# Patient Record
Sex: Male | Born: 1945
Health system: Southern US, Community
[De-identification: ages and names within clinical notes are randomized; demographics above are authoritative.]

## PROBLEM LIST (undated history)

## (undated) DIAGNOSIS — C61 Malignant neoplasm of prostate: Secondary | ICD-10-CM

## (undated) DIAGNOSIS — Z973 Presence of spectacles and contact lenses: Secondary | ICD-10-CM

## (undated) DIAGNOSIS — H409 Unspecified glaucoma: Secondary | ICD-10-CM

## (undated) DIAGNOSIS — I480 Paroxysmal atrial fibrillation: Secondary | ICD-10-CM

## (undated) DIAGNOSIS — E119 Type 2 diabetes mellitus without complications: Secondary | ICD-10-CM

## (undated) DIAGNOSIS — Z955 Presence of coronary angioplasty implant and graft: Secondary | ICD-10-CM

## (undated) DIAGNOSIS — M1990 Unspecified osteoarthritis, unspecified site: Secondary | ICD-10-CM

## (undated) DIAGNOSIS — R351 Nocturia: Secondary | ICD-10-CM

## (undated) DIAGNOSIS — I252 Old myocardial infarction: Secondary | ICD-10-CM

## (undated) HISTORY — PX: CORONARY ANGIOPLASTY: SHX604

## (undated) HISTORY — PX: CORONARY ANGIOPLASTY WITH STENT PLACEMENT: SHX49

---

## 1998-05-04 ENCOUNTER — Inpatient Hospital Stay (HOSPITAL_COMMUNITY): Admission: EM | Admit: 1998-05-04 | Discharge: 1998-05-06 | Payer: Self-pay | Admitting: Emergency Medicine

## 2000-11-22 ENCOUNTER — Observation Stay (HOSPITAL_COMMUNITY): Admission: EM | Admit: 2000-11-22 | Discharge: 2000-11-23 | Payer: Self-pay | Admitting: Emergency Medicine

## 2000-11-22 ENCOUNTER — Encounter: Payer: Self-pay | Admitting: Emergency Medicine

## 2000-12-12 ENCOUNTER — Encounter: Payer: Self-pay | Admitting: Cardiology

## 2000-12-12 ENCOUNTER — Ambulatory Visit (HOSPITAL_COMMUNITY): Admission: RE | Admit: 2000-12-12 | Discharge: 2000-12-12 | Payer: Self-pay | Admitting: Cardiology

## 2001-07-01 ENCOUNTER — Encounter: Payer: Self-pay | Admitting: Emergency Medicine

## 2001-07-01 ENCOUNTER — Inpatient Hospital Stay (HOSPITAL_COMMUNITY): Admission: EM | Admit: 2001-07-01 | Discharge: 2001-07-04 | Payer: Self-pay | Admitting: Emergency Medicine

## 2001-11-27 ENCOUNTER — Encounter: Payer: Self-pay | Admitting: Cardiology

## 2001-11-27 ENCOUNTER — Encounter: Admission: RE | Admit: 2001-11-27 | Discharge: 2001-11-27 | Payer: Self-pay | Admitting: Cardiology

## 2001-12-03 ENCOUNTER — Ambulatory Visit (HOSPITAL_COMMUNITY): Admission: RE | Admit: 2001-12-03 | Discharge: 2001-12-03 | Payer: Self-pay | Admitting: Cardiology

## 2001-12-04 ENCOUNTER — Inpatient Hospital Stay (HOSPITAL_COMMUNITY): Admission: EM | Admit: 2001-12-04 | Discharge: 2001-12-06 | Payer: Self-pay | Admitting: Emergency Medicine

## 2001-12-04 ENCOUNTER — Encounter: Payer: Self-pay | Admitting: Emergency Medicine

## 2002-05-06 ENCOUNTER — Encounter: Payer: Self-pay | Admitting: Cardiology

## 2002-05-06 ENCOUNTER — Ambulatory Visit (HOSPITAL_COMMUNITY): Admission: RE | Admit: 2002-05-06 | Discharge: 2002-05-06 | Payer: Self-pay | Admitting: Cardiology

## 2002-06-04 ENCOUNTER — Encounter (INDEPENDENT_AMBULATORY_CARE_PROVIDER_SITE_OTHER): Payer: Self-pay | Admitting: *Deleted

## 2002-06-04 ENCOUNTER — Ambulatory Visit (HOSPITAL_COMMUNITY): Admission: RE | Admit: 2002-06-04 | Discharge: 2002-06-04 | Payer: Self-pay | Admitting: General Surgery

## 2002-06-04 HISTORY — PX: OTHER SURGICAL HISTORY: SHX169

## 2002-09-10 ENCOUNTER — Ambulatory Visit (HOSPITAL_COMMUNITY): Admission: RE | Admit: 2002-09-10 | Discharge: 2002-09-11 | Payer: Self-pay | Admitting: Cardiology

## 2003-02-03 ENCOUNTER — Encounter: Payer: Self-pay | Admitting: General Practice

## 2003-02-03 ENCOUNTER — Encounter: Admission: RE | Admit: 2003-02-03 | Discharge: 2003-02-03 | Payer: Self-pay | Admitting: General Practice

## 2003-11-26 ENCOUNTER — Encounter: Admission: RE | Admit: 2003-11-26 | Discharge: 2003-11-26 | Payer: Self-pay | Admitting: General Practice

## 2006-06-07 ENCOUNTER — Ambulatory Visit (HOSPITAL_COMMUNITY): Admission: RE | Admit: 2006-06-07 | Discharge: 2006-06-07 | Payer: Self-pay | Admitting: Gastroenterology

## 2007-12-22 ENCOUNTER — Encounter: Admission: RE | Admit: 2007-12-22 | Discharge: 2007-12-22 | Payer: Self-pay | Admitting: Ophthalmology

## 2008-05-27 ENCOUNTER — Encounter: Admission: RE | Admit: 2008-05-27 | Discharge: 2008-05-27 | Payer: Self-pay | Admitting: Ophthalmology

## 2010-12-04 ENCOUNTER — Encounter: Payer: Self-pay | Admitting: Ophthalmology

## 2011-03-30 NOTE — Op Note (Signed)
NAMEJEFFREY, WURDEMAN              ACCOUNT NO.:  [id number]   MEDICAL RECORD NO.:  [id number]          PATIENT TYPE:  AMB   LOCATION:  ENDO                         FACILITY:  [facility]   PHYSICIAN:  James L. Rolla Flatten., M.D.DATE OF BIRTH:  Feb 27, 1946   DATE OF PROCEDURE:  06/07/2006  DATE OF DISCHARGE:                                 OPERATIVE REPORT   PROCEDURE:  Colonoscopy.   MEDICATIONS:  Fentanyl 75 mcg, Versed 7.5 mg IV.   INDICATIONS:  Previous history of large adenomatous colon polyp.   DESCRIPTION OF PROCEDURE:  Procedure explained to the patient and consent  obtained.  In the left lateral decubitus position, an Olympus scope was  inserted.  The prep was excellent.  We were able to reach the cecum without  difficulty.  Ileocecal valve and appendiceal orifice seen.  Scope withdrawn  through the cecum, ascending colon, transverse colon.  Descending and  sigmoid colon seen well.  No polyps or other lesions seen.  The rectum was  free of polyps; this was carefully examined in forward and retroflexed view.  Scope was withdrawn.   The patient tolerated the procedure well.   ASSESSMENT:  History of colon polyps, without any polyp seen at this time.   PLAN:  We will recommend repeating procedure in 5 years.           ______________________________  Joyice Faster Rolla Flatten., M.D.     Jaynie Bream  D:  06/07/2006  T:  06/08/2006  Job:  [id number]   cc:   [name], M.D.  Fax: [phone]   Allegra Lai. Terrence Dupont, M.D.  Fax: [phone]

## 2011-03-30 NOTE — Cardiovascular Report (Signed)
[place]. [facility]  Patient:    Corey Beard, Corey Beard Visit Number: [id number] MRN: [id number]          Service Type: MED Location: [id number] Attending Physician:  Clent Demark Dictated by:   Allegra Lai Terrence Dupont, M.D. Proc. Date: 12/04/01 Admit Date:  12/04/2001 Discharge Date: 12/06/2001   CC:         Cardiac Catheterization Laboratory  [name], M.D.   Cardiac Catheterization  PROCEDURES: 1. Left cardiac catheterization with selective left and right coronary    angiography, left ventriculography via the right groin using Judkins    technique. 2. Successful percutaneous transluminal coronary angioplasty to ostial of    diagonal #1 using 3.0 x 6 mm, long, Cutting Balloon. 3. Successful deployment of 3.0 x 8 mm, long, Zeta, Multi-Link stent in    ostium of diagonal #1. 4. Successful percutaneous transluminal coronary angioplasty to proximal    left anterior descending using 3.5 x 15 mm, long, CrossSail balloon.  INDICATIONS FOR PROCEDURE: The patient is a 65 year old, black male with a past medical history significant for coronary artery disease, status post inferior wall MI in June of 1999, status post PTCA and stenting to RCA and left circumflex in June of 1999, status post PTCA to diagonal #1, RCA, and PTCA and stenting to LAD in August of 2002. History of hypertension, morbid obesity, hypercholesterolemia, non-insulin dependent diabetes mellitus, controlled by diet. He came to the ER complaining of recurrent retrosternal, crushing, chest pain off and on for the last 2-3 weeks.  The patient has seen Dr. Montez Morita and was scheduled for Persantine Cardiolite today. He woke up with chest pain and cleaning the snow, developed severe chest pain radiating to the left arm associated with nausea and vomiting x1, grade 10/10, so desired to come to the ER. The patient received two sublingual nitroglycerin with relief of chest pain from 10 to 1/10  and was started on IV heparin and nitroglycerin drip with relief of chest pain. The patient states he was doing fine since his PTCA stenting in the last 2-3 weeks. Denies any palpitations, lightheadedness, or syncope. Denies PND, orthopnea, or leg swelling.  PAST MEDICAL HISTORY: As above.  PAST SURGICAL HISTORY: None.  ALLERGIES: No known drug allergies.  MEDICATIONS AT HOME: 1. Altace 10 mg p.o. q.d. 2. Tenormin 50 mg p.o. q.d. 3. Plavix 75 mg p.o. q.d. 4. Lipitor 10 mg p.o. q.d. 5. Enteric-coated aspirin 325 mg p.o. q.d. 6. Nitro-Dur 0.4 mg/h q.d. 7. Nitrostat sublingual p.r.n.  SOCIAL HISTORY: He is married and has one child, smoked one-third pack-per-day for 20+ years since August and recently started smoking again for the last few weeks. Drinks socially, occasionally practices medicine.  FAMILY HISTORY: Negative for coronary artery disease.  PHYSICAL EXAMINATION:  GENERAL: On examination, he was awake, alert, oriented x3 in no acute distress.  VITAL SIGNS: Blood pressure was 210/96, pulse was 94, regular.  HEENT: Conjunctiva was pink.  NECK: Supple, no JVD, no bruits.  LUNGS: Lungs are clear to auscultation without rhonchi or rales.  CARDIOVASCULAR: Cardiovascular examination: S1 and S2 were normal. There was as soft S4 gallop at the apex.  ABDOMEN: Soft, bowel sounds are present. Nontender.  EXTREMITIES: There was no clubbing, cyanosis or edema.  ECG showed normal sinus rhythm with septal Q waves and nonspecific T wave changes in the lateral leads.  Discussed with patient regarding various options of treatment, i.e., invasive left catheterization, possible PCI, stenting, its risks and benefits  versus stress testing. The patient consented for PCI.  DESCRIPTION OF PROCEDURE: After obtaining the informed consent, the patient was brought to the catheterization lab and was placed on the fluoroscopy table.  The right groin was prepped and draped in the usual  fashion. Xylocaine 2% was used for local anesthesia in the right groin. With the help of a thin-walled needle, a 6 French arterial sheath was placed. The sheath was aspirated and flushed. Next, a 66 French left Judkins catheter was advanced over the wire under fluoroscopic guidance up to the ascending aorta. The wire was pulled out, the catheter was aspirated and connected to the manifold. The catheter was further advanced and engaged into the left coronary ostium. Multiple views of the left system were taken. Next, the catheter was disengaged and was pulled out over the wire and was replaced with a 6 French right Judkins catheter, which was advanced over the wire under fluoroscopic guidance up to the ascending aorta. The wire was pulled out, the catheter was aspirated and connected to the manifold. The catheter was further advanced and engaged into the the right coronary ostium. Multiple views of the right system were taken. Next, the catheter was disengaged and was pulled out over the wire and was replaced with a 6 French pigtail catheter, which was advanced over the wire under fluoroscopic guidance up to the ascending aorta. The wire was pulled out. The catheter was aspirated and connected to the manifold. The catheter was further advanced across the aortic valve into the LV. LV pressures were recorded. Next, LV-graph was done in 30-degree RAO position. Post scintigraphic pressures were recorded from LV and then pullback pressures were recorded from the aorta. There was no gradient across the aortic valve. Next, the pigtail catheter was pulled out over the wire and the sheaths were aspirated and flushed.  FINDINGS: LV showed left ventricular systolic function, EF of [id number].  Left main was patent.  LAD had 70-75% in-stent re-stenosis. Diagonal #1 has 95% ostial stenosis.  The left circumflex has mild distal plaquing. OM-1 and OM-2 are patent.   RCA has 60-70% focal mid stenosis and  30-40% long tubular mid stenosis.  INTERVENTIONAL PROCEDURE: Successful PTCA to ostial of diagonal #1 was done using 2.0 x 6 mm, long, Cutting Balloon. Balloon ruptured after first inflation and then a 3.0 x 8 mm, long, Zeta, Multi-Link stent deployed at 8 atmospheres of pressure which was fully expanded going up to 9 atmospheres of pressure. The lesion was dilated from 95% to less than 10% residual with excellent TIMI grade 3 distal flow without evidence of dissection or distal embolization. Then successful PTCA to proximal LAD was done using 3.5 x 15 mm, long, CrossSail balloon, lesion dilated from 70-75% to less than 20%. Residual angiogram showed haziness in the proximal LAD and diagonal and then 3.0 x 10 mm, long, CrossSail and 3.5 x 15 mm, long, CrossSail balloon were used in diagonal #1 and LAD using kissing balloon technique. Both balloons were inflated at 2 and 4 atmospheres of pressure, respectively. Angiogram showed focal haziness in diagonal and then repeat PTCA to ostial of diagonal #1 with 3.0 x 10 mm CrossSail, and then 3.5 x 15 mm CrossSail balloon was done in diagonal and LAD. Lesions dilated from 95% to less than 10% residual in diagonal #1 and from 70% to less than 20% in LAD with excellent TIMI grade 3 distal flow in both the vessels. The patient received weight-based heparin, ReoPro, Plavix during the procedure. The  patient tolerated the procedure well. There were no complications. The patient was transferred to the recovery room in stable condition. Dictated by:   Allegra Lai Terrence Dupont, M.D. Attending Physician:  Clent Demark DD:  12/06/01 TD:  12/06/01 Job: [id number]

## 2011-03-30 NOTE — Discharge Summary (Signed)
[place]. [facility]  Patient:    Corey Beard, Corey Beard Visit Number: [id number] MRN: [id number]          Service Type: MED Location: [id number] Attending Physician:  Clent Demark Dictated by:   Allegra Lai Terrence Dupont, M.D. Adm. Date:  07/01/2001 Disc. Date: 07/04/2001   CC:         [name], M.D.   Discharge Summary  ADMISSION DIAGNOSES: 1. Non-Q wave myocardial infarction. 2. Coronary artery disease. 3. History of inferior wall myocardial infarction in June 1999. 4. Hypertension. 5. History of paroxysmal atrial fibrillation. 6. Tobacco abuse. 7. Hypercholesterolemia. 8. Morbid obesity. 9. Elevated blood sugar, rule out diabetes mellitus.  FINAL DIAGNOSES: 1. Non-Q wave myocardial infarction.  Status post percutaneous transluminal    coronary angioplasty/stenting to proximal left anterior descending.    Percutaneous transluminal coronary angioplasty to diagonal 1.  Percutaneous    transluminal coronary angioplasty to mid right coronary artery. 2. Coronary artery disease. 3. History of inferior wall myocardial infarction in June 1999. 4. Hypertension. 5. History of paroxysmal atrial fibrillation in the past. 6. Hypercholesterolemia. 7. Non-insulin-dependent diabetes mellitus controlled by diet. 8. Morbid obesity. 9. Tobacco abuse.  DISCHARGE MEDICATIONS: 1. Enteric-coated aspirin 325 mg 1 tablet daily. 2. Plavix 75 mg 1 tablet daily with food. 3. Atenolol 50 mg 1 tablet daily. 4. Altace 10 mg 1 capsule daily. 5. Nitro-Dur 0.4 mg per hour apply to chest wall in the a.m. and off at night. 6. Lipitor 10 mg 1 tablet daily. 7. Nitrostat 0.4 mg sublingual used as directed.  ACTIVITY:  Avoid heavy lifting, pushing, or pulling for 48 hours.  DIET:  Low-salt/low-cholesterol 1800 calorie ADA diet.  ______ and stent instructions have been given.  FOLLOW-UP:  Follow up with me in one week and Dr. Montez Morita in two weeks.  CONDITION ON  DISCHARGE:  Stable.  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  He came to the ER complaining of retrosternal crushing chest pain off and on for almost one month lasting 10-15 minutes with minimal exertion or walking to mailbox.  States for the last two days he has been having severe chest pain off and on lasting 20-30 minutes.  Yesterday, while taking the trash out, experienced chest pain, and today after eating lunch while in the office experienced chest pain lasting for 20-30 minutes relieved with rest.  The patient did not seek any medical attention until this evening and drove to the ER.  Presently, the patient denies any chest pain, nausea, vomiting, diaphoresis.  Denies palpitations, lightheadedness, or syncope.  PAST MEDICAL HISTORY:  As above.  PAST SURGICAL HISTORY:  None.  ALLERGIES:  None.  MEDICATIONS: 1. Atenolol 50 mg p.o. q.d. 2. Cardizem CD 240 mg p.o. q.d. 3. Enteric-coated aspirin 325 mg p.o. q.d. 4. Lipitor 10 mg p.o. q.d.  SOCIAL HISTORY:  He is married and has two children.  He smokes 1/3 pack per day for 20+ years.  Drinks alcohol socially.  Practices medicine.  FAMILY HISTORY:  Noncontributory for coronary artery disease.  PHYSICAL EXAMINATION:  GENERAL:  He was alert, awake, and oriented x 3 in no acute distress.  VITAL SIGNS:  Blood pressure 124/76, pulse 84 and regular.  HEENT:  Conjunctiva  was pink.  NECK:  Supple.  No JVD, no bruit.  LUNGS:  Clear to auscultation without rhonchi or rales.  CARDIOVASCULAR:  S1, S2 was normal.  There was no S3 gallop.  There was a soft S4 and soft systolic murmur present.  ABDOMEN:  Soft.  Bowel sounds were present.   Nontender.  EXTREMITIES:  There was no clubbing, cyanosis, or edema.  LABORATORY DATA:  His EKG showed normal sinus rhythm and nonspecific T-wave changes.  Chest x-ray showed thoracic spondylosis.  No active cardiopulmonary disease was apparent.  His CPK was 309, MB of 7.0, relative index of 2.3.  Second set:  CPK was 324, MB of 10.7, relative index of 3.3.  Third set:  CPK 288, MB of 6.9, relative index of 2.4.  Fourth set:  CPK 157, MB of 2.2, relative index 1.4.  Todays CPK is 132.  Troponin-I:  First set was elevated 0.78, second set was 3.64, third set 2.33, fourth set 1.35, fifth set is 0.63.  His cholesterol was 195, triglycerides 255, HDL 52, LDL 92.  His hemoglobin A1c was 7.2.  Electrolytes: Sodium 141, potassium 3.4, chloride 104, bicarb 24.  His blood sugar was 168. Fasting sugar the next day was 146.  Repeat fasting sugar on August 22 was 140.  BUN was 11, creatinine 1.1.  Today, BUN is 7 and creatinine 1.1.  His hemoglobin was 14.9, hematocrit 43.9, white count of 9.5, hemoglobin today is 12.9, hematocrit 37.4, which is stable.  Potassium is 3.4.  Glucose fasting was 133.  HOSPITAL COURSE:  The patient was admitted to CCU.  The patient ruled in for a non-Q wave MI by serial enzymes.  He had typical anginal pain.  The patient underwent PTCA and stenting to LAD and PTCA to diagonal and RCA, as per interventional procedure report.  The patient tolerated the procedure well. There were no complications.  The patient did not have any episodes of chest pain during the hospital stay.  Phase I cardiac rehab was called.  His groin is stable.  The patient has been ambulating in the hallway without any problems.  The patient, at this point, is not interested for Phase II cardiac rehab.  The patients cardiac enzymes have come back to normal range.  The patient will be discharged home on above medications and will be followed up by me in one week and thereafter by Dr.  Montez Morita.  Dictated by:   Allegra Lai Terrence Dupont, M.D. Attending Physician:  Clent Demark DD:  07/04/01 TD:  07/06/01 Job: [id number]

## 2011-03-30 NOTE — Cardiovascular Report (Signed)
[place]. [facility]  Patient:    Corey Beard, Corey Beard Visit Number: [id number] MRN: [id number]          Service Type: MED Location: [id number] Attending Physician:  Clent Demark Dictated by:   Allegra Lai Terrence Dupont, M.D. Proc. Date: 07/02/01 Adm. Date:  07/01/2001 Disc. Date: 07/04/2001   CC:         [name], M.D.  Cardiac Catheterization Laboratory   Cardiac Catheterization  PROCEDURES: 1. Left cardiac catheterization with selective left and right coronary    angiography, left ventriculography via right groin using Judkins technique. 2. Successful percutaneous transluminal coronary angioplasty to proximal    left anterior descending using 3.0 x 15 mm long CrossSail balloon. 3. Successful deployment of 3.5 x 18 mm long, Hepacoat, Bx Velocity stent    in proximal left anterior descending. 4. Successful percutaneous transluminal coronary angioplasty to ostium of    diagonal #1 using 3.0 x 10 mm long, CrossSail balloon. 5. Successful percutaneous transluminal coronary angioplasty to mid right    coronary artery using 3.0 x 15 mm long, Cutting Balloon.  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 Sherald Barge, history of hypertension, hypercholesterolemia, history of paroxysmal atrial fibrillation, morbid obesity, who came to the ER complainiNG of retrosternal pressure and chest pain off and on for almost one month, lasting 10-15 minutes with minimal exertion or walking to the mailbox or taking garbage out to the street.  He states that for the last two days he has had severe pain off and on lasting 20-30 minutes, yesterday when taking trash out experienced severe chest pain and today after  eating lunch while in office experienced chest pain lasting 20-30 minutes relieved with rest.  He did not seek any medical attention until this evening and drove to ER.  Presently, the patient denies any chest pain, nausea, vomiting, or diaphoresis.  Denies palpitations, lightheadedness or syncope.  Denies fever, chills or cough.  PAST MEDICAL HISTORY:  As above.  PAST SURGICAL HISTORY:  None.  ALLERGIES:  None.  MEDICATIONS:  He was on atenolol 50 mg p.o. q.d., Cardizem 240 p.o. q.d., enteric-coated aspirin 1 p.o. q.d., Lipitor 10 mg p.o. q.d.  SOCIAL HISTORY:  He is married and has two children.  Smokes one-third pack for 20+ years.  Drinks alcohol socially ______ actively ______.  FAMILY HISTORY:  Noncontributory for coronary artery disease.  PHYSICAL EXAMINATION:  On examination, he was awake, alert and oriented x3 in no acute distress.  Blood pressure was 124/76, pulse was 84.  Conjunctivae were pink.  Neck:  Supple, no JVD, no bruit.  Lungs are clear to auscultation with rhonchi or rales.  Cardiovascular examination: S1, S2 were normal.  There was a soft S4 gallop at the apex and soft systolic murmur at the apex. Abdomen:  Soft, bowel sounds are present, nontender.  Extremities:  There is no clubbing, cyanosis or edema.  His ECG showed normal sinus rhythm with nonspecific T wave changes.  His first set of CPK was already elevated.  CPK was 209, MB of 7.0, relative index of 2.3.  Troponin I, first set was also elevated.  It was 0.078.  The patient was admitted to CCU with a diagnosis of non-Q-wave MI and was started  on IV heparin, nitrates and beta blockers and antiplatelet medication. The patient did not have any episodes of chest pain during the hospital stay. Due to typical anginal pain, positive enzymes and multiple risk factors, the patient was advised for catheterization possible angioplasty.  DESCRIPTION OF PROCEDURE:  After obtaining the informed consent, the  patient was brought to the catheterization lab and was placed on the fluoroscopy table.  The right groin was prepped and draped in the usual fashion. Xylocaine 2% was used for local anesthesia in the right groin.  With the help of a thin-walled needle a 6 French arterial sheath was placed.  The sheath was aspirated and flushed.  Next, a 71 French left Judkins catheter was advanced over the wire under fluoroscopic guidance up to the ascending aorta.  The wire was pulled out, the catheter was aspirated and connected the manifold.  The catheter was further advanced and engaged into the left coronary ostium. Multiple views of the left system were taken.  Next, the catheter was disengaged and was pulled out over the wire and was replaced with a 6 French pigtail catheter which was advanced over the wire under fluoroscopic guidance up to the ascending aorta.  The wire was pulled out, the catheter was aspirated and connected to the manifold.  The catheter was further advanced and engaged into the right coronary ostium.  Multiple views of the right system were taken.  Next, the catheter was disengaged and was pulled out over the wire and was replaced with a 6 French pigtail catheter which was advanced over the wire under fluoroscopic guidance up to the ascending aorta.  The wire was pulled out, the catheter was aspirated and connected to the manifold.  The catheter was further advanced across the aortic valve into the LV.  LV pressures were recorded.  Next, LV-graphy was done in 30 degree RAO position. Postangiographic pressures were recorded from LV and then pullback pressures were recorded from the aorta.  There was no gradient across the aortic valve. Next, the pigtail catheter was pulled out over the wire.  The sheaths were aspirated and flushed.  FINDINGS:  LV showed mild inferior wall hypokinesis, ejection fraction of 50%.  Left main was patent.   LAD has 80-85% proximal complex  bifurcation stenosis with diagonal #1. Diagonal #1 has 20-30% ostial stenosis with post ______ aneurysmal dilatation.  Left circumflex has 10-15% distal stenosis.  OM-1 is moderate sized which is patent.  OM-2 is moderate sized which is patent.  The RCA has 90-95% in-stent long re-stenosis.  INTERVENTIONAL PROCEDURE:  Discussed with patient various options of treatment prior to PTCA, i.e., coronary artery bypass grafting versus PCI, its risks and benefits, i.e., death, MI, stroke, need for emergency CABG, risk of re-stenosis in the range of 40-50%, local vascular complications, etc.  The patient consented for PCI.  Successful PTCA of the proximal LAD was done using 3.0 x 15 mm long, CrossSail balloon using the above wire technique for pre-dilatation and then 3.5 x 18 mm long, Hepacoat stent Bx Velocity stent deployed at 10 atmospheres of pressure which was fully expanded going up to 11 atmospheres of pressure, lesion dilated from 90-95% and 0% residual with excellent TIMI grade 3 distal flow without evidence of dissection or distal embolization.  Angiogram showed 60-70% ostial diagonal #1 lesion.  Successful PTCA to ostial diagonal #1 was done using 3.0 x 10 mm long, CrossSail balloon, lesion dilated from 60-70% to less than 20% residual with excellent TIMI grade 3  distal flow without evidence of dissection or distal embolization.  Next, successful PTCA to mid RCA was done  using 3.0 x 15 mm long, Cutting Balloon, lesion dilated from 90-95% to less than 20% residual with excellent TIMI grade 3 distal flow without evidence of dissection or distal embolization.  The patient tolerated the procedure well.  There were no complications.  The patient received weight-based heparin, ReoPro during the procedure.  The patient was continued on aspirin and Plavix as before.  The patient was transferred to recovery room in stable condition. Dictated by:   Allegra Lai Terrence Dupont, M.D. Attending  Physician:  Clent Demark DD:  07/04/01 TD:  07/06/01 Job: [id number]

## 2011-03-30 NOTE — Cardiovascular Report (Signed)
NAME:  Corey Beard, Corey Beard                        ACCOUNT NO.:  [id number]   MEDICAL RECORD NO.:  [id number]                   PATIENT TYPE:  OIB   LOCATION:  6531                                 FACILITY:  [facility]   PHYSICIAN:  Mohan N. Terrence Dupont, M.D.              DATE OF BIRTH:  [birth year]   DATE OF PROCEDURE:  09/10/2002  DATE OF DISCHARGE:                              CARDIAC CATHETERIZATION   PROCEDURE:  1. Left cardiac catheterization with selective left and right coronary     angiography, left ventriculography via right groin using Judkins     technique.  2. Successful percutaneous transluminal coronary angioplasty to proximal     left anterior descending artery using 3.5 x 20-mm long CrossSail balloon.  3. Successful deployment of 3.5 x 28-mm long CYPHER drug-eluting stent in     proximal left anterior descending artery.   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  Subsequently had PTCA and stenting to proximal  LAD, PTCA to diagonal #1 and RCA in August 2002.  Subsequently had in-stent  restenosis to proximal LAD requiring PTCA and stenting to ostial of diagonal  #1 in January 2003.  History of hypertension, noninsulin-dependent diabetes  mellitus, hypercholesterolemia.  Past social history of alcohol abuse,  history of tobacco abuse.  Complained of retrosternal chest pain, described  as pressure, associated with exertion and relieved with rest and sublingual  nitroglycerin.  The patient denies any nausea, vomiting, or diaphoresis.  Denies rest pain or angina.  Denies PND, orthopnea, leg swelling.  Denies  palpitations, lightheadedness, or syncope.  The patient underwent Persantine  Cardiolite in June 2003 which showed focal scar in the anteroapical wall  with a small area of  ischemia in the anteroapical wall with an EF of 51%.  The patient opted initially for medical management as there was a small area  of ischemia in the anteroapical wall but due to recurrent progressive  exertional chest pain typical of angina, the patient consented for PCI.   DESCRIPTION OF PROCEDURE:  After obtaining informed consent, the patient was  brought to the catheterization lab and was placed on the fluoroscopy table.  The right groin was prepped and draped in the usual fashion.  Xylocaine, 2%,  was used for local anesthesia in the right groin.  With the help of a thin-  walled needle, a #6 French arterial sheath was placed.  The sheath was  aspirated and flushed.  Next a #6 [place] left Judkins catheter was advanced  over the wire under fluoroscopic guidance up to the ascending aorta.  The  wire was pulled out.  The catheter was aspirated and connected to the  manifold.  The catheter was further advanced and  engaged into the left  coronary ostium.  Multiple views of the left system were taken.  Next, the  catheter was disengaged and was pulled out over the wire and was replaced  with a #6 [place] right Judkins catheter which was advanced over the wire  under fluoroscopic guidance up to the ascending aorta.  The wire was pulled  out.  The catheter was aspirated and connected to the manifold.  The  catheter was further advanced and engaged into the left coronary ostium.  Multiple views of the left system were taken.  The catheter was engaged into  the right coronary ostium.  Multiple views of the right system were taken.  Next, the catheter was disengaged and was pulled out over the wire and was  replaced with a #6 French pigtail catheter which was advanced over the wire  under fluoroscopic guidance to the ascending aorta.  The wire was pulled  out.  The catheter was aspirated and connected to the manifold.  The  catheter was further advanced across the aortic valve into the LV.  LV   pressures were recorded.  Next, left ventriculography was done in 30-degree  RAO position.  Post angiographic pressures were recorded from the LV and  then pullback pressures were recorded from the aorta.  There was no gradient  across the aortic valve.  Next, the pigtail catheter was pulled out over the  wire.  Sheaths were aspirated and flushed.   FINDINGS:  1. LV showed mild anterolateral wall dyskinesia in the mid portion.  EF of     50-55%.  2. The left main was patent.  3. The LAD had 70-75% in-stent restenosis.  Diagonal #1 was 100% occluded.  4. The left circumflex had minimal plaquing.  OM-1 and OM-2 are patent.  5. The RCA had 40-50% mid stenosis as before.   INTERVENTIONAL PROCEDURE:  Successful PTCA to proximal LAD was done using  3.5 x 20-mm long CrossSail balloon for predilatation and then 3.5 x 28-mm  long CYPHER drug-eluting stent was deployed at 10 atmospheres of pressure  which was fully expanded going up to 13 atmospheres of pressure.  The lesion  was dilated from 70-75% to 0% residual with excellent TIMI grade 3 distal  flow without evidence of dissection or distal embolization.  The patient  received weight-based heparin, Integrilin, and a total of 300 mg of Plavix  during the procedure.  The patient tolerated the procedure well.  There were  no complications.  The patient was transferred to the recovery room in  stable condition.                                               Allegra Lai. Terrence Dupont, M.D.    MNH/MEDQ  D:  09/10/2002  T:  09/10/2002  Job:  [id number]   cc:   Cardiac Catheterization Lab

## 2011-03-30 NOTE — Op Note (Signed)
[place]  Patient:    Corey Beard, Corey Beard Visit Number: [id number] MRN: [id number]          Service Type: DSU Location: Center Line 05 Attending Physician:  Sherolyn Buba Dictated by:   Candee Furbish. Bubba Camp, M.D. Proc. Date: 06/04/02 Admit Date:  06/04/2002 Discharge Date: 06/04/2002                             Operative Report  PREOPERATIVE DIAGNOSES: 1. Incarcerated epigastric hernia. 2. Lymphadenopathy of the left occipitoparietal scalp.  POSTOPERATIVE DIAGNOSES: 1. Incarcerated epigastric hernia. 2. Lymphadenopathy of the left occipitoparietal scalp.  PROCEDURE: 1. Repair of ventral hernia with mesh. 2. Excision of mass of left occipitoparietal scalp.  SURGEON:  Candee Furbish. Bubba Camp, M.D.  ASSISTANT:  Nurse.  ANESTHESIA:  General.  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  This has been causing some degree of discomfort.  He comes now for repair of this incarcerated hernia after the risks and potential benefits of surgery had been fully discussed, all questions answered and consent obtained.  Additionally, the patient has a large mobile lymph node just above the mastoid in the occipitoparietal area of the left side of the scalp for which he requests removal.  PROCEDURE:  Following the induction of satisfactory general anesthesia with the patient positioned supinely, the abdomen was doubly prepped and draped to be included in the sterile operative field.  I made a vertical incision over the mass deepening this through the skin and subcutaneous tissues carrying the dissection down to a large incarcerated hernia sac.  This was dissected free in all sides down to the fascia.  The fascia was scored and opened so as to deliver the mass.  The mass of the entire sac was incarcerated with omentum.  The incarcerated omentum was divided between clamps and  secured with ties of 2-0 silk and the sac along with the incarcerated omentum was forwarded for pathologic evaluation.  Hemostasis was assured on the remaining omentum which was allowed to retract back into the abdominal cavity.  Within the abdominal cavity, I then placed a Seprafilm slurry to prevent adhesions.  I then placed a medium polypropylene mesh plug which was sewn in with 0 Novofil sutures completing the hernial repair.  The repair was noted to be intact.  Sponge, instrument and sharp counts were doubly verified.  The subcutaneous tissues were closed over the mesh with interrupted 2-0 Vicryl sutures.  The skin was closed with a running 4-0 monocryl.  The wound was reinforced with Steri-Strips.  Attention was then turned to the mass on the scalp where his head was turned to the right and the region just above and posterior to the ear prepped and draped to be included in the sterile operative field.  I infiltrated this area with 0.5% Marcaine with epinephrine 1:200,000 and then made a transverse incision over the mass deepening through the scalp and subcutaneous tissues down to the mass.  The mass was then dissected free on all sides maintaining hemostasis with electrocautery.  It was removed in its entirety and sent fresh to pathology.  Hemostasis was then assured.  Sponge, instrument and sharp counts were again verified.  Subcutaneous tissues closed with interrupted 3-0 Vicryl sutures and skin closed with running 4-0 monocryl.  This was reinforced with Steri-Strips and sterile dressings applied.  Anesthesia reversed.  The patient  was moved from the operating room to the recovery room in stable condition.  He tolerated the procedure well. Dictated by:   Candee Furbish. Bubba Camp, M.D. Attending Physician:  Sherolyn Buba DD:  06/04/02 TD:  06/07/02 Job: [id number]

## 2011-03-30 NOTE — Discharge Summary (Signed)
[place]  Patient:    Corey Beard, Corey Beard Visit Number: [id number] MRN: [id number]          Service Type: MED Location: [id number] Attending Physician:  Clent Demark Dictated by:   Allegra Lai Terrence Dupont, M.D. Admit Date:  12/04/2001 Discharge Date: 12/06/2001   CC:         [name], M.D.   Discharge Summary  ADMITTING DIAGNOSES: 1. Unstable angina, rule out myocardial infarction, rule out restenosis. 2. Coronary artery disease, status post PTCA and stenting to left anterior    descending artery and PTCA to right coronary artery and diagonal 1    vessels in August of 2002. a. History of myocardial infarction x2 in June of    1999 and August 2002. 3. Uncontrolled hypertension. 4. Non-insulin dependent diabetes mellitus controlled by diet. 5. Tobacco abuse. 6. Morbid obesity. 7. Hypercholesterolemia. 8. History of paroxysmal atrial fibrillation.  FINAL DIAGNOSES: 1. Status post small non-Q-wave myocardial infarction, status post PTCA and    stenting to diagonal 1 and PTCA to left anterior descending artery. 2. Coronary artery disease, status post PTCA and stenting to left anterior    descending artery and PTCA to right coronary artery and diagonal 1 in    August of 2002. 3. History of myocardial infarction x2 in the past, had PTCA and stenting to    right coronary artery and PTCA to left circumflex in June of 1999. 4. Uncontrolled hypertension. 5. Non-insulin dependent diabetes mellitus. 6. Morbid obesity. 7. Hypercholesterolemia. 8. Tobacco abuse. 9. History of paroxysmal atrial fibrillation.  MEDICATIONS: 1. Atenolol 50 mg one tablet daily. 2. Altace 10 mg one capsule twice daily. 3. Norvasc 5 mg one tablet daily. 4. Nitro-Dur 0.4 mg, apply to chest wall in the a.m., off at night. 5. Glyburide 75 mg one tablet daily with food. 6. Enteric-coated aspirin 325 mg one tablet daily. 7. Lipitor 20 mg one tablet daily. 8.  Actos 15 mg one tablet daily in the morning. 9. Nitrostat 0.4 mg sublingual use as directed.  ACTIVITY:  Avoid heavy lifting, pushing, or pulling for 48 hours.  DIET:  Low salt, low cholesterol, 1800 calories, ADA diet.  SPECIAL INSTRUCTIONS:  Post angioplasty and stent instructions have been given.  The patient has been advised to monitor blood sugar.  FOLLOW-UP:  Follow up with me in one week and Dr. Montez Morita in two weeks.  CONDITION ON DISCHARGE:  Stable.  BRIEF HISTORY/HOSPITAL COURSE:  The patient is a 65 year old black man with past medical history significant for coronary artery disease, status post inferior wall MI in June of 1999, status post PTCA and stenting to RCA and left circumflex in 1999, status post PTCA to diagonal 1 and RCA and PTCA and stenting to LAD in August of 2002.  He came to the ER with history of hypertension, morbid obesity, hypercholesterolemia, and history of paroxysmal atrial fibrillation, history of tobacco abuse, non-insulin dependent diabetes mellitus controlled by diet.  He came to the ER complaining of recurrent retrosternal crushing chest pain off and on for the last 2-3 weeks. He has seen Dr. Montez Morita and was sent in for Persantine Cardiolite today.  The patient woke up with chest pain and while cleaning snow, developed severe crushing chest pain radiating to the left arm associated with nausea and vomiting x1.  Pain was rated 10/10.  He took two sublingual nitroglycerin with partial relief and decided to come to the ER.  The patient was started  on IV heparin and nitrates with relief of chest pain.  The patient states he was doing fine until PTCA and stenting until last 2-3 weeks.  He denies any palpation, lightheadedness, or syncope.  He denies pain, PND, orthopnea or leg swelling.  PAST MEDICAL HISTORY:  As above.  PAST SURGICAL HISTORY:  None.  ALLERGIES:  None.  MEDICATIONS AT HOME: 1. He was on Altace 10 mg p.o. q.d. 2. Tenormin 50 mg  p.o. q.d. 3. Plavix 75 mg p.o. q.d. 4. Lipitor 10 mg p.o. q.d. 5. Enteric-coated aspirin 325 mg p.o. q.d. 6. Nitro-Dur 0.4 mg per hour q.d. 7. Nitrostat sublingual p.r.n.  SOCIAL HISTORY:  He is married and has one child.  He smokes one third pack per day for 20+ years.  He quit in August of 2002.  He states recently again he started smoking in the last two weeks.  He drinks socially occasionally and practices medicine.  FAMILY HISTORY:  Negative for coronary artery disease.  PHYSICAL EXAMINATION:  GENERAL:  He was alert, cooperative, oriented x 3, in no acute distress.  VITAL SIGNS:  Blood pressure was 210/96, pulse was 94 and regular.  HEENT:  Conjunctivae were pink.  NECK:  Supple, no JVD, no bruits.  LUNGS:  Clear to auscultation without rhonchi or rales.  CARDIOVASCULAR:  S1, S2 was normal.  There was soft S4 gallop at the apex.  ABDOMEN:  Soft, bowel sounds are present.  Nontender.  EXTREMITIES:  There was no clubbing, cyanosis, or edema.  LABORATORY:  His EKG showed normal sinus rhythm with septal Q-waves and nonspecified T-wave changes in the lateral lead.  Repeat EKG showed normal sinus rhythm with MVH and septal Q-waves and nonspecified T-wave changes in the lateral leads.  Repeat EKG on January 24 showed normal sinus rhythm. Impression was improved in the anterior leads and T-wave inversion in the lateral leads.  Chest x-ray showed cardiomegaly, no active disease.  His hemoglobin A1c was 7.6.  Cholesterol was 163, LDL was 65.  Actually there were two lipid panels.  The first lipid panel, cholesterol was 192, triglyceride 97, LDL of 115.  Second set was total cholesterol 163, LDL 65, HDL of 36.  Cardiac enzymes, first set CPK was 160, MB of 1.8, relative index of 1.1.  Second set, CPK was 205, MB of 8.4, and relative index 4.5, third set total CPK is 107, MB of 2.1.  Troponin I first set was 0.04, second set was  1.99, third set is 0.99.  His sodium was 136,  potassium was 4.0, chloride 103, blood sugar was 257, repeat blood sugar was 153, BUN 13, creatinine 0.8.  Hemoglobin was 14.6, hematocrit 42.2, white count of 10.9.  Repeat hemoglobin on January 24 was 11.7, hematocrit 34.2, white count of 8.6. Hemoglobin today is 12, hematocrit 35, white count of 7.2.  Platelet count is 244.  BRIEF HOSPITAL COURSE:  The patient was directly taken to the cath lab from the emergency room and had PTCA and stenting to diagonal 1 and PTCA to LAD as per procedure report.  The patient tolerated procedure well.  There were no complications.  The patient was transferred to postoperative recovery unit. The sheaths were pulled out the same day.  The patient did not have any source of chest pain during the hospital stay and his groin remained stable.  The patient has been ambulating in the hallway without any problems.  The patient did rule in for small non-Q-wave MI due to very mildly  elevated CPKs and troponin.  The patient stayed in the hospital overnight.  The patient was pain free since his hospital admission.  Phase 1 of cardiac rehab was called.  The patient at this point is not interested for phase 2 of cardiac rehab.  The patient will be discharged home on the above medications and will be followed up in my office in one week and by Dr. Montez Morita in two weeks.  The patient has been advised to go for Persantine Cardiolite due to treatments for follow-up of focal RCA lesion. Dictated by:   Allegra Lai Terrence Dupont, M.D. Attending Physician:  Clent Demark DD:  12/06/01 TD:  12/07/01 Job: [id number]

## 2011-07-16 ENCOUNTER — Emergency Department (HOSPITAL_COMMUNITY)
Admission: EM | Admit: 2011-07-16 | Discharge: 2011-07-16 | Discharge status: Against Medical Advice | Payer: BC Managed Care – PPO | Attending: Emergency Medicine | Admitting: Emergency Medicine

## 2011-07-16 DIAGNOSIS — F1010 Alcohol abuse, uncomplicated: Secondary | ICD-10-CM | POA: Insufficient documentation

## 2011-07-16 DIAGNOSIS — R5383 Other fatigue: Secondary | ICD-10-CM | POA: Insufficient documentation

## 2011-07-16 DIAGNOSIS — R5381 Other malaise: Secondary | ICD-10-CM | POA: Insufficient documentation

## 2011-07-16 DIAGNOSIS — I10 Essential (primary) hypertension: Secondary | ICD-10-CM | POA: Insufficient documentation

## 2011-07-16 DIAGNOSIS — E7800 Pure hypercholesterolemia, unspecified: Secondary | ICD-10-CM | POA: Insufficient documentation

## 2011-07-16 DIAGNOSIS — R55 Syncope and collapse: Secondary | ICD-10-CM | POA: Insufficient documentation

## 2011-07-16 DIAGNOSIS — Z79899 Other long term (current) drug therapy: Secondary | ICD-10-CM | POA: Insufficient documentation

## 2011-07-16 DIAGNOSIS — I252 Old myocardial infarction: Secondary | ICD-10-CM | POA: Insufficient documentation

## 2011-07-16 DIAGNOSIS — R4789 Other speech disturbances: Secondary | ICD-10-CM | POA: Insufficient documentation

## 2011-07-16 DIAGNOSIS — E119 Type 2 diabetes mellitus without complications: Secondary | ICD-10-CM | POA: Insufficient documentation

## 2011-07-16 LAB — DIFFERENTIAL
Basophils Absolute: 0 K/uL (ref 0.0–0.1)
Basophils Relative: 0 % (ref 0–1)
Eosinophils Absolute: 0.1 10*3/uL (ref 0.0–0.7)
Eosinophils Relative: 1 % (ref 0–5)
Lymphocytes Relative: 25 % (ref 12–46)
Lymphs Abs: 2.5 10*3/uL (ref 0.7–4.0)
Monocytes Absolute: 0.6 10*3/uL (ref 0.1–1.0)
Monocytes Relative: 6 % (ref 3–12)
Neutro Abs: 6.7 K/uL (ref 1.7–7.7)
Neutrophils Relative %: 68 % (ref 43–77)

## 2011-07-16 LAB — BASIC METABOLIC PANEL WITH GFR
Chloride: 100 meq/L (ref 96–112)
GFR calc Af Amer: >60 mL/min (ref >60)
Potassium: 3.8 meq/L (ref 3.5–5.1)

## 2011-07-16 LAB — BASIC METABOLIC PANEL
BUN: 15 mg/dL (ref 6–23)
CO2: 25 mEq/L (ref 19–32)
Calcium: 9 mg/dL (ref 8.4–10.5)
Creatinine, Ser: 1.28 mg/dL (ref 0.50–1.35)
GFR calc non Af Amer: 57 mL/min — ABNORMAL LOW (ref >60)
Glucose, Bld: 78 mg/dL (ref 70–99)
Sodium: 138 mEq/L (ref 135–145)

## 2011-07-16 LAB — CBC
HCT: 37 % — ABNORMAL LOW (ref 39.0–52.0)
Hemoglobin: 12.9 g/dL — ABNORMAL LOW (ref 13.0–17.0)
MCH: 29.8 pg (ref 26.0–34.0)
MCHC: 34.9 g/dL (ref 30.0–36.0)
MCV: 85.5 fL (ref 78.0–100.0)
Platelets: 332 10*3/uL (ref 150–400)
RBC: 4.33 MIL/uL (ref 4.22–5.81)
RDW: 16.9 % — ABNORMAL HIGH (ref 11.5–15.5)
WBC: 9.9 10*3/uL (ref 4.0–10.5)

## 2015-01-26 DIAGNOSIS — E0590 Thyrotoxicosis, unspecified without thyrotoxic crisis or storm: Secondary | ICD-10-CM | POA: Diagnosis not present

## 2015-01-26 DIAGNOSIS — H25813 Combined forms of age-related cataract, bilateral: Secondary | ICD-10-CM | POA: Diagnosis not present

## 2015-01-26 DIAGNOSIS — H4011X2 Primary open-angle glaucoma, moderate stage: Secondary | ICD-10-CM | POA: Diagnosis not present

## 2015-02-24 DIAGNOSIS — R351 Nocturia: Secondary | ICD-10-CM | POA: Diagnosis not present

## 2015-02-24 DIAGNOSIS — R972 Elevated prostate specific antigen [PSA]: Secondary | ICD-10-CM | POA: Diagnosis not present

## 2015-02-24 DIAGNOSIS — R350 Frequency of micturition: Secondary | ICD-10-CM | POA: Diagnosis not present

## 2015-02-24 DIAGNOSIS — N401 Enlarged prostate with lower urinary tract symptoms: Secondary | ICD-10-CM | POA: Diagnosis not present

## 2015-03-23 ENCOUNTER — Other Ambulatory Visit: Payer: Self-pay | Admitting: *Deleted

## 2015-03-23 ENCOUNTER — Ambulatory Visit
Admission: RE | Admit: 2015-03-23 | Discharge: 2015-03-23 | Disposition: A | Discharge status: Home / Self Care | Payer: No Typology Code available for payment source | Source: Ambulatory Visit | Attending: *Deleted | Admitting: *Deleted

## 2015-03-23 DIAGNOSIS — R7611 Nonspecific reaction to tuberculin skin test without active tuberculosis: Secondary | ICD-10-CM

## 2015-06-07 DIAGNOSIS — C61 Malignant neoplasm of prostate: Secondary | ICD-10-CM | POA: Diagnosis not present

## 2015-06-07 DIAGNOSIS — R972 Elevated prostate specific antigen [PSA]: Secondary | ICD-10-CM | POA: Diagnosis not present

## 2015-06-14 DIAGNOSIS — C61 Malignant neoplasm of prostate: Secondary | ICD-10-CM | POA: Diagnosis not present

## 2015-07-05 ENCOUNTER — Encounter: Payer: Self-pay | Admitting: Radiation Oncology

## 2015-07-05 NOTE — Progress Notes (Signed)
GU Location of Tumor / Histology: Adenocarcinoma of the Prostate   If Prostate Cancer, Gleason Score is ( + ) and PSA is (9.83)  Corey Beard presented:    Past/Anticipated interventions by urology, if any: Dr. Lowella Bandy - Biopsy of Prostate   Past/Anticipated interventions by medical oncology, if any: Unknown  Weight changes, if any: Reports 60 lbs in last 2-3 years  Bowel/Bladder complaints, if any: Nocturia x 2-3, Frequency and urgency  Nausea/Vomiting, if any:  No  Pain issues, if any: No   SAFETY ISSUES:  Prior radiation? No  Pacemaker/ICD? No  Possible current pregnancy? N/A  Is the patient on methotrexate?No  Current Complaints / other details:

## 2015-07-06 ENCOUNTER — Encounter: Payer: Self-pay | Admitting: Radiation Oncology

## 2015-07-06 ENCOUNTER — Ambulatory Visit
Admission: RE | Admit: 2015-07-06 | Discharge: 2015-07-06 | Disposition: A | Discharge status: Home / Self Care | Payer: Medicare Other | Source: Ambulatory Visit | Attending: Radiation Oncology | Admitting: Radiation Oncology

## 2015-07-06 VITALS — BP 153/79 | HR 51 | Temp 98.4°F | Ht 72.0 in | Wt 209.4 lb

## 2015-07-06 DIAGNOSIS — E119 Type 2 diabetes mellitus without complications: Secondary | ICD-10-CM | POA: Diagnosis not present

## 2015-07-06 DIAGNOSIS — C61 Malignant neoplasm of prostate: Secondary | ICD-10-CM | POA: Diagnosis not present

## 2015-07-06 DIAGNOSIS — I252 Old myocardial infarction: Secondary | ICD-10-CM | POA: Diagnosis not present

## 2015-07-06 DIAGNOSIS — I10 Essential (primary) hypertension: Secondary | ICD-10-CM | POA: Diagnosis not present

## 2015-07-06 DIAGNOSIS — I4891 Unspecified atrial fibrillation: Secondary | ICD-10-CM | POA: Diagnosis not present

## 2015-07-06 DIAGNOSIS — F17210 Nicotine dependence, cigarettes, uncomplicated: Secondary | ICD-10-CM | POA: Insufficient documentation

## 2015-07-06 HISTORY — DX: Malignant neoplasm of prostate: C61

## 2015-07-06 NOTE — Progress Notes (Addendum)
[facility] NEW PATIENT EVALUATION  Name: Corey Beard MRN: [id number]  Date:   07/06/2015           DOB: [birth year]  Status: outpatient   CC: No primary care provider on file.  Corey Bandy, MD    REFERRING PHYSICIAN: Lowella Bandy, MD   DIAGNOSIS:  Stage TIc favorable risk adenocarcinoma prostate   HISTORY OF PRESENT ILLNESS:  Corey Beard is a 69 y.o. male who is  Seen today through the courtesy of Dr. Janice Norrie for consideration of radiation therapy in the management of his stage TIc favorable risk adenocarcinoma prostate. His PSA was 7.5 this past March when up to 9.83 this past April with a 8% free PSA. Dr. Janice Norrie performed ultrasound-guided biopsies on 06/07/2015.  He was found have Gleason 6 (3+3) involving less than 5% of one core from the left lateral base, 10% of one core from the left base, and 10% of one core from the right lateral apex. His gland volume was 61 mL. He is doing well from a GU and GI standpoint.  His I PSS score is 7 -8. He is potent.  PREVIOUS RADIATION THERAPY: No   PAST MEDICAL HISTORY:  has a past medical history of Atrial fibrillation; Cardiac disorder; Diabetes mellitus; Glaucoma; Hypertension; Myocardial infarction; and Prostate cancer.     PAST SURGICAL HISTORY:  Past Surgical History  Procedure Laterality Date  . Cardiac catheterization    . Umbilical hernia repair       FAMILY HISTORY: family history includes Colon cancer in his mother.   His mother died at age 84 from colon cancer.  His father died from Parkinson's disease/or lesions 60. No family history of prostate cancer.   SOCIAL HISTORY:  reports that he has been smoking.  He does not have any smokeless tobacco history on file. He reports that he does not drink alcohol or use illicit drugs.   Married , 2 children.  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   ALLERGIES: Review of patient's allergies indicates no active allergies.   MEDICATIONS:  Current  Outpatient Prescriptions  Medication Sig Dispense Refill  . Amlodipine-Olmesartan (AZOR PO) Take by mouth.    Marland Kitchen aspirin 81 MG tablet Take 81 mg by mouth daily.    Marland Kitchen atenolol (TENORMIN) 50 MG tablet Take 50 mg by mouth daily.    . sitaGLIPtin-metformin (JANUMET) 50-1000 MG per tablet Take 1 tablet by mouth daily.     No current facility-administered medications for this encounter.     REVIEW OF SYSTEMS:  Pertinent items are noted in HPI.    PHYSICAL EXAM:  height is 6' (1.829 m) and weight is 209 lb 6.4 oz (94.983 kg). His temperature is 98.4 F (36.9 C). His blood pressure is 153/79 and his pulse is 51.    alert and oriented  69 year old after American male appearing his stated age.  Rectal examination: Prostate gland is slightly enlarged and is without focal induration or nodularity.   LABORATORY DATA:  Lab Results  Component Value Date   WBC 9.9 07/16/2011   HGB 12.9* 07/16/2011   HCT 37.0* 07/16/2011   MCV 85.5 07/16/2011   PLT 332 07/16/2011   Lab Results  Component Value Date   NA 138 07/16/2011   K 3.8 07/16/2011   CL 100 07/16/2011   CO2 25 07/16/2011   No results found for: ALT, AST, GGT, ALKPHOS, BILITOT   PSA 9.83 from 02/24/2015   IMPRESSION:  Stage  TIc favorable risk adenocarcinoma prostate.  I explained to Dr. Amadeo Garnet  That his prognosis is related to his stage, PSA level, and Gleason score.  All are favorable.  Other prognostic factors include PSA doubling time and disease volume.  He has low disease volume. We discussed the fact that he has multiple options.  These include surgery, active surveillance , or radiation therapy.  Radiation therapy options include seed implantation alone or external beam/IMRT. We spent a good deal of time discussing each radiation therapy option including radiation safety issues with seed implantation. We discussed the potential acute and late toxicities of radiation therapy.  His prognosis is favorable. He'll go home and think things  over and contact me if he wants to proceed with radiation therapy. I explained to him that he would need a CT arch study if he wants to proceed with seed implantation.  If his gland is too large then he may need to be downsized with 3 months of androgen deprivation therapy.   PLAN:  As discussed above.  I spent 45 minutes face to face with the patient and more than 50% of that time was spent in counseling and/or coordination of care.

## 2015-07-07 ENCOUNTER — Encounter: Payer: Self-pay | Admitting: Radiation Oncology

## 2015-07-07 NOTE — Progress Notes (Signed)
CC: Dr. Lowella Bandy  Dr. Amadeo Garnet called today and he would like to proceed with seed implantation.  I will have him come in next week for a CT arch study.

## 2015-07-12 DIAGNOSIS — I2510 Atherosclerotic heart disease of native coronary artery without angina pectoris: Secondary | ICD-10-CM | POA: Diagnosis not present

## 2015-07-12 DIAGNOSIS — N529 Male erectile dysfunction, unspecified: Secondary | ICD-10-CM | POA: Diagnosis not present

## 2015-07-12 DIAGNOSIS — E119 Type 2 diabetes mellitus without complications: Secondary | ICD-10-CM | POA: Diagnosis not present

## 2015-07-12 DIAGNOSIS — E785 Hyperlipidemia, unspecified: Secondary | ICD-10-CM | POA: Diagnosis not present

## 2015-07-12 DIAGNOSIS — I10 Essential (primary) hypertension: Secondary | ICD-10-CM | POA: Diagnosis not present

## 2015-07-12 DIAGNOSIS — Z8546 Personal history of malignant neoplasm of prostate: Secondary | ICD-10-CM | POA: Diagnosis not present

## 2015-07-26 ENCOUNTER — Ambulatory Visit
Admission: RE | Admit: 2015-07-26 | Discharge: 2015-07-26 | Disposition: A | Discharge status: Home / Self Care | Payer: Medicare Other | Source: Ambulatory Visit | Attending: Radiation Oncology | Admitting: Radiation Oncology

## 2015-07-26 VITALS — Wt 209.3 lb

## 2015-07-26 DIAGNOSIS — C61 Malignant neoplasm of prostate: Secondary | ICD-10-CM | POA: Diagnosis not present

## 2015-07-26 NOTE — Progress Notes (Addendum)
CC: Dr. Lowella Bandy  Complex simulation/treatment planning note:   The patient visits [place] today for his CT arch study.  He was taken to the CT simulator.  His pelvis was scanned.  The CT data set was sent to the planning system where I contoured his prostate.  The prostate was projected over the pubic arch and the pubic arch is open.  His prostate volume is measured to be 65.4 mL were Dr. Janice Norrie measures 61 mL, a good correlation.  Consent is signed today.  He'll be implanted with the [employer] system.  I prescribing 14,500 cGy utilizing I-125 seeds.  Dr. Janice Norrie will assign one of his associates to assist with his seed implant.

## 2015-08-04 DIAGNOSIS — H25813 Combined forms of age-related cataract, bilateral: Secondary | ICD-10-CM | POA: Diagnosis not present

## 2015-08-04 DIAGNOSIS — E0590 Thyrotoxicosis, unspecified without thyrotoxic crisis or storm: Secondary | ICD-10-CM | POA: Diagnosis not present

## 2015-08-04 DIAGNOSIS — H4011X2 Primary open-angle glaucoma, moderate stage: Secondary | ICD-10-CM | POA: Diagnosis not present

## 2015-08-08 ENCOUNTER — Telehealth: Payer: Self-pay | Admitting: *Deleted

## 2015-08-08 ENCOUNTER — Other Ambulatory Visit: Payer: Self-pay | Admitting: Urology

## 2015-08-08 NOTE — Telephone Encounter (Signed)
CALLED PATIENT TO INFORM OF IMPLANT DATE, SPOKE WITH PATIENT AND HE IS AWARE OF THIS DATE 

## 2015-08-10 ENCOUNTER — Other Ambulatory Visit: Payer: Self-pay

## 2015-08-10 ENCOUNTER — Encounter (HOSPITAL_BASED_OUTPATIENT_CLINIC_OR_DEPARTMENT_OTHER)
Admission: RE | Admit: 2015-08-10 | Discharge: 2015-08-10 | Disposition: A | Discharge status: Home / Self Care | Payer: Medicare Other | Source: Ambulatory Visit | Attending: Urology | Admitting: Urology

## 2015-08-10 DIAGNOSIS — R9431 Abnormal electrocardiogram [ECG] [EKG]: Secondary | ICD-10-CM | POA: Insufficient documentation

## 2015-08-10 DIAGNOSIS — C61 Malignant neoplasm of prostate: Secondary | ICD-10-CM | POA: Diagnosis not present

## 2015-08-11 ENCOUNTER — Telehealth: Payer: Self-pay | Admitting: *Deleted

## 2015-08-11 NOTE — Telephone Encounter (Signed)
CALLED PATIENT TO INFORM OF IMPLANT CHANGING FROM 09-27-15 TO 10-04-15, SPOKE WITH PATIENT AND HE IS AWARE OF THIS CHANGE.

## 2015-09-14 DIAGNOSIS — C61 Malignant neoplasm of prostate: Secondary | ICD-10-CM | POA: Diagnosis not present

## 2015-09-26 ENCOUNTER — Telehealth: Payer: Self-pay | Admitting: *Deleted

## 2015-09-26 NOTE — Telephone Encounter (Signed)
CALLED PATIENT TO REMIND TO GETS LAB FOR PROCEDURE ON 10-04-15, LVM FOR A RETURN CALL

## 2015-09-27 ENCOUNTER — Encounter (HOSPITAL_BASED_OUTPATIENT_CLINIC_OR_DEPARTMENT_OTHER): Payer: Self-pay | Admitting: *Deleted

## 2015-09-27 DIAGNOSIS — Z7984 Long term (current) use of oral hypoglycemic drugs: Secondary | ICD-10-CM | POA: Diagnosis not present

## 2015-09-27 DIAGNOSIS — C61 Malignant neoplasm of prostate: Secondary | ICD-10-CM | POA: Diagnosis not present

## 2015-09-27 DIAGNOSIS — I4891 Unspecified atrial fibrillation: Secondary | ICD-10-CM | POA: Diagnosis not present

## 2015-09-27 DIAGNOSIS — I2510 Atherosclerotic heart disease of native coronary artery without angina pectoris: Secondary | ICD-10-CM | POA: Diagnosis not present

## 2015-09-27 DIAGNOSIS — C679 Malignant neoplasm of bladder, unspecified: Secondary | ICD-10-CM | POA: Diagnosis not present

## 2015-09-27 DIAGNOSIS — M1990 Unspecified osteoarthritis, unspecified site: Secondary | ICD-10-CM | POA: Diagnosis not present

## 2015-09-27 DIAGNOSIS — I10 Essential (primary) hypertension: Secondary | ICD-10-CM | POA: Diagnosis not present

## 2015-09-27 DIAGNOSIS — F17200 Nicotine dependence, unspecified, uncomplicated: Secondary | ICD-10-CM | POA: Diagnosis not present

## 2015-09-27 DIAGNOSIS — H409 Unspecified glaucoma: Secondary | ICD-10-CM | POA: Diagnosis not present

## 2015-09-27 DIAGNOSIS — Z7982 Long term (current) use of aspirin: Secondary | ICD-10-CM | POA: Diagnosis not present

## 2015-09-27 DIAGNOSIS — Z79899 Other long term (current) drug therapy: Secondary | ICD-10-CM | POA: Diagnosis not present

## 2015-09-27 DIAGNOSIS — I252 Old myocardial infarction: Secondary | ICD-10-CM | POA: Diagnosis not present

## 2015-09-27 DIAGNOSIS — E119 Type 2 diabetes mellitus without complications: Secondary | ICD-10-CM | POA: Diagnosis not present

## 2015-09-27 LAB — COMPREHENSIVE METABOLIC PANEL
ALK PHOS: 83 U/L (ref 38–126)
ALT: 17 U/L (ref 17–63)
ANION GAP: 8 (ref 5–15)
AST: 17 U/L (ref 15–41)
Albumin: 4.4 g/dL (ref 3.5–5.0)
BILIRUBIN TOTAL: 0.6 mg/dL (ref 0.3–1.2)
BUN: 17 mg/dL (ref 6–20)
CALCIUM: 9.8 mg/dL (ref 8.9–10.3)
CO2: 28 mmol/L (ref 22–32)
Chloride: 103 mmol/L (ref 101–111)
Creatinine, Ser: 0.86 mg/dL (ref 0.61–1.24)
GFR calc non Af Amer: >60 mL/min (ref >60)
GLUCOSE: 85 mg/dL (ref 65–99)
Potassium: 4.1 mmol/L (ref 3.5–5.1)
Sodium: 139 mmol/L (ref 135–145)
TOTAL PROTEIN: 7.7 g/dL (ref 6.5–8.1)

## 2015-09-27 LAB — CBC
HCT: 41.1 % (ref 39.0–52.0)
Hemoglobin: 13.9 g/dL (ref 13.0–17.0)
MCH: 28.5 pg (ref 26.0–34.0)
MCHC: 33.8 g/dL (ref 30.0–36.0)
MCV: 84.4 fL (ref 78.0–100.0)
Platelets: 349 10*3/uL (ref 150–400)
RBC: 4.87 MIL/uL (ref 4.22–5.81)
RDW: 15.2 % (ref 11.5–15.5)
WBC: 10.6 10*3/uL — AB (ref 4.0–10.5)

## 2015-09-27 LAB — PROTIME-INR
INR: 1.02 (ref 0.00–1.49)
Prothrombin Time: 13.6 seconds (ref 11.6–15.2)

## 2015-09-27 LAB — APTT: APTT: 24 s (ref 24–37)

## 2015-09-27 NOTE — Progress Notes (Signed)
NPO AFTER MN.  ARRIVE AT [id number].  CURRENT LAB RESULTS , EKG, AND CXR IN CHART AND EPIC.  WILL TAKE AZOR AND ATENOLOL AM DOS W/ SIPS OF WATER AND DO FLEET ENEMA.

## 2015-09-27 NOTE — Progress Notes (Signed)
   09/27/15 1304  OBSTRUCTIVE SLEEP APNEA  Have you ever been diagnosed with sleep apnea through a sleep study? No  Do you snore loudly (loud enough to be heard through closed doors)?  1  Do you often feel tired, fatigued, or sleepy during the daytime (such as falling asleep during driving or talking to someone)? 0  Has anyone observed you stop breathing during your sleep? 0  Do you have, or are you being treated for high blood pressure? 1  BMI more than 35 kg/m2? 0  Age > 50 (1-yes) 1  Neck circumference greater than:Male 16 inches or larger, Male 17inches or larger? 1  Male Gender (Yes=1) 1  Obstructive Sleep Apnea Score 5  Score 5 or greater  Results sent to PCP

## 2015-09-29 ENCOUNTER — Encounter (HOSPITAL_BASED_OUTPATIENT_CLINIC_OR_DEPARTMENT_OTHER): Payer: Self-pay | Admitting: *Deleted

## 2015-10-02 DIAGNOSIS — I4891 Unspecified atrial fibrillation: Secondary | ICD-10-CM | POA: Diagnosis not present

## 2015-10-02 DIAGNOSIS — F17210 Nicotine dependence, cigarettes, uncomplicated: Secondary | ICD-10-CM | POA: Diagnosis not present

## 2015-10-02 DIAGNOSIS — I10 Essential (primary) hypertension: Secondary | ICD-10-CM | POA: Diagnosis not present

## 2015-10-02 DIAGNOSIS — I252 Old myocardial infarction: Secondary | ICD-10-CM | POA: Diagnosis not present

## 2015-10-02 DIAGNOSIS — E119 Type 2 diabetes mellitus without complications: Secondary | ICD-10-CM | POA: Diagnosis not present

## 2015-10-02 DIAGNOSIS — C61 Malignant neoplasm of prostate: Secondary | ICD-10-CM | POA: Diagnosis not present

## 2015-10-03 ENCOUNTER — Telehealth: Payer: Self-pay | Admitting: *Deleted

## 2015-10-03 NOTE — H&P (Signed)
History of Present Illness This is a new patient for me. He previously saw Dr. Janice Norrie.     Rad Onc Dr. Valere Dross     1 - Low Risk PCa - rising PSA to 9.83, Jul 2016. Prostate 61 grams.   Gleason 3+3 = 6, bilateral, in 5 to 10% of 3 cores at the left base and right apex. MSK - 51% probability of organ confined disease, 47% risk of extraprostatic extension, 2% risk of seminal vesicle invasion and 2% risk of lymph node involvement. Dr. Janice Norrie and Dr. Valere Dross counseled patient.     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     He has some nocturia 3, but voids with a good stream and typically has no urgency. He has some frequency after drinking caffeine. Specifically coffee. Otherwise no bothersome lower urinary tract symptoms.       Past Medical History Problems  1. History of atrial fibrillation (Z86.79) 2. History of cardiac disorder (Z86.79) 3. History of diabetes mellitus (Z86.39) 4. History of glaucoma (Z86.69) 5. History of hypertension (Z86.79) 6. History of myocardial infarction (I25.2)  Surgical History Problems  1. History of Cardiac Cath Procedure Summary 2. History of Umbilical Hernia Repair  Current Meds 1. Aspirin 81 MG TABS;  Therapy: (Recorded:14Apr2016) to Recorded 2. Atenolol 50 MG Oral Tablet;  Therapy: (Recorded:14Apr2016) to Recorded 3. Azor TABS;  Therapy: (Recorded:14Apr2016) to Recorded 4. Vitamin D-3 CAPS;  Therapy: (Recorded:02Nov2016) to Recorded  Allergies Medication  1. No Known Drug Allergies  Family History Problems  1. Family history of colon cancer (Z80.0) : Mother  Social History Problems    Denied: History of Alcohol use   Current smoker (F17.200)   Father deceased   35   Married   Mother deceased   34 colon ca   Number of children   2 daughters   Occupation   Physician  Vitals Vital Signs [Data Includes: Last 1 Day]  Recorded: [id number] 04:26PM  Blood Pressure: 135 / 68 Temperature: 98.4 F Heart  Rate: 57  Results/Data Urine [Data Includes: Last 1 Day]   [id number]  COLOR YELLOW   APPEARANCE CLEAR   SPECIFIC GRAVITY 1.025   pH 5.5   GLUCOSE NEGATIVE   BILIRUBIN NEGATIVE   KETONE TRACE   BLOOD NEGATIVE   PROTEIN TRACE   NITRITE NEGATIVE   LEUKOCYTE ESTERASE NEGATIVE    Old records or history reviewed:Marland Kitchen    Assessment Assessed  1. Adenocarcinoma of prostate (C61)  Plan Adenocarcinoma of prostate  1. Follow-up Keep Future Appt Office  Follow-up  Status: Hold For - Appointment  Requested  for: [phone] 2. PSA; Status:In Progress - Specimen/Data Collected;   Done: [id number] 3. VENIPUNCTURE; Status:Complete;   [id number] Health Maintenance  4. UA With REFLEX; [Do Not Release]; Status:Complete;   [id number] 03:27PM  Discussion/Summary   Low risk PCa - discussed again nature, R/B of AS, brachy, IMRT, radical prostatectomy. We discussed how one treatment might effect salvage treatments, bowel function, bladder function, sexual function. My only concern with Durward Fortes is possible risk of ECE because of the high PSA and PSA density, however he was T1c. we discussed how we might deal with brachytherapy failure, discussed risk of stricture, lower urinary tract symptoms/irritative symptoms, hematuria or secondary cancers with radiation therapy. Discussed need for Foley catheter postoperatively and possible risk of urinary retention. All questions answered. He hasn't had a recent PSA, therefore PSA was sent. If remains stable we'll keep plan for brachytherapy but if his PSA  has risen well over 10 we might consider prostate MRI.       Signatures Electronically signed by : Festus Aloe, M.D.; Sep 14 2015  4:49PM EST  Add: PSA 8.82 Nov 2016 - stable. UA clear.

## 2015-10-03 NOTE — Telephone Encounter (Signed)
Called patient to remind of procedure for 10-04-15, spoke with patient and he is aware of this procedure

## 2015-10-04 ENCOUNTER — Ambulatory Visit (HOSPITAL_BASED_OUTPATIENT_CLINIC_OR_DEPARTMENT_OTHER): Payer: Medicare Other | Admitting: Anesthesiology

## 2015-10-04 ENCOUNTER — Encounter: Payer: Self-pay | Admitting: Radiation Oncology

## 2015-10-04 ENCOUNTER — Ambulatory Visit (HOSPITAL_BASED_OUTPATIENT_CLINIC_OR_DEPARTMENT_OTHER)
Admission: RE | Admit: 2015-10-04 | Discharge: 2015-10-04 | Disposition: A | Discharge status: Home / Self Care | Payer: Medicare Other | Source: Ambulatory Visit | Attending: Urology | Admitting: Urology

## 2015-10-04 ENCOUNTER — Encounter (HOSPITAL_BASED_OUTPATIENT_CLINIC_OR_DEPARTMENT_OTHER)
Admission: RE | Disposition: A | Discharge status: Home / Self Care | Payer: Self-pay | Source: Ambulatory Visit | Attending: Urology

## 2015-10-04 ENCOUNTER — Encounter (HOSPITAL_BASED_OUTPATIENT_CLINIC_OR_DEPARTMENT_OTHER): Payer: Self-pay | Admitting: Anesthesiology

## 2015-10-04 ENCOUNTER — Ambulatory Visit (HOSPITAL_COMMUNITY): Payer: Medicare Other

## 2015-10-04 DIAGNOSIS — F17200 Nicotine dependence, unspecified, uncomplicated: Secondary | ICD-10-CM | POA: Insufficient documentation

## 2015-10-04 DIAGNOSIS — I4891 Unspecified atrial fibrillation: Secondary | ICD-10-CM | POA: Diagnosis not present

## 2015-10-04 DIAGNOSIS — E119 Type 2 diabetes mellitus without complications: Secondary | ICD-10-CM | POA: Insufficient documentation

## 2015-10-04 DIAGNOSIS — Z7984 Long term (current) use of oral hypoglycemic drugs: Secondary | ICD-10-CM | POA: Diagnosis not present

## 2015-10-04 DIAGNOSIS — I252 Old myocardial infarction: Secondary | ICD-10-CM | POA: Diagnosis not present

## 2015-10-04 DIAGNOSIS — Z79899 Other long term (current) drug therapy: Secondary | ICD-10-CM | POA: Insufficient documentation

## 2015-10-04 DIAGNOSIS — C679 Malignant neoplasm of bladder, unspecified: Secondary | ICD-10-CM | POA: Insufficient documentation

## 2015-10-04 DIAGNOSIS — M1990 Unspecified osteoarthritis, unspecified site: Secondary | ICD-10-CM | POA: Insufficient documentation

## 2015-10-04 DIAGNOSIS — I2510 Atherosclerotic heart disease of native coronary artery without angina pectoris: Secondary | ICD-10-CM | POA: Insufficient documentation

## 2015-10-04 DIAGNOSIS — C672 Malignant neoplasm of lateral wall of bladder: Secondary | ICD-10-CM | POA: Diagnosis not present

## 2015-10-04 DIAGNOSIS — I10 Essential (primary) hypertension: Secondary | ICD-10-CM | POA: Insufficient documentation

## 2015-10-04 DIAGNOSIS — C61 Malignant neoplasm of prostate: Secondary | ICD-10-CM | POA: Insufficient documentation

## 2015-10-04 DIAGNOSIS — Z7982 Long term (current) use of aspirin: Secondary | ICD-10-CM | POA: Insufficient documentation

## 2015-10-04 DIAGNOSIS — H409 Unspecified glaucoma: Secondary | ICD-10-CM | POA: Insufficient documentation

## 2015-10-04 HISTORY — DX: Unspecified glaucoma: H40.9

## 2015-10-04 HISTORY — DX: Paroxysmal atrial fibrillation: I48.0

## 2015-10-04 HISTORY — PX: RADIOACTIVE SEED IMPLANT: SHX5150

## 2015-10-04 HISTORY — DX: Presence of spectacles and contact lenses: Z97.3

## 2015-10-04 HISTORY — DX: Type 2 diabetes mellitus without complications: E11.9

## 2015-10-04 HISTORY — DX: Presence of coronary angioplasty implant and graft: Z95.5

## 2015-10-04 HISTORY — PX: CYSTOSCOPY WITH BIOPSY: SHX5122

## 2015-10-04 HISTORY — DX: Nocturia: R35.1

## 2015-10-04 HISTORY — DX: Unspecified osteoarthritis, unspecified site: M19.90

## 2015-10-04 HISTORY — DX: Old myocardial infarction: I25.2

## 2015-10-04 LAB — GLUCOSE, CAPILLARY
GLUCOSE-CAPILLARY: 91 mg/dL (ref 65–99)
GLUCOSE-CAPILLARY: 101 mg/dL — AB (ref 65–99)

## 2015-10-04 SURGERY — INSERTION, RADIATION SOURCE, PROSTATE
Anesthesia: General

## 2015-10-04 MED ORDER — PROPOFOL 10 MG/ML IV BOLUS
INTRAVENOUS | Status: AC
  Filled 2015-10-04: qty 20

## 2015-10-04 MED ORDER — ONDANSETRON HCL 4 MG/2ML IJ SOLN
INTRAMUSCULAR | Status: DC | PRN
  Administered 2015-10-04: 4 mg via INTRAVENOUS

## 2015-10-04 MED ORDER — TAMSULOSIN HCL 0.4 MG PO CAPS
0.4000 mg | ORAL_CAPSULE | Freq: Every day | ORAL | Status: DC
Start: 2015-10-04 — End: 2017-07-23

## 2015-10-04 MED ORDER — GLYCOPYRROLATE 0.2 MG/ML IJ SOLN
INTRAMUSCULAR | Status: DC | PRN
  Administered 2015-10-04: 0.6 mg via INTRAVENOUS

## 2015-10-04 MED ORDER — PROMETHAZINE HCL 25 MG/ML IJ SOLN
6.2500 mg | INTRAMUSCULAR | Status: DC | PRN
  Filled 2015-10-04: qty 1

## 2015-10-04 MED ORDER — LACTATED RINGERS IV SOLN
INTRAVENOUS | Status: DC
  Administered 2015-10-04 (×2): via INTRAVENOUS
  Filled 2015-10-04: qty 1000

## 2015-10-04 MED ORDER — EPHEDRINE SULFATE 50 MG/ML IJ SOLN
INTRAMUSCULAR | Status: DC | PRN
  Administered 2015-10-04 (×3): 10 mg via INTRAVENOUS

## 2015-10-04 MED ORDER — OXYCODONE-ACETAMINOPHEN 5-325 MG PO TABS: 1.0000 | ORAL_TABLET | Freq: Four times a day (QID) | ORAL | Status: DC | PRN

## 2015-10-04 MED ORDER — FENTANYL CITRATE (PF) 250 MCG/5ML IJ SOLN
INTRAMUSCULAR | Status: AC
  Filled 2015-10-04: qty 5

## 2015-10-04 MED ORDER — LACTATED RINGERS IV SOLN
INTRAVENOUS | Status: DC
  Filled 2015-10-04: qty 1000

## 2015-10-04 MED ORDER — PROPOFOL 10 MG/ML IV BOLUS
INTRAVENOUS | Status: DC | PRN
  Administered 2015-10-04: 20 mg via INTRAVENOUS
  Administered 2015-10-04: 250 mg via INTRAVENOUS

## 2015-10-04 MED ORDER — MEPERIDINE HCL 25 MG/ML IJ SOLN
6.2500 mg | INTRAMUSCULAR | Status: DC | PRN
  Filled 2015-10-04: qty 1

## 2015-10-04 MED ORDER — ROCURONIUM BROMIDE 100 MG/10ML IV SOLN
INTRAVENOUS | Status: DC | PRN
  Administered 2015-10-04: 10 mg via INTRAVENOUS
  Administered 2015-10-04: 25 mg via INTRAVENOUS
  Administered 2015-10-04: 10 mg via INTRAVENOUS
  Administered 2015-10-04: 5 mg via INTRAVENOUS

## 2015-10-04 MED ORDER — NEOSTIGMINE METHYLSULFATE 10 MG/10ML IV SOLN
INTRAVENOUS | Status: AC
  Filled 2015-10-04: qty 1

## 2015-10-04 MED ORDER — MIDAZOLAM HCL 5 MG/5ML IJ SOLN
INTRAMUSCULAR | Status: DC | PRN
  Administered 2015-10-04: 2 mg via INTRAVENOUS

## 2015-10-04 MED ORDER — MIDAZOLAM HCL 2 MG/2ML IJ SOLN
INTRAMUSCULAR | Status: AC
  Filled 2015-10-04: qty 2

## 2015-10-04 MED ORDER — CIPROFLOXACIN IN D5W 400 MG/200ML IV SOLN
400.0000 mg | INTRAVENOUS | Status: AC
  Administered 2015-10-04: 400 mg via INTRAVENOUS
  Filled 2015-10-04: qty 200

## 2015-10-04 MED ORDER — FLEET ENEMA 7-19 GM/118ML RE ENEM
1.0000 | ENEMA | Freq: Once | RECTAL | Status: DC
  Filled 2015-10-04: qty 1

## 2015-10-04 MED ORDER — FENTANYL CITRATE (PF) 100 MCG/2ML IJ SOLN
INTRAMUSCULAR | Status: DC | PRN
  Administered 2015-10-04 (×6): 50 ug via INTRAVENOUS

## 2015-10-04 MED ORDER — NEOSTIGMINE METHYLSULFATE 10 MG/10ML IV SOLN
INTRAVENOUS | Status: DC | PRN
  Administered 2015-10-04: 4 mg via INTRAVENOUS

## 2015-10-04 MED ORDER — GLYCOPYRROLATE 0.2 MG/ML IJ SOLN
INTRAMUSCULAR | Status: AC
  Filled 2015-10-04: qty 3

## 2015-10-04 MED ORDER — FENTANYL CITRATE (PF) 100 MCG/2ML IJ SOLN
INTRAMUSCULAR | Status: AC
  Filled 2015-10-04: qty 2

## 2015-10-04 MED ORDER — ONDANSETRON HCL 4 MG/2ML IJ SOLN
INTRAMUSCULAR | Status: AC
  Filled 2015-10-04: qty 2

## 2015-10-04 MED ORDER — ROCURONIUM BROMIDE 100 MG/10ML IV SOLN
INTRAVENOUS | Status: AC
  Filled 2015-10-04: qty 1

## 2015-10-04 MED ORDER — BELLADONNA ALKALOIDS-OPIUM 16.2-60 MG RE SUPP
RECTAL | Status: AC
  Filled 2015-10-04: qty 1

## 2015-10-04 MED ORDER — EPHEDRINE SULFATE 50 MG/ML IJ SOLN
INTRAMUSCULAR | Status: AC
  Filled 2015-10-04: qty 1

## 2015-10-04 MED ORDER — DEXAMETHASONE SODIUM PHOSPHATE 4 MG/ML IJ SOLN
INTRAMUSCULAR | Status: DC | PRN
  Administered 2015-10-04: 10 mg via INTRAVENOUS

## 2015-10-04 MED ORDER — LIDOCAINE HCL 4 % MT SOLN
OROMUCOSAL | Status: DC | PRN
  Administered 2015-10-04: 3 mL via TOPICAL

## 2015-10-04 MED ORDER — KETOROLAC TROMETHAMINE 30 MG/ML IJ SOLN
INTRAMUSCULAR | Status: AC
  Filled 2015-10-04: qty 1

## 2015-10-04 MED ORDER — FENTANYL CITRATE (PF) 100 MCG/2ML IJ SOLN
25.0000 ug | INTRAMUSCULAR | Status: DC | PRN
  Filled 2015-10-04: qty 1

## 2015-10-04 MED ORDER — SUCCINYLCHOLINE CHLORIDE 20 MG/ML IJ SOLN
INTRAMUSCULAR | Status: DC | PRN
  Administered 2015-10-04: 100 mg via INTRAVENOUS

## 2015-10-04 MED ORDER — LIDOCAINE HCL (CARDIAC) 20 MG/ML IV SOLN
INTRAVENOUS | Status: DC | PRN
  Administered 2015-10-04: 80 mg via INTRAVENOUS

## 2015-10-04 MED ORDER — CEPHALEXIN 500 MG PO CAPS: 500.0000 mg | ORAL_CAPSULE | Freq: Three times a day (TID) | ORAL | Status: DC

## 2015-10-04 MED ORDER — KETOROLAC TROMETHAMINE 30 MG/ML IJ SOLN
INTRAMUSCULAR | Status: DC | PRN
  Administered 2015-10-04: 15 mg via INTRAVENOUS

## 2015-10-04 MED ORDER — DEXAMETHASONE SODIUM PHOSPHATE 10 MG/ML IJ SOLN
INTRAMUSCULAR | Status: AC
  Filled 2015-10-04: qty 1

## 2015-10-04 MED ORDER — IOHEXOL 350 MG/ML SOLN
INTRAVENOUS | Status: DC | PRN
  Administered 2015-10-04: 3 mL

## 2015-10-04 MED ORDER — LIDOCAINE HCL (CARDIAC) 20 MG/ML IV SOLN
INTRAVENOUS | Status: AC
  Filled 2015-10-04: qty 5

## 2015-10-04 MED ORDER — CIPROFLOXACIN IN D5W 400 MG/200ML IV SOLN
INTRAVENOUS | Status: AC
Start: 2015-10-04 — End: 2015-10-04
  Filled 2015-10-04: qty 200

## 2015-10-04 SURGICAL SUPPLY — 25 items
BAG URINE DRAINAGE (UROLOGICAL SUPPLIES) ×3 IMPLANT
BLADE CLIPPER SURG (BLADE) ×3 IMPLANT
CATH FOLEY 2WAY SLVR  5CC 16FR (CATHETERS) ×2
CATH FOLEY 2WAY SLVR 5CC 16FR (CATHETERS) ×4 IMPLANT
CATH ROBINSON RED A/P 20FR (CATHETERS) ×3 IMPLANT
CLOTH BEACON ORANGE TIMEOUT ST (SAFETY) ×3 IMPLANT
COVER BACK TABLE 60X90IN (DRAPES) ×3 IMPLANT
COVER MAYO STAND STRL (DRAPES) ×3 IMPLANT
DRSG TEGADERM 4X4.75 (GAUZE/BANDAGES/DRESSINGS) ×3 IMPLANT
DRSG TEGADERM 8X12 (GAUZE/BANDAGES/DRESSINGS) ×3 IMPLANT
DRSG TELFA 3X8 NADH (GAUZE/BANDAGES/DRESSINGS) ×3 IMPLANT
GLOVE BIO SURGEON STRL SZ7.5 (GLOVE) ×6 IMPLANT
GOWN STRL REUS W/ TWL LRG LVL3 (GOWN DISPOSABLE) ×2 IMPLANT
GOWN STRL REUS W/ TWL XL LVL3 (GOWN DISPOSABLE) ×2 IMPLANT
GOWN STRL REUS W/TWL LRG LVL3 (GOWN DISPOSABLE) ×3
GOWN STRL REUS W/TWL XL LVL3 (GOWN DISPOSABLE) ×3
HOLDER FOLEY CATH W/STRAP (MISCELLANEOUS) ×3 IMPLANT
KIT ROOM TURNOVER WOR (KITS) ×3 IMPLANT
PACK CYSTO (CUSTOM PROCEDURE TRAY) ×3 IMPLANT
PAD DRESSING TELFA 3X8 NADH (GAUZE/BANDAGES/DRESSINGS) IMPLANT
SELECTSEED I-125 ×74 IMPLANT
SPONGE GAUZE 4X4 12PLY STER LF (GAUZE/BANDAGES/DRESSINGS) ×1 IMPLANT
SYRINGE 10CC LL (SYRINGE) ×3 IMPLANT
UNDERPAD 30X30 INCONTINENT (UNDERPADS AND DIAPERS) ×6 IMPLANT
WATER STERILE IRR 500ML POUR (IV SOLUTION) ×3 IMPLANT

## 2015-10-04 NOTE — Interval H&P Note (Signed)
History and Physical Interval Note:  10/04/2015 10:09 AM  Corey Beard  has presented today for surgery, with the diagnosis of PROSTATE CANCER  The various methods of treatment have been discussed with the patient and family. After consideration of risks, benefits and other options for treatment, the patient has consented to  Procedure(s): RADIOACTIVE SEED IMPLANT/BRACHYTHERAPY IMPLANT (N/A) as a surgical intervention .  The patient's history has been reviewed, patient examined, no change in status, stable for surgery. Discussed foley post-op and alpha blocker. Pt well without dysuria or gross hematuria. I have reviewed the patient's chart and labs.  Questions were answered to the patient's satisfaction.     Corey Beard

## 2015-10-04 NOTE — Transfer of Care (Signed)
Last Vitals:  Filed Vitals:   10/04/15 0916  BP: 140/53  Pulse: 50  Temp: 36.9 C  Resp: 16    Immediate Anesthesia Transfer of Care Note  Patient: Corey Beard  Procedure(s) Performed: Procedure(s) (LRB): RADIOACTIVE SEED IMPLANT/BRACHYTHERAPY IMPLANT (N/A) CYSTOSCOPY WITH BIOPSY  Patient Location: PACU  Anesthesia Type: General  Level of Consciousness: awake, alert  and oriented  Airway & Oxygen Therapy: Patient Spontanous Breathing and Patient connected to face mask oxygen  Post-op Assessment: Report given to PACU RN and Post -op Vital signs reviewed and stable  Post vital signs: Reviewed and stable  Complications: No apparent anesthesia complications

## 2015-10-04 NOTE — Op Note (Signed)
Preoperative diagnosis: prostate cancer  Postoperative diagnosis: prostate cancer, bladder neoplasm  Procedure: Prostate seed implantation (1-125) Cystoscopy, bladder biopsy, fulguration Foley catheter placement  Surgeon: Junious Silk Asst: Valere Dross  Anesthesia: Gen  Indication for procedure: 69 year old with localized prostate cancer who presents for brachytherapy. On cystoscopy in the OR a small papillary tumor was noted about 3-4 cm lateral to the left ureteral orifice. It appeared superficial.  Description of procedure: After consent was obtained patient brought to the operating room. After adequate anesthesia he is placed in lithotomy position and Dr. Valere Dross placed transrectal ultrasound and perineal template. Treatment planning, needles and seeds were planned by Dr. Valere Dross and his team.  I then placed 28 needles. A total of 74 seeds were placed. A/P and lateral flouro with and without foley confirmed excellent seed placement.   The patient was prepped again. The flexible cystoscope was passed per urethra. The urethra and prostatic urethra appeared normal without seeds. There were no stones or foreign bodies in the bladder. The trigone and ureteral orifices appeared normal with clear efflux. There was a superficial appearing papillary tumor a few centimeters lateral to the left ureteral orifice.  I scrubbed out and went and discussed the finding with the patient's wife and got her verbal consent for bladder biopsy and fulguration.  The patient was prepped and draped again and the rigid cystoscope was passed per urethra into the bladder. I inspected the mucosa a can or noted this 1 small papillary tumor. The flexible biopsy forceps were used to biopsy at space which removed it in its entirety. The biopsy site was fulgurated. The left ureteral orifice was not involved.  The scope was removed and a 63 [place] Foley catheter was placed and left gravity drainage.  Complications: None  Blood  loss: Minimal  Specimens to pathology: Left bladder tumor  Drains: 16 French Foley  Disposition: Patient stable to PACU

## 2015-10-04 NOTE — Anesthesia Procedure Notes (Signed)
Procedure Name: Intubation Date/Time: 10/04/2015 10:41 AM Performed by: Mechele Claude Pre-anesthesia Checklist: Patient identified, Emergency Drugs available, Suction available and Patient being monitored Patient Re-evaluated:Patient Re-evaluated prior to inductionOxygen Delivery Method: Circle System Utilized Preoxygenation: Pre-oxygenation with 100% oxygen Intubation Type: IV induction Ventilation: Mask ventilation without difficulty Laryngoscope Size: Mac and 4 Grade View: Grade I Tube type: Oral Tube size: 8.0 mm Number of attempts: 1 Airway Equipment and Method: Stylet and LTA kit utilized Placement Confirmation: ETT inserted through vocal cords under direct vision,  positive ETCO2 and breath sounds checked- equal and bilateral Secured at: 22 cm Tube secured with: Tape Dental Injury: Teeth and Oropharynx as per pre-operative assessment

## 2015-10-04 NOTE — Progress Notes (Addendum)
[facility] Brachytherapy Operative Procedure Note  Name: [name] MRN: [id number]  Date:   10/04/2015           DOB: 1945-12-04  Status:outpatient    [id number], MD  Dr. Altamese Dilling Nesi/Dr. Festus Aloe DIAGNOSIS: A 69 year old gentlemen with stage T1c adenocarcinoma of the prostate with a Gleason of 6 and a PSA of 9.83.  PROCEDURE: Insertion of radioactive I-125 seeds into the prostate gland.  RADIATION DOSE: 145 Gy, definitive therapy.  TECHNIQUE: JOHNTE GILDAY was brought to the operating room with Dr. Junious Silk. He was placed in the dorsolithotomy position. He was catheterized and a rectal tube was inserted. The perineum was shaved, prepped and draped. The ultrasound probe was then introduced into the rectum to see the prostate gland.  TREATMENT DEVICE: A needle grid was attached to the ultrasound probe stand and anchor needles were placed.  COMPLEX ISODOSE CALCULATION: The prostate was imaged in 3D using a sagittal sweep of the prostate probe. These images were transferred to the planning computer. There, the prostate, urethra and rectum were defined on each axial reconstructed image. Then, the software created an optimized plan and a few seed positions were adjusted. Then the accepted plan was uploaded to the seed Selectron afterloading unit.  SPECIAL TREATMENT PROCEDURE/SUPERVISION AND HANDLING: The Nucletron FIRST system was used to place the needles under sagittal guidance. A total of 28 needles were used to deposit 74 seeds in the prostate gland. The individual seed activity was 0.658 mCi for a total implant activity of 48.77 mCi.  COMPLEX SIMULATION: At the end of the procedure, an anterior radiograph of the pelvis was obtained to document seed positioning and count. Cystoscopy was performed to check the urethra and bladder.  MICRODOSIMETRY: At the end of the procedure, the patient was emitting 0.21 mrem/hr at 1 meter.  Accordingly, he was considered safe for hospital discharge.  PLAN: The patient will return to the radiation oncology clinic for post implant CT dosimetry in three weeks.

## 2015-10-04 NOTE — Discharge Instructions (Signed)
Foley Catheter Care, Adult A Foley catheter is a soft, flexible tube. This tube is placed into your bladder to drain pee (urine). If you go home with this catheter in place, follow the instructions below.  REMOVAL OF THE CATHETER - remove the catheter as instructed early tomorrow morning, Wednesday, 10/05/2015.   TAKING CARE OF THE CATHETER 1. Wash your hands with soap and water. 2. Put soap and water on a clean washcloth.  Clean the skin where the tube goes into your body.  Clean away from the tube site.  Never wipe toward the tube.  Clean the area using a circular motion.  Remove all the soap. Pat the area dry with a clean towel. For males, reposition the skin that covers the end of the penis (foreskin). 3. Attach the tube to your leg with tape or a leg strap. Do not stretch the tube tight. If you are using tape, remove any stickiness left behind by past tape you used. 4. Keep the drainage bag below your hips. Keep it off the floor. 5. Check your tube during the day. Make sure it is working and draining. Make sure the tube does not curl, twist, or bend. 6. Do not pull on the tube or try to take it out. TAKING CARE OF THE DRAINAGE BAGS You will have a large overnight drainage bag and a small leg bag. You may wear the overnight bag any time. Never wear the small bag at night. Follow the directions below. Emptying the Drainage Bag Empty your drainage bag when it is  - full or at least 2-3 times a day. 1. Wash your hands with soap and water. 2. Keep the drainage bag below your hips. 3. Hold the dirty bag over the toilet or clean container. 4. Open the pour spout at the bottom of the bag. Empty the pee into the toilet or container. Do not let the pour spout touch anything. 5. Clean the pour spout with a gauze pad or cotton ball that has rubbing alcohol on it. 6. Close the pour spout. 7. Attach the bag to your leg with tape or a leg strap. 8. Wash your hands well. Changing the Drainage  Bag Change your bag once a month or sooner if it starts to smell or look dirty.  1. Wash your hands with soap and water. 2. Pinch the rubber tube so that pee does not spill out. 3. Disconnect the catheter tube from the drainage tube at the connection valve. Do not let the tubes touch anything. 4. Clean the end of the catheter tube with an alcohol wipe. Clean the end of a the drainage tube with a different alcohol wipe. 5. Connect the catheter tube to the drainage tube of the clean drainage bag. 6. Attach the new bag to the leg with tape or a leg strap. Avoid attaching the new bag too tightly. 7. Wash your hands well. Cleaning the Drainage Bag 1. Wash your hands with soap and water. 2. Wash the bag in warm, soapy water. 3. Rinse the bag with warm water. 4. Fill the bag with a mixture of white vinegar and water (1 cup vinegar to 1 quart warm water [.2 liter vinegar to 1 liter warm water]). Close the bag and soak it for 30 minutes in the solution. 5. Rinse the bag with warm water. 6. Hang the bag to dry with the pour spout open and hanging downward. 7. Store the clean bag (once it is dry) in a clean plastic  bag. 8. Wash your hands well. PREVENT INFECTION  Wash your hands before and after touching your tube.  Take showers every day. Wash the skin where the tube enters your body. Do not take baths. Replace wet leg straps with dry ones, if this applies.  Do not use powders, sprays, or lotions on the genital area. Only use creams, lotions, or ointments as told by your doctor.  For females, wipe from front to back after going to the bathroom.  Drink enough fluids to keep your pee clear or pale yellow unless you are told not to have too much fluid (fluid restriction).  Do not let the drainage bag or tubing touch or lie on the floor.  Wear cotton underwear to keep the area dry. GET HELP IF:  Your pee is cloudy or smells unusually bad.  Your tube becomes clogged.  You are not draining pee  into the bag or your bladder feels full.  Your tube starts to leak. GET HELP RIGHT AWAY IF:  You have pain, puffiness (swelling), redness, or yellowish-white fluid (pus) where the tube enters the body.  You have pain in the belly (abdomen), legs, lower back, or bladder.  You have a fever.  You see blood fill the tube, or your pee is pink or red.  You feel sick to your stomach (nauseous), throw up (vomit), or have chills.  Your tube gets pulled out. MAKE SURE YOU:   Understand these instructions.  Will watch your condition.  Will get help right away if you are not doing well or get worse.   This information is not intended to replace advice given to you by your health care provider. Make sure you discuss any questions you have with your health care provider.   Document Released: 02/23/2013 Document Revised: 11/19/2014 Document Reviewed: 02/23/2013 Elsevier Interactive Patient Education 2016 [place].   Cystoscopy, Care After Refer to this sheet in the next few weeks. These instructions provide you with information on caring for yourself after your procedure. Your caregiver may also give you more specific instructions. Your treatment has been planned according to current medical practices, but problems sometimes occur. Call your caregiver if you have any problems or questions after your procedure. HOME CARE INSTRUCTIONS  Things you can do to ease any discomfort after your procedure include: 7. Drinking enough water and fluids to keep your urine clear or pale yellow. 8. Taking a warm bath to relieve any burning feelings. SEEK IMMEDIATE MEDICAL CARE IF:  9. You have an increase in blood in your urine. 10. You notice blood clots in your urine. 11. You have difficulty passing urine. 12. You have the chills. 13. You have abdominal pain. 14. You have a fever or persistent symptoms for more than 2-3 days. 15. You have a fever and your symptoms suddenly get worse. MAKE SURE  YOU:  8. Understand these instructions. 9. Will watch your condition. 10. Will get help right away if you are not doing well or get worse.   This information is not intended to replace advice given to you by your health care provider. Make sure you discuss any questions you have with your health care provider.   Document Released: 05/18/2005 Document Revised: 11/19/2014 Document Reviewed: 04/21/2012 Elsevier Interactive Patient Education 2016 [place] for Prostate Cancer, Care After Refer to this sheet in the next few weeks. These instructions provide you with information on caring for yourself after your procedure. Your health care provider may also  give you more specific instructions. Your treatment has been planned according to current medical practices, but problems sometimes occur. Call your health care provider if you have any problems or questions after your procedure. WHAT TO EXPECT AFTER THE PROCEDURE The area behind the scrotum will probably be tender and bruised. For a short period of time you may have: 9. Difficulty passing urine. You may need a catheter for a few days to a month. 10. Blood in the urine or semen. 11. A feeling of constipation because of prostate swelling. 12. Frequent feeling of an urgent need to urinate. For a long period of time you may have: 16. Inflammation of the rectum. This happens in about 2% of people who have the procedure. 17. Erection problems. These vary with age and occur in about 15-40% of men. 18. Difficulty urinating. This is caused by scarring in the urethra. 19. Diarrhea. HOME CARE INSTRUCTIONS  11. Take medicines only as directed by your health care provider. 12. You will probably have a catheter in your bladder for several days. You will have blood in the urine bag and should drink a lot of fluids to keep it a light red color. 79. Keep all follow-up visits as directed by your health care provider. If you have a  catheter, it will be removed during one of these visits. 14. Try not to sit directly on the area behind the scrotum. A soft cushion can decrease the discomfort. Ice packs may also be helpful for the discomfort. Do not put ice directly on the skin. 15. Shower and wash the area behind the scrotum gently. Do not sit in a tub. 72. If you have had the brachytherapy that uses the seeds, limit your close contact with children and pregnant women for 2 months because of the radiation still in the prostate. After that period of time, the levels drop off quickly. SEEK IMMEDIATE MEDICAL CARE IF:  9. You have a fever. 10. You have chills. 11. You have shortness of breath. 12. You have chest pain. 13. You have thick blood, like tomato juice, in the urine bag. 14. Your catheter is blocked so urine cannot get into the bag. Your bladder area or lower abdomen may be swollen. 15. There is excessive bleeding from your rectum. It is normal to have a little blood mixed with your stool. 16. There is severe discomfort in the treated area that does not go away with pain medicine. 17. You have abdominal discomfort. 18. You have severe nausea or vomiting. 19. You develop any new or unusual symptoms.   This information is not intended to replace advice given to you by your health care provider. Make sure you discuss any questions you have with your health care provider.     Post Anesthesia Home Care Instructions  Activity: Get plenty of rest for the remainder of the day. A responsible adult should stay with you for 24 hours following the procedure.  For the next 24 hours, DO NOT: -Drive a car -Paediatric nurse -Drink alcoholic beverages -Take any medication unless instructed by your physician -Make any legal decisions or sign important papers.  Meals: Start with liquid foods such as gelatin or soup. Progress to regular foods as tolerated. Avoid greasy, spicy, heavy foods. If nausea and/or vomiting occur, drink  only clear liquids until the nausea and/or vomiting subsides. Call your physician if vomiting continues.  Special Instructions/Symptoms: Your throat may feel dry or sore from the anesthesia or the breathing tube placed in your  throat during surgery. If this causes discomfort, gargle with warm salt water. The discomfort should disappear within 24 hours.  If you had a scopolamine patch placed behind your ear for the management of post- operative nausea and/or vomiting:  1. The medication in the patch is effective for 72 hours, after which it should be removed.  Wrap patch in a tissue and discard in the trash. Wash hands thoroughly with soap and water. 2. You may remove the patch earlier than 72 hours if you experience unpleasant side effects which may include dry mouth, dizziness or visual disturbances. 3. Avoid touching the patch. Wash your hands with soap and water after contact with the patch.

## 2015-10-04 NOTE — Progress Notes (Signed)
CC: Dr. Festus Aloe  End of treatment summary:  Diagnosis: Stage TIc favorable risk adenocarcinoma prostate  Attending urologist: Dr. Festus Aloe  Intent: Curative  Site/dose:  Prostate 14,500 cGy, utilizing I-125 seeds.  74 seeds were implanted with 28 active needles.  Narrative: The patient appears to have undergone a successful Oncentra prostate seed implant with Dr. Junious Silk.  Plan: Follow visit with me in 3 weeks.  We will also obtain a CT scan at that time for his post implant dosimetry.

## 2015-10-04 NOTE — Anesthesia Preprocedure Evaluation (Addendum)
Anesthesia Evaluation  Patient identified by MRN, date of birth, ID band Patient awake    Reviewed: Allergy & Precautions, NPO status , Patient's Chart, lab work & pertinent test results  Airway Mallampati: II  TM Distance: >3 FB Neck ROM: Full    Dental  (+) Teeth Intact   Pulmonary Current Smoker,    breath sounds clear to auscultation       Cardiovascular hypertension, Pt. on medications and Pt. on home beta blockers + CAD  + dysrhythmias Atrial Fibrillation  Rhythm:Regular Rate:Normal  No active cardiac symptoms.    Neuro/Psych negative neurological ROS  negative psych ROS   GI/Hepatic negative GI ROS, Neg liver ROS,   Endo/Other  diabetes, Type 2, Oral Hypoglycemic Agents  Renal/GU negative Renal ROS  negative genitourinary   Musculoskeletal  (+) Arthritis ,   Abdominal   Peds negative pediatric ROS (+)  Hematology negative hematology ROS (+)   Anesthesia Other Findings   Reproductive/Obstetrics                           Anesthesia Physical Anesthesia Plan  ASA: III  Anesthesia Plan: General   Post-op Pain Management:    Induction: Intravenous  Airway Management Planned: Oral ETT  Additional Equipment:   Intra-op Plan:   Post-operative Plan: Extubation in OR  Informed Consent: I have reviewed the patients History and Physical, chart, labs and discussed the procedure including the risks, benefits and alternatives for the proposed anesthesia with the patient or authorized representative who has indicated his/her understanding and acceptance.   Dental advisory given  Plan Discussed with: CRNA  Anesthesia Plan Comments: (Possible LMA)        Anesthesia Quick Evaluation

## 2015-10-05 ENCOUNTER — Encounter (HOSPITAL_BASED_OUTPATIENT_CLINIC_OR_DEPARTMENT_OTHER): Payer: Self-pay | Admitting: Urology

## 2015-10-05 NOTE — Anesthesia Postprocedure Evaluation (Signed)
Anesthesia Post Note  Patient: Corey Beard  Procedure(s) Performed: Procedure(s) (LRB): RADIOACTIVE SEED IMPLANT/BRACHYTHERAPY IMPLANT (N/A) CYSTOSCOPY WITH BIOPSY  Patient location during evaluation: PACU Anesthesia Type: General Level of consciousness: awake and alert Pain management: pain level controlled Vital Signs Assessment: post-procedure vital signs reviewed and stable Respiratory status: spontaneous breathing, nonlabored ventilation, respiratory function stable and patient connected to nasal cannula oxygen Cardiovascular status: blood pressure returned to baseline and stable Postop Assessment: No signs of nausea or vomiting Anesthetic complications: no    Last Vitals:  Filed Vitals:   10/04/15 1445 10/04/15 1537  BP: 145/82 145/52  Pulse: 71 69  Temp:  36.6 C  Resp: 19 16    Last Pain:  Filed Vitals:   10/05/15 0913  PainSc: 0-No pain                 Effie Berkshire

## 2015-10-19 DIAGNOSIS — R351 Nocturia: Secondary | ICD-10-CM | POA: Diagnosis not present

## 2015-10-19 DIAGNOSIS — R350 Frequency of micturition: Secondary | ICD-10-CM | POA: Diagnosis not present

## 2015-10-19 DIAGNOSIS — N281 Cyst of kidney, acquired: Secondary | ICD-10-CM | POA: Diagnosis not present

## 2015-10-19 DIAGNOSIS — C61 Malignant neoplasm of prostate: Secondary | ICD-10-CM | POA: Diagnosis not present

## 2015-10-21 ENCOUNTER — Telehealth: Payer: Self-pay | Admitting: *Deleted

## 2015-10-21 NOTE — Telephone Encounter (Signed)
CALLED PATIENT TO REMIND OF APPTS. FOR 10-24-15, SPOKE WITH PATIENT AND HE IS AWARE OF THESE APPTS.

## 2015-10-24 ENCOUNTER — Ambulatory Visit
Admit: 2015-10-24 | Discharge: 2015-10-24 | Disposition: A | Discharge status: Home / Self Care | Payer: Medicare Other | Attending: Radiation Oncology | Admitting: Radiation Oncology

## 2015-10-24 ENCOUNTER — Ambulatory Visit
Admit: 2015-10-24 | Discharge: 2015-10-24 | Disposition: A | Discharge status: Home / Self Care | Payer: Medicare Other | Source: Ambulatory Visit | Attending: Radiation Oncology | Admitting: Radiation Oncology

## 2015-10-24 VITALS — BP 141/65 | HR 54 | Temp 98.2°F | Ht 73.0 in | Wt 208.3 lb

## 2015-10-24 DIAGNOSIS — C61 Malignant neoplasm of prostate: Secondary | ICD-10-CM | POA: Diagnosis not present

## 2015-10-24 DIAGNOSIS — Z510 Encounter for antineoplastic radiation therapy: Secondary | ICD-10-CM | POA: Insufficient documentation

## 2015-10-24 NOTE — Progress Notes (Signed)
CC: Dr. 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 Junious Silk in the management of his stage TIc favorable risk adenocarcinoma prostate. He does report dysuria and slowing of his urinary stream. These are improved after stopping Flomax and starting Rapaflo and also taking Uribel after visiting [facility] this past week.  He did notice a few drops of blood on initiation of urination this past week. No GI difficulties.   His CT scan today for his post implant dosimetry shows what appears to be a very favorable seed distribution.   Impression: Satisfactory progress.  It appears to have some degree of urethritis.   We talked about the fact that he may have urinary symptoms for a number of months since the half-life of his isotope, iodine 125, is 2 months.  We will move ahead with his post implant dosimetry and for the results to Dr. Junious Silk.    Plan: We will move ahead with his post implant dosimetry.  He'll see Dr. Junious Silk for a follow-up visit in March.  He can be seen here as needed at the request of [facility].

## 2015-10-24 NOTE — Progress Notes (Signed)
Complex simulation note: The patient was taken to the CT simulator.  His pelvis was scanned.  The CT data set was sent to the  [facility]  Planning system where contoured his prostate and rectum. He will now undergo post implant dosimetry to assess the quality of his implant.

## 2015-10-24 NOTE — Progress Notes (Signed)
Corey Beard is S/P Radiation therapy Seed implant to the prostate.  He reports frequency, urgency, dribbling prior to voiding, nocturia 2-3 times and more frequency during the day and he also notes pressure in the rectal area when voiding. He also notes intermittent spots of blood upon urination.  He denies proctitis or loose/diarrheal stools.

## 2015-10-26 ENCOUNTER — Encounter: Payer: Self-pay | Admitting: Radiation Oncology

## 2015-10-26 DIAGNOSIS — Z510 Encounter for antineoplastic radiation therapy: Secondary | ICD-10-CM | POA: Diagnosis not present

## 2015-10-26 DIAGNOSIS — C61 Malignant neoplasm of prostate: Secondary | ICD-10-CM | POA: Diagnosis not present

## 2015-10-26 NOTE — Progress Notes (Signed)
CC: Dr. 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  His intraoperative prostate volume by ultrasound was 74.8 mL while his postoperative prostate volume by CT was 70.1 mL, a reasonable correlation.  Dose volume histograms were obtained for the prostate and rectum.  His his D 90 is 120.4% (goal greater than 90%) and his V100 approximately 98% (goal greater than 80-90%).  Only 0.37 mL (goal less than 1.0 mL) of rectum received the prescribed dose of 14,500 cGy.  In summary, he has excellent dosimetry for coverage of the prostate with a low risk for late rectal toxicity.

## 2015-11-09 DIAGNOSIS — R3915 Urgency of urination: Secondary | ICD-10-CM | POA: Diagnosis not present

## 2015-11-09 DIAGNOSIS — R350 Frequency of micturition: Secondary | ICD-10-CM | POA: Diagnosis not present

## 2015-11-10 DIAGNOSIS — R338 Other retention of urine: Secondary | ICD-10-CM | POA: Diagnosis not present

## 2015-11-18 DIAGNOSIS — R300 Dysuria: Secondary | ICD-10-CM | POA: Diagnosis not present

## 2015-11-18 DIAGNOSIS — R338 Other retention of urine: Secondary | ICD-10-CM | POA: Diagnosis not present

## 2015-11-18 DIAGNOSIS — Z0000 Encounter for general adult medical examination without abnormal findings: Secondary | ICD-10-CM | POA: Diagnosis not present

## 2015-11-18 DIAGNOSIS — N9989 Other postprocedural complications and disorders of genitourinary system: Secondary | ICD-10-CM | POA: Diagnosis not present

## 2015-12-08 DIAGNOSIS — H401132 Primary open-angle glaucoma, bilateral, moderate stage: Secondary | ICD-10-CM | POA: Diagnosis not present

## 2015-12-08 DIAGNOSIS — E119 Type 2 diabetes mellitus without complications: Secondary | ICD-10-CM | POA: Diagnosis not present

## 2015-12-08 DIAGNOSIS — H35033 Hypertensive retinopathy, bilateral: Secondary | ICD-10-CM | POA: Diagnosis not present

## 2016-02-06 DIAGNOSIS — C61 Malignant neoplasm of prostate: Secondary | ICD-10-CM | POA: Diagnosis not present

## 2016-02-08 DIAGNOSIS — C672 Malignant neoplasm of lateral wall of bladder: Secondary | ICD-10-CM | POA: Diagnosis not present

## 2016-02-08 DIAGNOSIS — R300 Dysuria: Secondary | ICD-10-CM | POA: Diagnosis not present

## 2016-02-08 DIAGNOSIS — Z0000 Encounter for general adult medical examination without abnormal findings: Secondary | ICD-10-CM | POA: Diagnosis not present

## 2016-02-08 DIAGNOSIS — R338 Other retention of urine: Secondary | ICD-10-CM | POA: Diagnosis not present

## 2016-02-08 DIAGNOSIS — N401 Enlarged prostate with lower urinary tract symptoms: Secondary | ICD-10-CM | POA: Diagnosis not present

## 2016-02-09 DIAGNOSIS — I10 Essential (primary) hypertension: Secondary | ICD-10-CM | POA: Diagnosis not present

## 2016-02-09 DIAGNOSIS — N529 Male erectile dysfunction, unspecified: Secondary | ICD-10-CM | POA: Diagnosis not present

## 2016-02-10 DIAGNOSIS — Z0000 Encounter for general adult medical examination without abnormal findings: Secondary | ICD-10-CM | POA: Diagnosis not present

## 2016-02-10 DIAGNOSIS — C61 Malignant neoplasm of prostate: Secondary | ICD-10-CM | POA: Diagnosis not present

## 2016-02-10 DIAGNOSIS — C672 Malignant neoplasm of lateral wall of bladder: Secondary | ICD-10-CM | POA: Diagnosis not present

## 2016-02-16 DIAGNOSIS — I10 Essential (primary) hypertension: Secondary | ICD-10-CM | POA: Diagnosis not present

## 2016-02-16 DIAGNOSIS — N529 Male erectile dysfunction, unspecified: Secondary | ICD-10-CM | POA: Diagnosis not present

## 2016-03-13 DIAGNOSIS — H35033 Hypertensive retinopathy, bilateral: Secondary | ICD-10-CM | POA: Diagnosis not present

## 2016-03-13 DIAGNOSIS — H25813 Combined forms of age-related cataract, bilateral: Secondary | ICD-10-CM | POA: Diagnosis not present

## 2016-03-13 DIAGNOSIS — E0590 Thyrotoxicosis, unspecified without thyrotoxic crisis or storm: Secondary | ICD-10-CM | POA: Diagnosis not present

## 2016-03-13 DIAGNOSIS — H401132 Primary open-angle glaucoma, bilateral, moderate stage: Secondary | ICD-10-CM | POA: Diagnosis not present

## 2016-05-01 DIAGNOSIS — C61 Malignant neoplasm of prostate: Secondary | ICD-10-CM | POA: Diagnosis not present

## 2016-05-17 DIAGNOSIS — C670 Malignant neoplasm of trigone of bladder: Secondary | ICD-10-CM | POA: Diagnosis not present

## 2016-05-17 DIAGNOSIS — C61 Malignant neoplasm of prostate: Secondary | ICD-10-CM | POA: Diagnosis not present

## 2016-05-17 DIAGNOSIS — R32 Unspecified urinary incontinence: Secondary | ICD-10-CM | POA: Diagnosis not present

## 2016-05-17 DIAGNOSIS — N5201 Erectile dysfunction due to arterial insufficiency: Secondary | ICD-10-CM | POA: Diagnosis not present

## 2016-05-24 DIAGNOSIS — N401 Enlarged prostate with lower urinary tract symptoms: Secondary | ICD-10-CM | POA: Diagnosis not present

## 2016-05-24 DIAGNOSIS — N3944 Nocturnal enuresis: Secondary | ICD-10-CM | POA: Diagnosis not present

## 2016-05-24 DIAGNOSIS — R351 Nocturia: Secondary | ICD-10-CM | POA: Diagnosis not present

## 2016-07-25 IMAGING — CR DG CHEST 2V
2 series · 2 of 2 positions shown · non-contrast
Comparison: None.

CLINICAL DATA: Positive PPD, smoking history

EXAM:
CHEST  2 VIEW

[w chest pa]
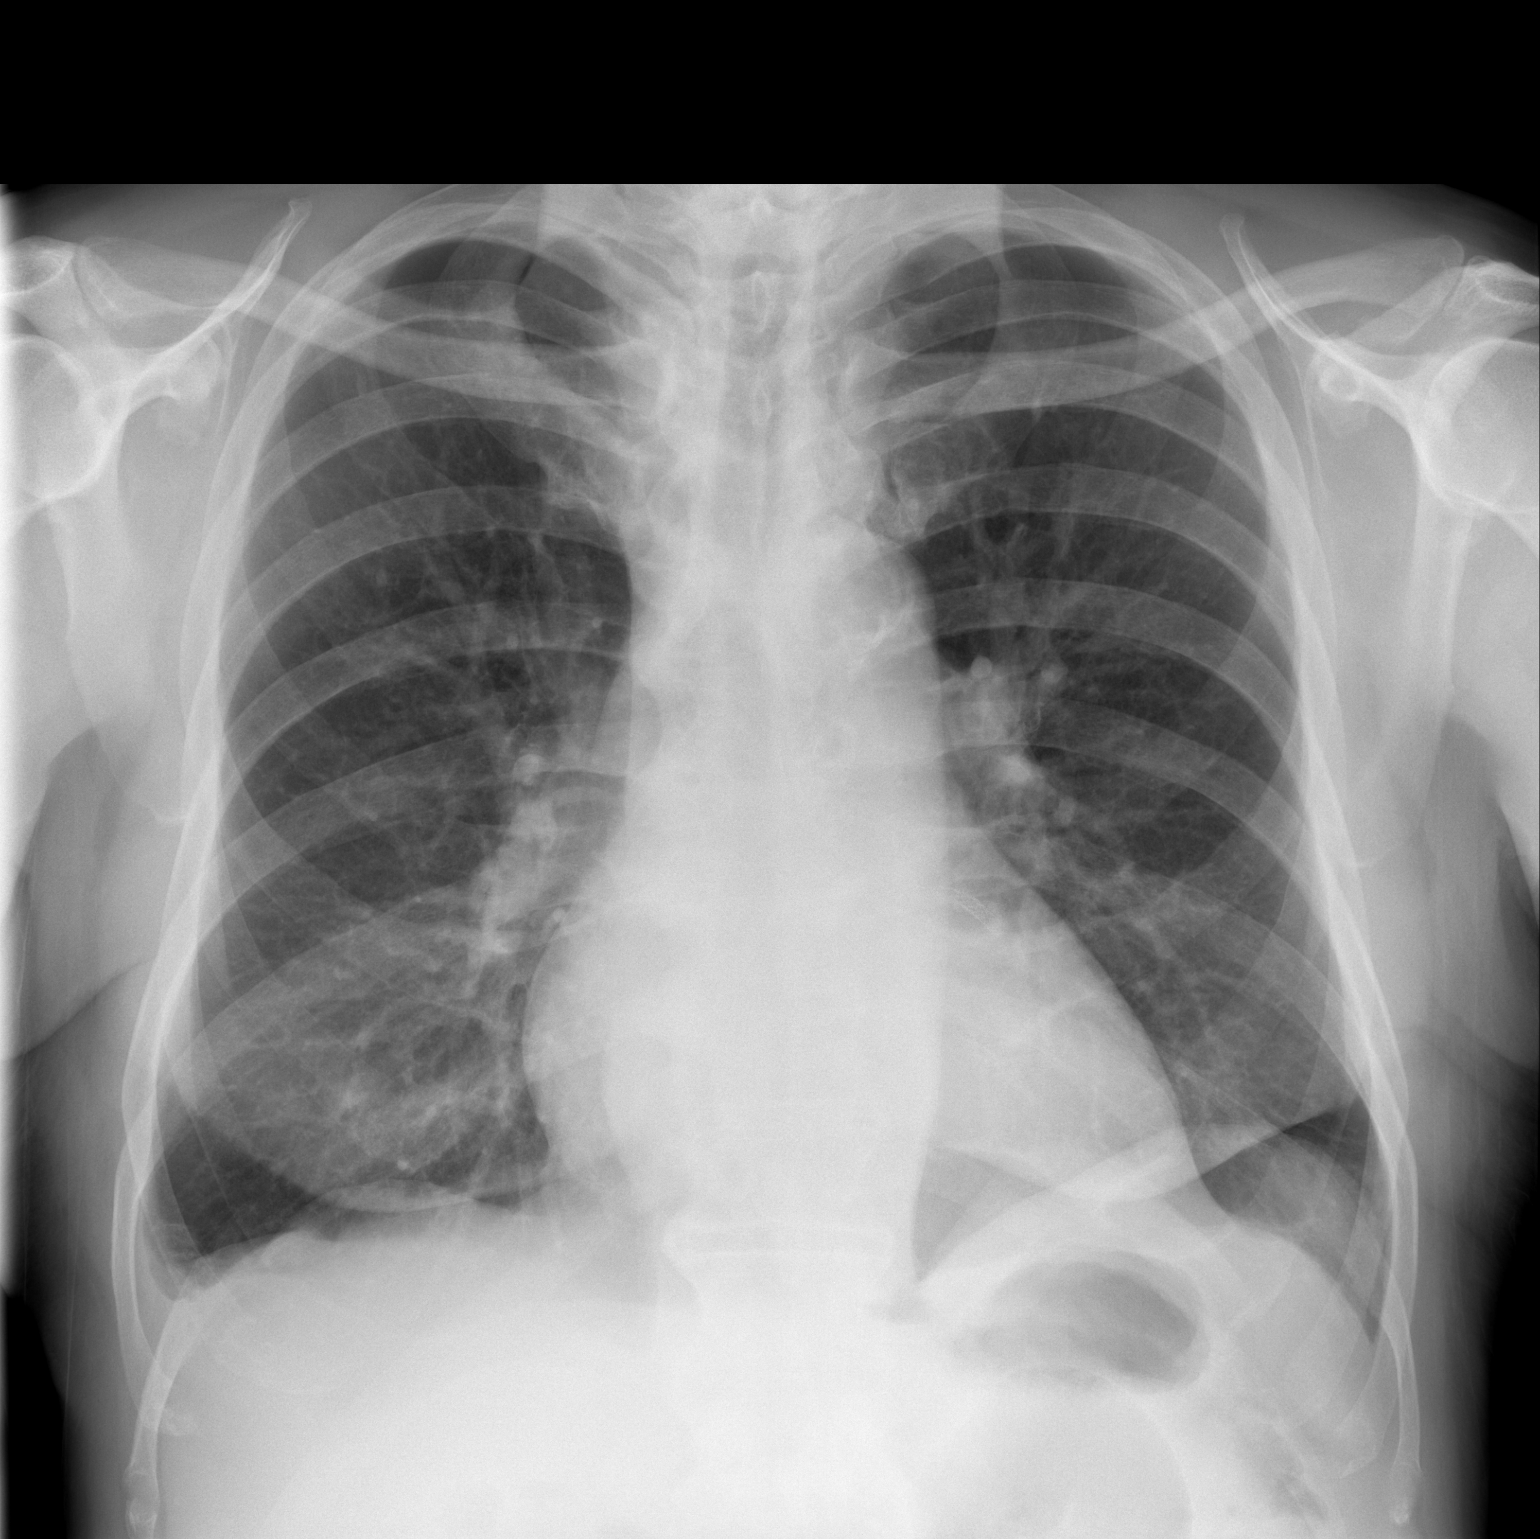

[w chest lat]
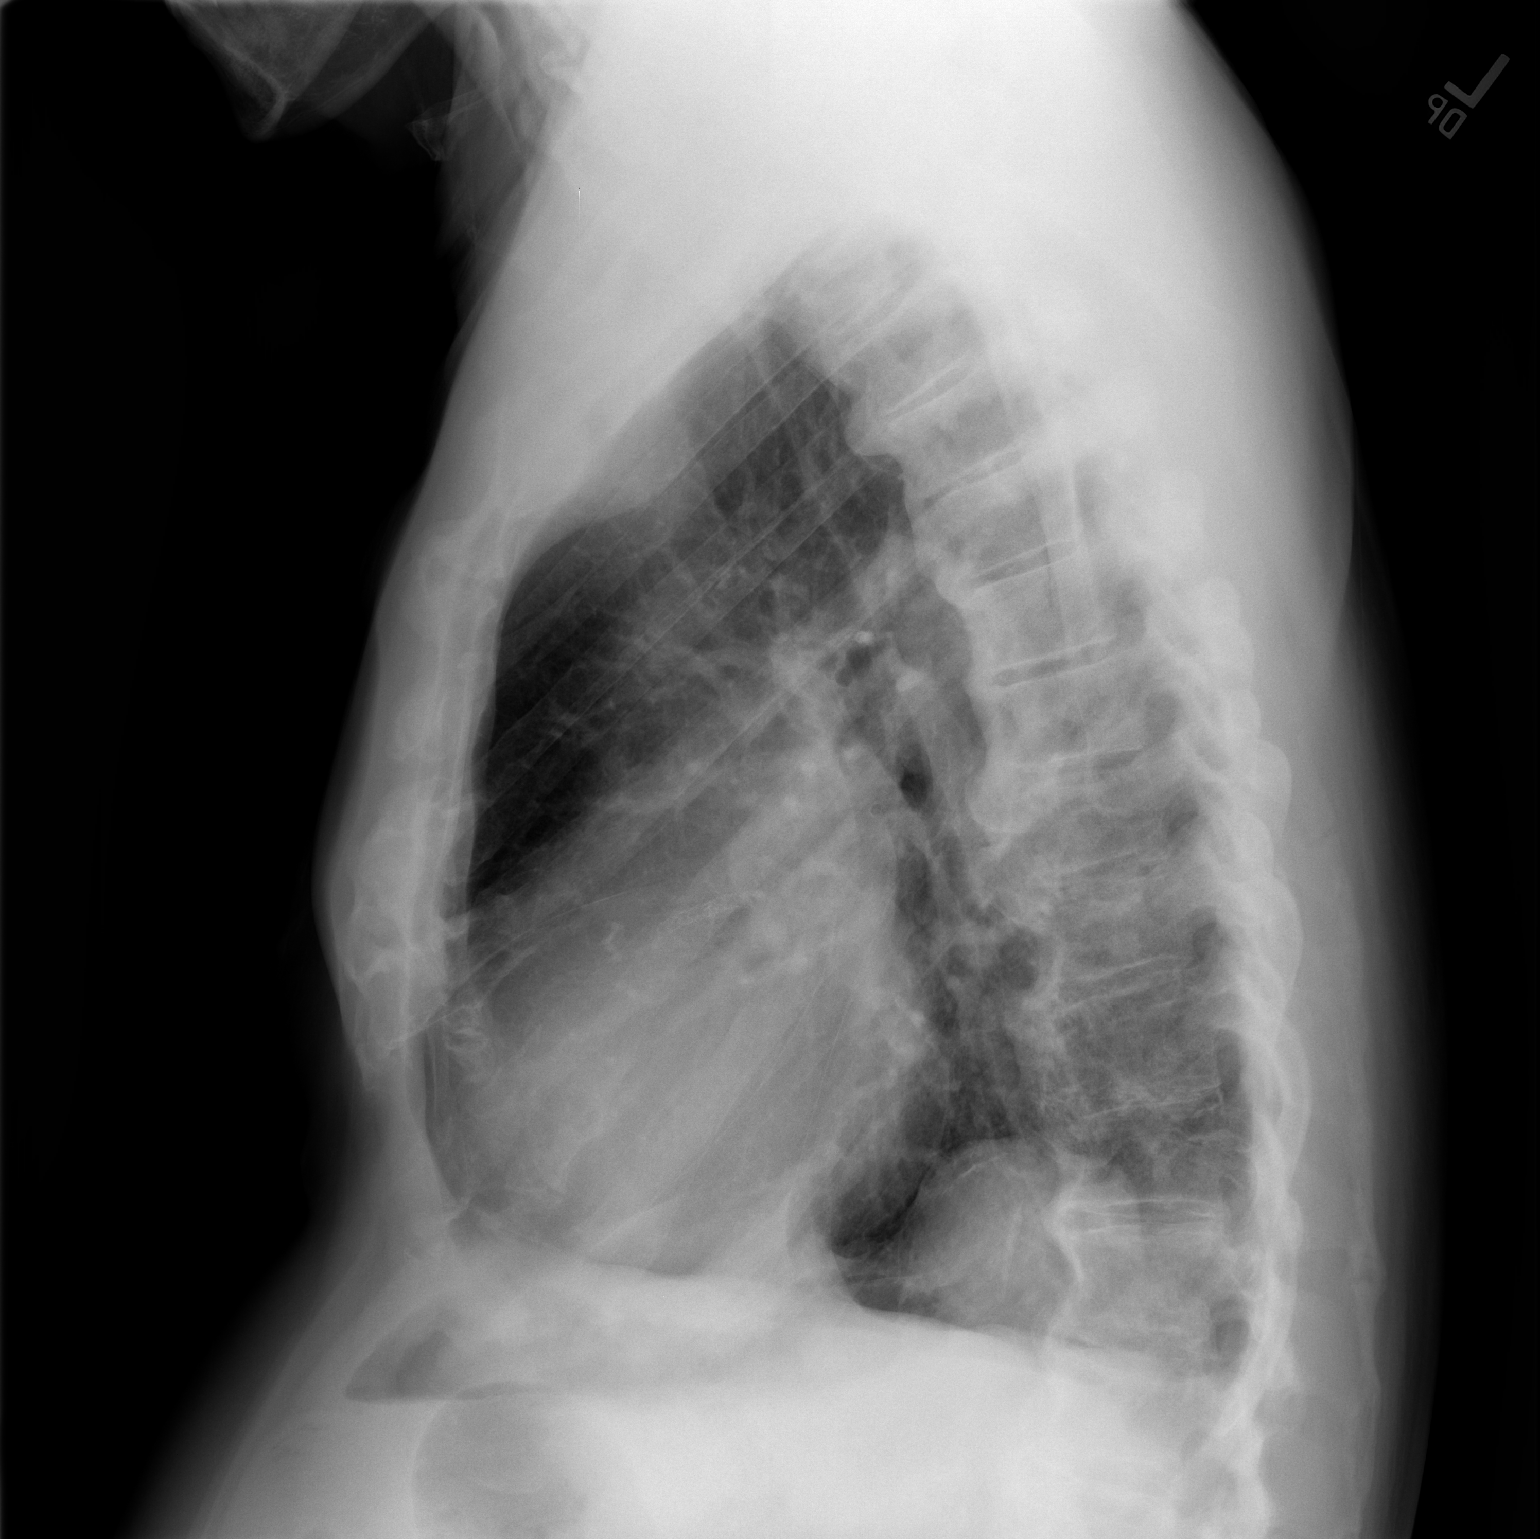

[2 of 2 positions shown; findings below may reference images not displayed]

FINDINGS: No focal infiltrate or effusion is seen. The lungs are slightly
hyperaerated. Mediastinal and hilar contours are unremarkable. No
sequela of prior tuberculous infection is evident. There is mild
cardiomegaly present. There is suspicion of a small hiatal hernia.
Eventration of the left hemidiaphragm is noted. There are diffuse
degenerative changes throughout the thoracic spine.
IMPRESSION: 1. No active infiltrate or effusion.
2. Cardiomegaly.
3. Suspect small hiatal hernia.

## 2016-08-22 DIAGNOSIS — C61 Malignant neoplasm of prostate: Secondary | ICD-10-CM | POA: Diagnosis not present

## 2016-08-22 DIAGNOSIS — C670 Malignant neoplasm of trigone of bladder: Secondary | ICD-10-CM | POA: Diagnosis not present

## 2016-08-22 DIAGNOSIS — R32 Unspecified urinary incontinence: Secondary | ICD-10-CM | POA: Diagnosis not present

## 2016-09-13 DIAGNOSIS — E0590 Thyrotoxicosis, unspecified without thyrotoxic crisis or storm: Secondary | ICD-10-CM | POA: Diagnosis not present

## 2016-09-13 DIAGNOSIS — H25813 Combined forms of age-related cataract, bilateral: Secondary | ICD-10-CM | POA: Diagnosis not present

## 2016-09-13 DIAGNOSIS — H401132 Primary open-angle glaucoma, bilateral, moderate stage: Secondary | ICD-10-CM | POA: Diagnosis not present

## 2016-09-13 DIAGNOSIS — H35033 Hypertensive retinopathy, bilateral: Secondary | ICD-10-CM | POA: Diagnosis not present

## 2017-05-01 DIAGNOSIS — E119 Type 2 diabetes mellitus without complications: Secondary | ICD-10-CM | POA: Diagnosis not present

## 2017-05-01 DIAGNOSIS — I10 Essential (primary) hypertension: Secondary | ICD-10-CM | POA: Diagnosis not present

## 2017-05-01 DIAGNOSIS — I2510 Atherosclerotic heart disease of native coronary artery without angina pectoris: Secondary | ICD-10-CM | POA: Diagnosis not present

## 2017-05-01 DIAGNOSIS — E785 Hyperlipidemia, unspecified: Secondary | ICD-10-CM | POA: Diagnosis not present

## 2017-05-01 DIAGNOSIS — Z8546 Personal history of malignant neoplasm of prostate: Secondary | ICD-10-CM | POA: Diagnosis not present

## 2017-05-01 DIAGNOSIS — N529 Male erectile dysfunction, unspecified: Secondary | ICD-10-CM | POA: Diagnosis not present

## 2017-07-22 DIAGNOSIS — I209 Angina pectoris, unspecified: Secondary | ICD-10-CM | POA: Diagnosis present

## 2017-07-22 NOTE — H&P (Signed)
OFFICE VISIT NOTES COPIED TO EPIC FOR DOCUMENTATION History of Present Illness Laverda Page MD; 07/04/2017 5:50 AM) Patient words: Last OV 05/29/2017; FU for test results.  The patient is a 71 year old male who presents for a follow-up for Coronary artery disease. Patient is referred to [place] for evaluation for known coronary artery disease. He has a history of coronary disease and prior MI with stent placement 3. Last stent placement was in 2003. Patient denies any chest pain or shortness of breath. Patient also has a past medical history of hypertension, hyperlipidemia, type 2 diabetes, and prostate cancer. He denies any leg edmea, symptoms of claudication or TIA.  Patient is a current 0.5 PPD smoker. He does exercise at the [facility] 3-4 times a week with walking and weight lifting and denies any specific complaints. No claudication, angina, dyspnea, however on further questioning does state that he knows his limitsand does not like to push harder, difficult historian but probably has slight dyspnea and chest pain. He has not used any sublingual nitroglycerin   Problem List/Past Medical Frances Furbish Wynetta Emery; 2017-07-20 12:58 PM) Laboratory examination (Z01.89)  Benign essential hypertension (I10)  Controlled type 2 diabetes mellitus without complication, without long-term current use of insulin (E11.9)  Hyperlipidemia, mixed (E78.2)  Tobacco user (Z72.0)  Bilateral carotid bruits (R09.89)  Carotid artery duplex 06/27/2017 No hemodynamically significant arterial disease in the internal carotid artery bilaterally. There is mild to moderate diffuse mixed plaque in the bulb. Antegrade right vertebral artery flow. Antegrade left vertebral artery flow. Systolic ejection murmur (R01.1)  Echocardiogram 06/27/2017: 1. Left ventricle cavity is normal in size. Moderate concentric remodeling of the left ventricle. Basal anterolateral and Mid anterolateral hypokinesis. Calculated EF 55%. Grade II diastolic  dysfunction. Elevated LAP. 2. Left atrial cavity is moderately dilated. 3. Right atrial cavity is mildly dilated. 4. Mild aortic, mitral and tricuspid regurgitation. 5. Pulmonary artery systolic pressure is estimated at 25- 30 mm Hg. 6. Mildly dilated ascending aorta measuring at 4.1 cm (1.9 cm/m2) Screening for abdominal aortic aneurysm (Z13.6)  Abdominal aortic duplex 06/27/2017 Normal abdominal aorta. No evidence of abdominal aortic aneurysm. Normal iliac velocity bilateral. Atherosclerosis of native coronary artery of native heart with angina pectoris (I25.119) [1995]: Previously followed by Dr. Lyla Son with coronary angiogram with stent placement x 3; 1995 restenosis led to PCI 2003 with DES Lexiscan myoview stress test 06/03/2017 1. Patient attempted treadmill and requested to stop. The resting electrocardiogram demonstrated normal sinus rhythm, normal resting conduction, no resting arrhythmias and normal rest repolarization. Stress EKG is non-diagnostic for ischemia as it a pharmacologic stress using Lexiscan. Stress symptoms included dyspnea and chest pressure. 2. SPECT images demonstrate Medium perfusion abnormality of moderate intensity in the basal inferior, mid inferior and apical inferior myocardial wall(s) on the stress images. The defect is present on the resting images consistent with prior myocardial infarction. SPECT images demonstrate Medium perfusion abnormality of mild intensity in the mid anterolateral and apical lateral myocardial wall(s) on the stress images. The defect is not present on the resting images consistent with ischemia. Gated SPECT imaging demonstrates akinesis of the basal inferior, mid inferior and apical inferior myocardial wall(s). The left ventricular ejection fraction was calculated or visually estimated to be 37%. This is a high risk study, consider further cardiac work-up. Compared to Nuclear stress testing 05/06/2002, inferior scar is new.  Allergies Frances Furbish Johnson;  July 20, 2017 12:58 PM) No Known Drug Allergies [05/29/2017]:  Family History Cheri Kearns; 2017/07/20 12:58 PM) Mother  Deceased. at age 19  from respiratory failure; no heart attacks or strokes, no known cardiovascular conditions Father  Deceased. at age 56 from unknown causes; no heart attacks or strokes, no known cardiovascular conditions Siblings  Patient has a total of 6 siblings, all but 1,are deceased with no heart attacks or strokes, no other known cardiovascular conditions  Social History Cheri Kearns; 07/03/2017 12:58 PM) Marital status  Married. Living Situation  Lives with spouse. Number of Children  2. Current tobacco use  Current every day smoker. 1/2 ppd  Past Surgical History Cheri Kearns; 07/03/2017 12:58 PM) Radiation Seeds Implanted in Prostate [2016]:  Medication History Anderson Malta Sergeant; 07/03/2017 1:12 PM) Valsartan (80MG Tablet, 1 Oral daily) Active. Aspirin EC (325MG Tablet DR, 1 Oral daily) Active. Atenolol (50MG Tablet, 1 Oral daily) Active. MetFORMIN HCl (1000MG Tablet, 1 Oral daily) Active. Multiple Vitamin (1 Oral daily) Active. Atorvastatin Calcium (20MG Tablet, 1 Oral daily) Active. Tamsulosin HCl (0.4MG Capsule, 1 Oral daily) Active. Travatan Z (0.004% Solution, 1 drop each eye Ophthalmic daily) Active. Medications Reconciled (verbally with pt; no list or medication present)  Diagnostic Studies History Anderson Malta Sergeant; 07/03/2017 12:33 PM) Echocardiogram [06/27/2017]: 1. Left ventricle cavity is normal in size. Moderate concentric remodeling of the left ventricle. Basal anterolateral and Mid anterolateral hypokinesis. Calculated EF 55%. Grade II diastolic dysfunction. Elevated LAP. 2. Left atrial cavity is moderately dilated. 3. Right atrial cavity is mildly dilated. 4. Mild aortic, mitral and tricuspid regurgitation. 5. Pulmonary artery systolic pressure is estimated at 25- 30 mm Hg. 6. Mildly dilated ascending aorta  measuring at 4.1 cm (1.9 cm/m2) Nuclear stress test [06/03/2017]: 1. Patient attempted treadmill and requested to stop. The resting electrocardiogram demonstrated normal sinus rhythm, normal resting conduction, no resting arrhythmias and normal rest repolarization. Stress EKG is non-diagnostic for ischemia as it a pharmacologic stress using Lexiscan. Stress symptoms included dyspnea and chest pressure. 2. SPECT images demonstrate Medium perfusion abnormality of moderate intensity in the basal inferior, mid inferior and apical inferior myocardial wall(s) on the stress images. The defect is present on the resting images consistent with prior myocardial infarction. SPECT images demonstrate Medium perfusion abnormality of mild intensity in the mid anterolateral and apical lateral myocardial wall(s) on the stress images. The defect is not present on the resting images consistent with ischemia. Gated SPECT imaging demonstrates akinesis of the basal inferior, mid inferior and apical inferior myocardial wall(s). The left ventricular ejection fraction was calculated or visually estimated to be 37%. This is a high risk study, consider further cardiac work-up. Compared to Nuclear stress testing 05/06/2002, inferior scar is new. Carotid Doppler [06/27/2017]: No hemodynamically significant arterial disease in the internal carotid artery bilaterally. There is mild to moderate diffuse mixed plaque in the bulb. Antegrade right vertebral artery flow. Antegrade left vertebral artery flow. Abdominal Aortic Duplex [06/27/2017]: Normal abdominal aorta. No evidence of abdominal aortic aneurysm. Normal iliac velocity bilateral.  Other Problems Frances Furbish Johnson; 07/03/2017 12:58 PM) Unspecified Diagnosis     Review of Systems Laverda Page, MD; 07/04/2017 5:51 AM) General Not Present- Appetite Loss and Weight Gain. Respiratory Not Present- Chronic Cough and Wakes up from Sleep Wheezing or Short of  Breath. Cardiovascular Not Present- Chest Pain, Claudications, Difficulty Breathing Lying Down, Difficulty Breathing On Exertion and Edema. Gastrointestinal Not Present- Black, Tarry Stool and Difficulty Swallowing. Musculoskeletal Not Present- Decreased Range of Motion and Muscle Atrophy. Neurological Not Present- Attention Deficit. Psychiatric Not Present- Personality Changes and Suicidal Ideation. Endocrine Not Present- Cold Intolerance and Heat Intolerance. Hematology Not Present- Abnormal  Bleeding. All other systems negative  Vitals Anderson Malta Sergeant; 07/03/2017 1:13 PM) 07/03/2017 1:08 PM Weight: 207.44 lb Height: 72in Body Surface Area: 2.16 m Body Mass Index: 28.13 kg/m  Pulse: 53 (Irregular)  P.OX: 99% (Room air) BP: 127/57 (Sitting, Left Arm, Standard)       Physical Exam Laverda Page MD; 07/03/2017 1:48 PM) General Mental Status-Alert. General Appearance-Cooperative and Appears stated age. Build & Nutrition-Well built and Well nourished.  Head and Neck Thyroid Gland Characteristics - normal size and consistency and no palpable nodules.  Chest and Lung Exam Chest and lung exam reveals -quiet, even and easy respiratory effort with no use of accessory muscles, non-tender and on auscultation, normal breath sounds, no adventitious sounds.  Cardiovascular Cardiovascular examination reveals -carotid auscultation reveals no bruits and abdominal aorta auscultation reveals no bruits and no prominent pulsation. Auscultation Murmurs & Other Heart Sounds: Murmur - Location - Apex. Grade - II/VI. Character - Systolic Ejection.  Abdomen Palpation/Percussion Normal exam - Non Tender and No hepatosplenomegaly.  Peripheral Vascular Lower Extremity Palpation - Femoral pulse - Bilateral - Normal. Popliteal pulse - Bilateral - Normal. Dorsalis pedis pulse - Left - 2+. Right - Absent. Posterior tibial pulse - Bilateral - Normal. Carotid arteries -  Bilateral-Harsh Bruit.  Neurologic Neurologic evaluation reveals -alert and oriented x 3 with no impairment of recent or remote memory. Motor-Grossly intact without any focal deficits.  Musculoskeletal Global Assessment Left Lower Extremity - no deformities, masses or tenderness, no known fractures. Right Lower Extremity - no deformities, masses or tenderness, no known fractures.    Assessment & Plan Laverda Page MD; 07/04/2017 5:56 AM) Atherosclerosis of native coronary artery of native heart with angina pectoris (I25.119) Story: Previously followed by Dr. Lyla Son with coronary angiogram with stent placement x 3; 1995 restenosis led to PCI 2003 with DES  Lexiscan myoview stress test 06/03/2017 1. Patient attempted treadmill and requested to stop. The resting electrocardiogram demonstrated normal sinus rhythm, normal resting conduction, no resting arrhythmias and normal rest repolarization. Stress EKG is non-diagnostic for ischemia as it a pharmacologic stress using Lexiscan. Stress symptoms included dyspnea and chest pressure. 2. SPECT images demonstrate Medium perfusion abnormality of moderate intensity in the basal inferior, mid inferior and apical inferior myocardial wall(s) on the stress images. The defect is present on the resting images consistent with prior myocardial infarction. SPECT images demonstrate Medium perfusion abnormality of mild intensity in the mid anterolateral and apical lateral myocardial wall(s) on the stress images. The defect is not present on the resting images consistent with ischemia. Gated SPECT imaging demonstrates akinesis of the basal inferior, mid inferior and apical inferior myocardial wall(s). The left ventricular ejection fraction was calculated or visually estimated to be 37%. This is a high risk study, consider further cardiac work-up. Compared to Nuclear stress testing 05/06/2002, inferior scar is new. Impression: EKG 05/29/2017: Normal sinus  rhythm at rate of 68 bpm, left atrial abnormality, normal axis. Poor progression, cannot exclude anterior infarct old. Inferior and lateral ST depression with T-wave inversion, LVH with repolarization, cannot exclude ischemia. PVCs. Future Plans 4/43/1540: METABOLIC PANEL, BASIC (08676) - one time 07/12/2017: CBC & PLATELETS (AUTO) (19509) - one time 07/12/2017: PT (PROTHROMBIN TIME) (32671) - one time Laboratory examination (Z01.89) Story: Labs 05/01/2017: Glucose 12:13, triglycerides 71, HDL 72, LDL 127. HB 13.3/HCT 40.7, normal indicis, platelets 326. HbA1c 5.5%, homocysteine 10.5, vitamin D 32.4, TSH normal, BUN 13, creatinine 1.15, potassium 4.6, CMP otherwise normal. EGFR 75 mL. Benign essential hypertension (I10) Controlled  type 2 diabetes mellitus without complication, without long-term current use of insulin (H84.6) Systolic ejection murmur (R01.1) Story: Echocardiogram 06/27/2017: 1. Left ventricle cavity is normal in size. Moderate concentric remodeling of the left ventricle. Basal anterolateral and Mid anterolateral hypokinesis. Calculated EF 55%. Grade II diastolic dysfunction. Elevated LAP. 2. Left atrial cavity is moderately dilated. 3. Right atrial cavity is mildly dilated. 4. Mild aortic, mitral and tricuspid regurgitation. Pulmonary artery systolic pressure is estimated at 25-30 mm Hg. 5. Mildly dilated ascending aorta measuring at 4.1 cm (1.9 cm/m2). Tobacco user (Z72.0) Current Plans Started [facility] Nicotine 21MG/24HR, 1 (one) Patch daily, 28 Patch, 07/03/2017, No Refill. Local Order: Refill 14 mg after 4 weeks Started [facility] Nicotine 14MG/24HR, 1 (one) Patch daily, 28 Patch, 07/03/2017, No Refill. Local Order: After 21 mg dose used Bilateral carotid bruits (R09.89) Story: Carotid artery duplex 06/27/2017 No hemodynamically significant arterial disease in the internal carotid artery bilaterally. There is mild to moderate diffuse mixed plaque in the bulb. Antegrade right vertebral  artery flow. Antegrade left vertebral artery flow. Screening for abdominal aortic aneurysm (Z13.6) Story: Abdominal aortic duplex 06/27/2017 Normal abdominal aorta. No evidence of abdominal aortic aneurysm. Normal iliac velocity bilateral. Hyperlipidemia, mixed (E78.2)  Note:- Recommendations:  I have reviewed the results of the recently performed stress test, showing new anterior wall ischemia and mildly dilated left ventricle, associated chest pain with Lexiscan infusion and high risk stress test here on further questioning, although patient has not had any frank chest pain, he clearly has reduced his activity so as not to reproduce any symptoms. We discussed regarding medical therapy versus cardiac catheterization, he wishes to proceed with coronary angiography. Continued tobcco use disorder: I have again reinforced abstinance. Unfortunatly continues to use. Now willing to quit and I Rx nicotine patches. His LDL is not at goal but recent restart of lipitor. DM is controlled.  Schedule for cardiac catheterization, and possible angioplasty. We discussed regarding risks, benefits, alternatives to this including stress testing, CTA and continued medical therapy. Patient wants to proceed. Understands <1-2% risk of death, stroke, MI, urgent CABG, bleeding, infection, renal failure but not limited to these. Office visit after the tests.  CC Dr. Iona Beard Osei-Bonsu.    Signed by Laverda Page, MD (07/04/2017 5:57 AM) .

## 2017-07-23 ENCOUNTER — Ambulatory Visit (HOSPITAL_COMMUNITY)
Admission: RE | Admit: 2017-07-23 | Discharge: 2017-07-23 | Disposition: A | Discharge status: Home / Self Care | Payer: Medicare Other | Source: Ambulatory Visit | Attending: Cardiology | Admitting: Cardiology

## 2017-07-23 ENCOUNTER — Encounter (HOSPITAL_COMMUNITY): Payer: Self-pay | Admitting: *Deleted

## 2017-07-23 ENCOUNTER — Ambulatory Visit (HOSPITAL_COMMUNITY)
Admission: RE | Disposition: A | Discharge status: Home / Self Care | Payer: Self-pay | Source: Ambulatory Visit | Attending: Cardiology

## 2017-07-23 DIAGNOSIS — I10 Essential (primary) hypertension: Secondary | ICD-10-CM | POA: Insufficient documentation

## 2017-07-23 DIAGNOSIS — I209 Angina pectoris, unspecified: Secondary | ICD-10-CM | POA: Diagnosis present

## 2017-07-23 DIAGNOSIS — Z7984 Long term (current) use of oral hypoglycemic drugs: Secondary | ICD-10-CM | POA: Insufficient documentation

## 2017-07-23 DIAGNOSIS — Z7982 Long term (current) use of aspirin: Secondary | ICD-10-CM | POA: Diagnosis not present

## 2017-07-23 DIAGNOSIS — I252 Old myocardial infarction: Secondary | ICD-10-CM | POA: Insufficient documentation

## 2017-07-23 DIAGNOSIS — T82855A Stenosis of coronary artery stent, initial encounter: Secondary | ICD-10-CM | POA: Insufficient documentation

## 2017-07-23 DIAGNOSIS — Y831 Surgical operation with implant of artificial internal device as the cause of abnormal reaction of the patient, or of later complication, without mention of misadventure at the time of the procedure: Secondary | ICD-10-CM | POA: Insufficient documentation

## 2017-07-23 DIAGNOSIS — I2582 Chronic total occlusion of coronary artery: Secondary | ICD-10-CM | POA: Diagnosis not present

## 2017-07-23 DIAGNOSIS — F17210 Nicotine dependence, cigarettes, uncomplicated: Secondary | ICD-10-CM | POA: Insufficient documentation

## 2017-07-23 DIAGNOSIS — I2584 Coronary atherosclerosis due to calcified coronary lesion: Secondary | ICD-10-CM | POA: Insufficient documentation

## 2017-07-23 DIAGNOSIS — E119 Type 2 diabetes mellitus without complications: Secondary | ICD-10-CM | POA: Insufficient documentation

## 2017-07-23 DIAGNOSIS — I25118 Atherosclerotic heart disease of native coronary artery with other forms of angina pectoris: Secondary | ICD-10-CM | POA: Insufficient documentation

## 2017-07-23 DIAGNOSIS — E782 Mixed hyperlipidemia: Secondary | ICD-10-CM | POA: Insufficient documentation

## 2017-07-23 HISTORY — PX: INTRAVASCULAR PRESSURE WIRE/FFR STUDY: CATH118243

## 2017-07-23 HISTORY — PX: LEFT HEART CATH AND CORONARY ANGIOGRAPHY: CATH118249

## 2017-07-23 LAB — POCT ACTIVATED CLOTTING TIME
ACTIVATED CLOTTING TIME: 230 s
Activated Clotting Time: 219 seconds
Activated Clotting Time: 340 seconds

## 2017-07-23 LAB — GLUCOSE, CAPILLARY: GLUCOSE-CAPILLARY: 82 mg/dL (ref 65–99)

## 2017-07-23 SURGERY — LEFT HEART CATH AND CORONARY ANGIOGRAPHY
Anesthesia: LOCAL

## 2017-07-23 MED ORDER — MIDAZOLAM HCL 2 MG/2ML IJ SOLN
INTRAMUSCULAR | Status: AC
  Filled 2017-07-23: qty 2

## 2017-07-23 MED ORDER — LIDOCAINE HCL (PF) 1 % IJ SOLN
INTRAMUSCULAR | Status: AC
  Filled 2017-07-23: qty 30

## 2017-07-23 MED ORDER — ADENOSINE 12 MG/4ML IV SOLN
INTRAVENOUS | Status: AC
  Filled 2017-07-23: qty 12

## 2017-07-23 MED ORDER — IOPAMIDOL (ISOVUE-370) INJECTION 76%
INTRAVENOUS | Status: AC
  Filled 2017-07-23: qty 50

## 2017-07-23 MED ORDER — ADENOSINE (DIAGNOSTIC) 140MCG/KG/MIN
INTRAVENOUS | Status: DC | PRN
  Administered 2017-07-23: 140 ug/kg/min via INTRAVENOUS

## 2017-07-23 MED ORDER — VERAPAMIL HCL 2.5 MG/ML IV SOLN
INTRA_ARTERIAL | Status: DC | PRN
  Administered 2017-07-23: 15:00:00 via INTRA_ARTERIAL
  Administered 2017-07-23: 15 mL via INTRA_ARTERIAL

## 2017-07-23 MED ORDER — MIDAZOLAM HCL 2 MG/2ML IJ SOLN
INTRAMUSCULAR | Status: DC | PRN
  Administered 2017-07-23: 1 mg via INTRAVENOUS

## 2017-07-23 MED ORDER — FENTANYL CITRATE (PF) 100 MCG/2ML IJ SOLN
INTRAMUSCULAR | Status: AC
  Filled 2017-07-23: qty 2

## 2017-07-23 MED ORDER — HEPARIN SODIUM (PORCINE) 1000 UNIT/ML IJ SOLN
INTRAMUSCULAR | Status: DC | PRN
  Administered 2017-07-23: 2000 [IU] via INTRAVENOUS
  Administered 2017-07-23: 4000 [IU] via INTRAVENOUS
  Administered 2017-07-23: 2000 [IU] via INTRAVENOUS
  Administered 2017-07-23: 6000 [IU] via INTRAVENOUS

## 2017-07-23 MED ORDER — METFORMIN HCL 1000 MG PO TABS: 1000.0000 mg | ORAL_TABLET | Freq: Every day | ORAL | Status: DC

## 2017-07-23 MED ORDER — HEPARIN SODIUM (PORCINE) 1000 UNIT/ML IJ SOLN
INTRAMUSCULAR | Status: AC
  Filled 2017-07-23: qty 1

## 2017-07-23 MED ORDER — SODIUM CHLORIDE 0.9% FLUSH: 3.0000 mL | Freq: Two times a day (BID) | INTRAVENOUS | Status: DC

## 2017-07-23 MED ORDER — FENTANYL CITRATE (PF) 100 MCG/2ML IJ SOLN
INTRAMUSCULAR | Status: DC | PRN
Start: 2017-07-23 — End: 2017-07-23
  Administered 2017-07-23: 25 ug via INTRAVENOUS

## 2017-07-23 MED ORDER — IOPAMIDOL (ISOVUE-370) INJECTION 76%
INTRAVENOUS | Status: AC
  Filled 2017-07-23: qty 100

## 2017-07-23 MED ORDER — SODIUM CHLORIDE 0.9% FLUSH: 3.0000 mL | INTRAVENOUS | Status: DC | PRN

## 2017-07-23 MED ORDER — ASPIRIN 81 MG PO CHEW: 81.0000 mg | CHEWABLE_TABLET | ORAL | Status: DC

## 2017-07-23 MED ORDER — IOPAMIDOL (ISOVUE-370) INJECTION 76%
INTRAVENOUS | Status: AC
Start: 2017-07-23 — End: ?
  Filled 2017-07-23: qty 50

## 2017-07-23 MED ORDER — HEPARIN (PORCINE) IN NACL 2-0.9 UNIT/ML-% IJ SOLN
INTRAMUSCULAR | Status: AC | PRN
  Administered 2017-07-23: 1000 mL via INTRA_ARTERIAL

## 2017-07-23 MED ORDER — HYDRALAZINE HCL 20 MG/ML IJ SOLN
INTRAMUSCULAR | Status: AC
  Filled 2017-07-23: qty 1

## 2017-07-23 MED ORDER — ADENOSINE 12 MG/4ML IV SOLN
INTRAVENOUS | Status: AC
  Filled 2017-07-23: qty 16

## 2017-07-23 MED ORDER — NITROGLYCERIN 1 MG/10 ML FOR IR/CATH LAB
INTRA_ARTERIAL | Status: AC
  Filled 2017-07-23: qty 10

## 2017-07-23 MED ORDER — SODIUM CHLORIDE 0.9 % IV SOLN: 250.0000 mL | INTRAVENOUS | Status: DC | PRN

## 2017-07-23 MED ORDER — SODIUM CHLORIDE 0.9 % WEIGHT BASED INFUSION
1.0000 mL/kg/h | INTRAVENOUS | Status: DC
  Administered 2017-07-23: 1 mL/kg/h via INTRAVENOUS

## 2017-07-23 MED ORDER — SODIUM CHLORIDE 0.9 % WEIGHT BASED INFUSION
3.0000 mL/kg/h | INTRAVENOUS | Status: DC
  Administered 2017-07-23: 3 mL/kg/h via INTRAVENOUS

## 2017-07-23 MED ORDER — SODIUM CHLORIDE 0.9 % WEIGHT BASED INFUSION: 1.0000 mL/kg/h | INTRAVENOUS | Status: DC

## 2017-07-23 MED ORDER — IOPAMIDOL (ISOVUE-370) INJECTION 76%
INTRAVENOUS | Status: DC | PRN
  Administered 2017-07-23: 155 mL via INTRA_ARTERIAL

## 2017-07-23 MED ORDER — HEPARIN (PORCINE) IN NACL 2-0.9 UNIT/ML-% IJ SOLN
INTRAMUSCULAR | Status: AC
  Filled 2017-07-23: qty 1000

## 2017-07-23 MED ORDER — LIDOCAINE HCL (PF) 1 % IJ SOLN
INTRAMUSCULAR | Status: DC | PRN
  Administered 2017-07-23: 2 mL

## 2017-07-23 MED ORDER — VERAPAMIL HCL 2.5 MG/ML IV SOLN
INTRAVENOUS | Status: AC
  Filled 2017-07-23: qty 2

## 2017-07-23 SURGICAL SUPPLY — 18 items
CATH LAUNCHER 6FR EBU3.5 (CATHETERS) ×1 IMPLANT
CATH LAUNCHER 6FR JL4 (CATHETERS) ×1 IMPLANT
CATH OPTITORQUE TIG 4.0 5F (CATHETERS) ×1 IMPLANT
DEVICE RAD COMP TR BAND LRG (VASCULAR PRODUCTS) ×1 IMPLANT
GLIDESHEATH SLEND A-KIT 6F 20G (SHEATH) ×1 IMPLANT
GUIDE CATH RUNWAY 6FR VL4 (CATHETERS) ×1 IMPLANT
GUIDEWIRE INQWIRE 1.5J.035X260 (WIRE) IMPLANT
GUIDEWIRE PRESSURE COMET II (WIRE) ×1 IMPLANT
INQWIRE 1.5J .035X260CM (WIRE) ×2
KIT HEART LEFT (KITS) ×2 IMPLANT
PACK CARDIAC CATHETERIZATION (CUSTOM PROCEDURE TRAY) ×2 IMPLANT
TRANSDUCER W/STOPCOCK (MISCELLANEOUS) ×2 IMPLANT
TUBING CIL FLEX 10 FLL-RA (TUBING) ×2 IMPLANT
TUBING HIGH PRESS EXTEN 6IN (TUBING) ×1 IMPLANT
VALVE GUARDIAN II ~~LOC~~ HEMO (MISCELLANEOUS) ×1 IMPLANT
WIRE ASAHI FIELDER XT 190CM (WIRE) ×1 IMPLANT
WIRE HI TORQ BMW 190CM (WIRE) ×1 IMPLANT
WIRE PT2 MS 185 (WIRE) ×1 IMPLANT

## 2017-07-23 NOTE — Interval H&P Note (Signed)
History and Physical Interval Note:  07/23/2017 2:23 PM  Corey Beard  has presented today for surgery, with the diagnosis of abnormal stress test  The various methods of treatment have been discussed with the patient and family. After consideration of risks, benefits and other options for treatment, the patient has consented to  Procedure(s): LEFT HEART CATH AND CORONARY ANGIOGRAPHY (N/A) as a surgical intervention .  The patient's history has been reviewed, patient examined, no change in status, stable for surgery.  I have reviewed the patient's chart and labs.  Questions were answered to the patient's satisfaction.     1 Vessel Disease PCI CABG   No proximal LAD involvement, No proximal left dominant LCX involvement A (8); Indication 2 M (6); Indication 2   Proximal left dominant LCX involvement A (8); Indication 5 A (8); Indication 5   Proximal LAD involvement A (8); Indication 5 A (8); Indication 5   newline 2 Vessel Disease  No proximal LAD involvement A (8); Indication 8 A (7); Indication 8   Proximal LAD involvement A (8); Indication 14 A (9); Indication 14   newline 3 Vessel Disease  Low disease complexity (e.g., focal stenoses, SYNTAX <=22) A (7); Indication 19 A (9); Indication 19   Intermediate or high disease complexity (e.g., SYNTAX >=23) M (6); Indication 23 A (9); Indication 23   newline Left Main Disease  Isolated LMCA disease: ostial or midshaft A (7); Indication 24 A (9); Indication 24   Isolated LMCA disease: bifurcation involvement M (6); Indication 25 A (9); Indication 25   LMCA ostial or midshaft, concurrent low disease burden multivessel disease (e.g., 1-2 additional focal stenoses, SYNTAX <=22) A (7); Indication 26 A (9); Indication 26   LMCA ostial or midshaft, concurrent intermediate or high disease burden multivessel disease (e.g., 1-2 additional bifurcation stenoses, long stenoses, SYNTAX >=23) M (4); Indication 27 A (9); Indication 27   LMCA bifurcation  involvement, concurrent low disease burden multivessel disease (e.g., 1-2 additional focal stenoses, SYNTAX <=22) M (6); Indication 28 A (9); Indication 28   LMCA bifurcation involvement, concurrent intermediate or high disease burden multivessel disease (e.g., 1-2 additional bifurcation stenoses, long stenoses, SYNTAX >=23) R (3); Indication 29 A (9); Indication [place]

## 2017-07-23 NOTE — Discharge Instructions (Signed)

## 2017-07-24 ENCOUNTER — Encounter (HOSPITAL_COMMUNITY): Payer: Self-pay | Admitting: Cardiology

## 2017-07-25 ENCOUNTER — Encounter (HOSPITAL_COMMUNITY): Payer: Self-pay | Admitting: Cardiology

## 2017-07-25 MED FILL — Adenosine IV Soln 12 MG/4ML: INTRAVENOUS | Qty: 12 | Status: AC

## 2019-02-01 NOTE — Progress Notes (Deleted)
Subjective:  Primary Physician:  Benito Mccreedy, MD  Patient ID: Corey Beard, male    DOB: [birth year], 73 y.o.   MRN: [id number]  No chief complaint on file.   HPI: Corey Beard  is a 73 y.o. male with known coronary artery disease, last coronary angiography was on 07/23/2017 which had revealed moderate disease in the right with negative FFR and occluded D1 at the LAD stent and was collateralized, normal LVEF and recommended medical therapy. He also has well controlled diabetes mellitus, hyperlipidemia, hypertension and history of prostate cancer, and tobacco use disorder in the form of smoking cigarettes. He now presents here for 6 month office visit.  He is presently doing well and denies any chest pain or shortness of breath and is found that since exercising on a regular basis his endurance has increased. He has not used any sublingual nitroglycerin in the past 6 months. He recently had complete physical and was told to have normal lipids and diabetes is also well controlled.  Past Medical History:  Diagnosis Date   Arthritis    Coronary artery disease pt sees PCP ----cardiologist-  dr Terrence Dupont--  last office visit 2014 per pt (  called dr Terrence Dupont office pt last visit wast in 2004   hx MI--  s/p angioplasty's and stenting   Glaucoma, both eyes    History of MI (myocardial infarction)    2002   Hypertension    Nocturia    PAF (paroxysmal atrial fibrillation) ([place])    Prostate cancer [facility]) urologist-- dr eskridge    T1c, Gleason 3+3, PSA 9.83, vol 61cc   S/P drug eluting coronary stent placement    Type 2 diabetes mellitus ([place])    Wears glasses     Past Surgical History:  Procedure Laterality Date   CORONARY ANGIOPLASTY  07-01-2001  dr  Terrence Dupont   PCI to  pLAD, mRCA, ostium D1   CORONARY ANGIOPLASTY  12-04-2001  dr Terrence Dupont   PCI  to  ostial D1,  pLAD   CORONARY ANGIOPLASTY WITH STENT PLACEMENT  09-03-2002  dr Terrence Dupont   PCI and DES x1 to pLAD    CYSTOSCOPY WITH BIOPSY  10/04/2015   Procedure: CYSTOSCOPY WITH BIOPSY;  Surgeon: Festus Aloe, MD;  Location: [facility];  Service: Urology;;   INTRAVASCULAR PRESSURE WIRE/FFR STUDY N/A 07/23/2017   Procedure: INTRAVASCULAR PRESSURE WIRE/FFR STUDY;  Surgeon: Adrian Prows, MD;  Location: [place] [facility];  Service: Cardiovascular;  Laterality: N/A;   LEFT HEART CATH AND CORONARY ANGIOGRAPHY N/A 07/23/2017   Procedure: LEFT HEART CATH AND CORONARY ANGIOGRAPHY;  Surgeon: Adrian Prows, MD;  Location: [place] [facility];  Service: Cardiovascular;  Laterality: N/A;   RADIOACTIVE SEED IMPLANT N/A 10/04/2015   Procedure: RADIOACTIVE SEED IMPLANT/BRACHYTHERAPY IMPLANT;  Surgeon: Festus Aloe, MD;  Location: [facility];  Service: Urology;  Laterality: N/A;   REPAIR  INCARCERATED EPIGASTRIC HERNIA  06-04-2002    Social History   Socioeconomic History   Marital status: Married    Spouse name: Not on file   Number of children: 2   Years of education: Not on file   Highest education level: Not on file  Occupational History   Occupation: Physician  Social Needs   Financial resource strain: Not on file   Food insecurity:    Worry: Not on file    Inability: Not on file   Transportation needs:    Medical: Not on file    Non-medical: Not  on file  Tobacco Use   Smoking status: Current Every Day Smoker    Packs/day: 0.50    Years: 13.00    Pack years: 6.50    Types: Cigarettes   Smokeless tobacco: Never Used  Substance and Sexual Activity   Alcohol use: No    Alcohol/week: 0.0 standard drinks   Drug use: No   Sexual activity: Not on file  Lifestyle   Physical activity:    Days per week: Not on file    Minutes per session: Not on file   Stress: Not on file  Relationships   Social connections:    Talks on phone: Not on file    Gets together: Not on file    Attends religious service: Not on file    Active member of club or  organization: Not on file    Attends meetings of clubs or organizations: Not on file    Relationship status: Not on file   Intimate partner violence:    Fear of current or ex partner: Not on file    Emotionally abused: Not on file    Physically abused: Not on file    Forced sexual activity: Not on file  Other Topics Concern   Not on file  Social History Narrative   Not on file    Current Outpatient Medications on File Prior to Visit  Medication Sig Dispense Refill   aspirin EC 325 MG tablet Take 325 mg by mouth daily.     atenolol (TENORMIN) 50 MG tablet Take 50 mg by mouth every morning.      atorvastatin (LIPITOR) 20 MG tablet Take 20 mg by mouth daily.     Cholecalciferol (VITAMIN D3) 1000 UNITS CAPS Take 1,000 Units by mouth daily.      metFORMIN (GLUCOPHAGE) 1000 MG tablet Take 1 tablet (1,000 mg total) by mouth daily with breakfast.     Multiple Vitamins-Minerals (MULTIVITAMIN PO) Take 1 tablet by mouth daily.     TRAVATAN Z 0.004 % SOLN ophthalmic solution Place 1 drop into both eyes at bedtime.  0   valsartan (DIOVAN) 80 MG tablet Take 80 mg by mouth daily.     No current facility-administered medications on file prior to visit.     ***Review of Systems  Constitutional: Negative for malaise/fatigue and weight loss.  Respiratory: Positive for cough (Chronic, attributes to smoking and possibly benicar). Negative for hemoptysis and shortness of breath.   Cardiovascular: Negative for chest pain, palpitations, claudication and leg swelling.  Gastrointestinal: Negative for abdominal pain, blood in stool, constipation, heartburn and vomiting.  Genitourinary: Negative for dysuria.  Musculoskeletal: Negative for joint pain and myalgias.  Neurological: Negative for dizziness, focal weakness and headaches.  Endo/Heme/Allergies: Does not bruise/bleed easily.  Psychiatric/Behavioral: Negative for depression. The patient is not nervous/anxious.   All other systems reviewed  and are negative.      Objective:  There were no vitals taken for this visit. There is no height or weight on file to calculate BMI.  ***Physical Exam  Constitutional: He appears well-developed and well-nourished. No distress.  HENT:  Head: Atraumatic.  Eyes: Conjunctivae are normal.  Neck: Neck supple. No JVD present. No thyromegaly present.  Cardiovascular: Normal rate, regular rhythm, S1 normal, S2 normal, normal heart sounds and intact distal pulses.  Extrasystoles are present. Exam reveals no gallop.  No murmur heard. Pulses:      Carotid pulses are on the right side with bruit and on the left side with bruit. II/VI  SEM in the [place]  Pulmonary/Chest: Effort normal and breath sounds normal.  Abdominal: Soft. Bowel sounds are normal.  Musculoskeletal: Normal range of motion.        General: No edema.  Neurological: He is alert.  Skin: Skin is warm and dry.  Psychiatric: He has a normal mood and affect.    CARDIAC STUDIES:   *** EKG 01/30/2018: Normal sinus rhythm at the rate of 60 bpm, left atrial enlargement, normal axis. LVH with repolarization abnormality, cannot exclude inferior and lateral ischemia. Frequent PVCs, unifocal. No significant change from EKG 07/31/2017, EKG 05/29/2017.  Coronary angiogram 07/23/2017: Moderate disease in the right coronary artery. LAD diffuse disease, proximal to the stent is a 50% stenosis at most 60%, negative FFR. Occluded sandwiched D1 at the LAD stent and diagonal stent. Collaterals present. Mild disease in circumflex and ramus. Medical therapy. (stent 1995, 2003)   Carotid artery duplex 06/27/2017: No hemodynamically significant arterial disease in the internal carotid artery bilaterally. There is mild to moderate diffuse mixed plaque in the bulb. Antegrade right vertebral artery flow. Antegrade left vertebral artery flow.  Echocardiogram 06/27/2017: 1. Left ventricle cavity is normal in size. Moderate concentric remodeling of the left  ventricle. Basal anterolateral and Mid anterolateral hypokinesis. Calculated EF 55%. Grade II diastolic dysfunction. Elevated LAP. 2. Left atrial cavity is moderately dilated. 3. Right atrial cavity is mildly dilated. 4. Mild aortic, mitral and tricuspid regurgitation. Pulmonary artery systolic pressure is estimated at 25-30 mm Hg. 5. Mildly dilated ascending aorta measuring at 4.1 cm (1.9 cm/m2).  Abdominal aortic duplex 06/27/2017: Normal abdominal aorta. No evidence of abdominal aortic aneurysm. Normal iliac velocity bilateral.  Lexiscan myoview stress test 06/03/2017: 1. Patient attempted treadmill and requested to stop. The resting electrocardiogram demonstrated normal sinus rhythm, normal resting conduction, no resting arrhythmias and normal rest repolarization. Stress EKG is non-diagnostic for ischemia as it a pharmacologic stress using Lexiscan. Stress symptoms included dyspnea and chest pressure. 2. SPECT images demonstrate Medium perfusion abnormality of moderate intensity in the basal inferior, mid inferior and apical inferior myocardial wall(s) on the stress images. The defect is present on the resting images consistent with prior myocardial infarction. SPECT images demonstrate Medium perfusion abnormality of mild intensity in the mid anterolateral and apical lateral myocardial wall(s) on the stress images. The defect is not present on the resting images consistent with ischemia. Gated SPECT imaging demonstrates akinesis of the basal inferior, mid inferior and apical inferior myocardial wall(s). The left ventricular ejection fraction was calculated or visually estimated to be 37%. This is a high risk study, consider further cardiac work-up. Compared to Nuclear stress testing 05/06/2002, inferior scar is new.   Recent Labs:  *** 07/18/2017: Creatinine 1.26, EGFR 58/67, potassium 4.3, BMP otherwise normal. Hemoglobin 12.8, hematocrit normal at 39.6, RDW 16.6%, CBC otherwise normal. INR 1.0.  Prothrombin time 10.5.   05/01/2017: Glucose 12:13, triglycerides 71, HDL 72, LDL 127. HB 13.3/HCT 40.7, normal indicis, platelets 326. HbA1c 5.5%, homocysteine 10.5, vitamin D 32.4, TSH normal, BUN 13, creatinine 1.15, potassium 4.6, CMP otherwise normal. EGFR 75 mL.   Assessment & Recommendations:   There are no diagnoses linked to this encounter.  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 He also has well controlled diabetes mellitus, hyperlipidemia,  hypertension and history of prostate cancer, and tobacco use disorder in the form of smoking cigarettes. He now presents here for 6 month office visit.  He is presently doing well and denies any chest pain or shortness of breath. He has not used any sublingual nitroglycerin in the past 6 months. He recently had complete physical and was told to have normal lipids and diabetes is also well controlled. He does have frequent PVCs on the EKG, but essentially asymptomatic.  He thinks that Benicar may be causing him cough, attributes to smoking and possibly benicar. he did not take it last night hence the blood pressure slightly elevated. Advised him to hold off on taking Benicar for the next 2 weeks and if he has resolution of cough then he can discontinue Benicar and spironolactone may be a good choice. However if there is no change, advised him to restart Benicar and he may need an milligrams as his blood pressure was slightly elevated. Again discussed complete abstinance from cigarettes.     Adrian Prows, MD, [facility] 02/01/2019, 8:45 PM [facility] [place] Pager: [phone] Office: [phone] If no answer Cell [phone]

## 2019-02-02 ENCOUNTER — Ambulatory Visit: Payer: Self-pay | Admitting: Cardiology

## 2019-03-13 ENCOUNTER — Encounter: Payer: Self-pay | Admitting: Cardiology

## 2019-03-13 ENCOUNTER — Other Ambulatory Visit: Payer: Self-pay

## 2019-03-13 ENCOUNTER — Ambulatory Visit (INDEPENDENT_AMBULATORY_CARE_PROVIDER_SITE_OTHER): Payer: Medicare Other | Admitting: Cardiology

## 2019-03-13 DIAGNOSIS — E119 Type 2 diabetes mellitus without complications: Secondary | ICD-10-CM

## 2019-03-13 DIAGNOSIS — I25118 Atherosclerotic heart disease of native coronary artery with other forms of angina pectoris: Secondary | ICD-10-CM | POA: Diagnosis not present

## 2019-03-13 DIAGNOSIS — I10 Essential (primary) hypertension: Secondary | ICD-10-CM | POA: Diagnosis not present

## 2019-03-13 DIAGNOSIS — I77810 Thoracic aortic ectasia: Secondary | ICD-10-CM

## 2019-03-13 DIAGNOSIS — I2510 Atherosclerotic heart disease of native coronary artery without angina pectoris: Secondary | ICD-10-CM | POA: Insufficient documentation

## 2019-03-13 MED ORDER — AMLODIPINE BESYLATE 10 MG PO TABS: 10.0000 mg | ORAL_TABLET | Freq: Every day | ORAL | 2 refills | Status: DC

## 2019-03-13 NOTE — Progress Notes (Signed)
Primary Physician/Referring:  Benito Mccreedy, MD  Patient ID: Corey Beard, male    DOB: 1946-04-10, 73 y.o.   MRN: [id number]  Chief Complaint  Patient presents with  . Coronary Artery Disease  . 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 He also has well controlled diabetes mellitus, hyperlipidemia, hypertension and history of prostate cancer, and tobacco use disorder in the form of smoking cigarettes. He now presents here for 12 month office visit.  He is presently doing well and denies any chest pain or shortness of breath.He has not used any sublingual nitroglycerin in the past 6 months. Except for chronic cough he has no other specific symptoms today.  Past Medical History:  Diagnosis Date  . Arthritis   . Glaucoma, both eyes   . History of MI (myocardial infarction)    2002  . Nocturia   . PAF (paroxysmal atrial fibrillation) ([place])   . Prostate cancer [facility]) urologist-- dr eskridge    T1c, Gleason 3+3, PSA 9.83, vol 61cc  . S/P drug eluting coronary stent placement   . Type 2 diabetes mellitus ([place])   . Wears glasses     Past Surgical History:  Procedure Laterality Date  . CORONARY ANGIOPLASTY  07-01-2001  dr  Terrence Dupont   PCI to  pLAD, mRCA, ostium D1  . CORONARY ANGIOPLASTY  12-04-2001  dr Terrence Dupont   PCI  to  ostial D1,  pLAD  . CORONARY ANGIOPLASTY WITH STENT PLACEMENT  09-03-2002  dr Terrence Dupont   PCI and DES x1 to pLAD  . CYSTOSCOPY WITH BIOPSY  10/04/2015   Procedure: CYSTOSCOPY WITH BIOPSY;  Surgeon: Festus Aloe, MD;  Location: [facility];  Service: Urology;;  . INTRAVASCULAR PRESSURE WIRE/FFR STUDY N/A 07/23/2017   Procedure: INTRAVASCULAR PRESSURE  WIRE/FFR STUDY;  Surgeon: Adrian Prows, MD;  Location: [place] [facility];  Service: Cardiovascular;  Laterality: N/A;  . LEFT HEART CATH AND CORONARY ANGIOGRAPHY N/A 07/23/2017   Procedure: LEFT HEART CATH AND CORONARY ANGIOGRAPHY;  Surgeon: Adrian Prows, MD;  Location: [place] [facility];  Service: Cardiovascular;  Laterality: N/A;  . RADIOACTIVE SEED IMPLANT N/A 10/04/2015   Procedure: RADIOACTIVE SEED IMPLANT/BRACHYTHERAPY IMPLANT;  Surgeon: Festus Aloe, MD;  Location: [facility];  Service: Urology;  Laterality: N/A;  . REPAIR  INCARCERATED EPIGASTRIC HERNIA  06-04-2002    Social History   Socioeconomic History  . Marital status: Married    Spouse name: Not on file  . Number of children: 2  . Years of education: Not on file  . Highest education level: Not on file  Occupational History  . Occupation: Physician  Social Needs  . Financial resource strain: Not on file  . Food insecurity:    Worry: Not on file    Inability: Not on file  . Transportation needs:    Medical: Not on file    Non-medical: Not on file  Tobacco Use  . Smoking status: Current Every Day Smoker    Packs/day: 0.50    Years: 13.00    Pack years: 6.50    Types: Cigarettes  . Smokeless tobacco: Never Used  Substance and Sexual Activity  . Alcohol use: No  Alcohol/week: 0.0 standard drinks  . Drug use: No  . Sexual activity: Not on file  Lifestyle  . Physical activity:    Days per week: Not on file    Minutes per session: Not on file  . Stress: Not on file  Relationships  . Social connections:    Talks on phone: Not on file    Gets together: Not on file    Attends religious service: Not on file    Active member of club or organization: Not on file    Attends meetings of clubs or organizations: Not on file    Relationship status: Not on file  . Intimate partner violence:    Fear of current or ex partner: Not on file    Emotionally abused: Not on file    Physically abused: Not on  file    Forced sexual activity: Not on file  Other Topics Concern  . Not on file  Social History Narrative  . Not on file   Current Outpatient Medications on File Prior to Visit  Medication Sig Dispense Refill  . aspirin EC 325 MG tablet Take 325 mg by mouth daily.    Marland Kitchen atenolol (TENORMIN) 50 MG tablet Take 50 mg by mouth every morning.     Marland Kitchen atorvastatin (LIPITOR) 20 MG tablet Take 20 mg by mouth daily.    . Cholecalciferol (VITAMIN D3) 1000 UNITS CAPS Take 1,000 Units by mouth daily.     . metFORMIN (GLUCOPHAGE-XR) 500 MG 24 hr tablet Take 500 mg by mouth 2 (two) times a day.    . Multiple Vitamins-Minerals (MULTIVITAMIN PO) Take 1 tablet by mouth daily.    . tamsulosin (FLOMAX) 0.4 MG CAPS capsule Take 0.4 mg by mouth daily.    . TRAVATAN Z 0.004 % SOLN ophthalmic solution Place 1 drop into both eyes at bedtime.  0   No current facility-administered medications on file prior to visit.     Review of Systems  Constitution: Negative for chills, decreased appetite, malaise/fatigue and weight gain.  Cardiovascular: Negative for dyspnea on exertion, leg swelling and syncope.  Respiratory: Positive for cough (chronic).   Endocrine: Negative for cold intolerance.  Hematologic/Lymphatic: Does not bruise/bleed easily.  Musculoskeletal: Negative for joint swelling.  Gastrointestinal: Positive for heartburn (helped by Pepcid). Negative for abdominal pain, anorexia and change in bowel habit.  Neurological: Negative for headaches and light-headedness.  Psychiatric/Behavioral: Negative for depression and substance abuse.  All other systems reviewed and are negative.     Objective  Blood pressure (!) 148/70, height 6' (1.829 m), weight 200 lb (90.7 kg). Body mass index is 27.12 kg/m. Physical exam not performed or limited due to virtual visit. Limited examination revealed patient to be in no acute distress, respiration was regular and not labored, no leg edema.  Please see exam details from  prior visit is as below.     Physical Exam  Constitutional: He appears well-developed and well-nourished. No distress.  HENT:  Head: Atraumatic.  Eyes: Conjunctivae are normal.  Neck: Neck supple. No JVD present. No thyromegaly present.  Cardiovascular: Normal rate, regular rhythm, S1 normal, S2 normal and intact distal pulses. Exam reveals no gallop.  Murmur heard. Pulses:      Carotid pulses are on the right side with bruit and on the left side with bruit. II/VI SEM in the right parasternal border  Pulmonary/Chest: Effort normal and breath sounds normal.  Abdominal: Soft. Bowel sounds are normal.  Musculoskeletal: Normal range of motion.  General: No edema.  Neurological: He is alert.  Skin: Skin is warm and dry.  Psychiatric: He has a normal mood and affect.   Radiology: No results found.  Laboratory examination:   Labs 07/31/2018: Total cholesterol 157, triglycerides 53, HDL 73, LDL 73.  BUN 18, creatinine 1.10.  CMP otherwise normal.  A1c 5.7%.  TSH normal previously.  Cardiac Studies:   Coronary Angiogram [07/23/2017]: Coronary angiogram 07/23/2017: Moderate disease in the right coronary artery. LAD diffuse disease, proximal to the stent is a 50% stenosis at most 60%, negative FFR. Occluded sandwiched D1 at the LAD stent and diagonal stent. Collaterals present. Mild disease in circumflex and ramus. Medical therapy. H/O stents 1995 and 2003.  Abdominal aortic duplex 06/27/2017 Normal abdominal aorta. No evidence of abdominal aortic aneurysm. Normal iliac velocity bilateral.  Carotid artery duplex 06/27/2017 No hemodynamically significant arterial disease in the internal carotid artery bilaterally. There is mild to moderate diffuse mixed plaque in the bulb. Antegrade right vertebral artery flow. Antegrade left vertebral artery flow.  Lexiscan myoview stress test 06/03/2017 1. Patient attempted treadmill and requested to stop. The resting electrocardiogram  demonstrated normal sinus rhythm, normal resting conduction, no resting arrhythmias and normal rest repolarization.  Stress EKG is non-diagnostic for ischemia as it a pharmacologic stress using Lexiscan. Stress symptoms included dyspnea and chest pressure. 2. SPECT images demonstrate Medium perfusion abnormality of moderate intensity in the basal inferior, mid inferior and apical inferior myocardial wall(s) on the stress images. The defect is present on the resting images consistent with prior myocardial infarction.   SPECT images demonstrate Medium perfusion abnormality of mild intensity in the mid anterolateral and apical lateral myocardial wall(s) on the stress images.  The defect is not present on the resting images consistent with ischemia.  Gated SPECT imaging demonstrates akinesis of the basal inferior, mid inferior and apical inferior myocardial wall(s).  The left ventricular ejection fraction was calculated or visually estimated to be 37%.  This is a high risk study, consider further cardiac work-up. Compared to Nuclear stress testing 05/06/2002, inferior scar is new.  Echocardiogram 06/27/2017: 1. Left ventricle cavity is normal in size. Moderate concentric remodeling of the left ventricle. Basal anterolateral and Mid anterolateral hypokinesis. Calculated EF 55%. Grade II diastolic dysfunction. Elevated LAP. 2. Left atrial cavity is moderately dilated. 3. Right atrial cavity is mildly dilated. 4. Mild aortic, mitral and tricuspid regurgitation. 5. Pulmonary artery systolic pressure is estimated at 25-30 mm Hg. 6. Mildly dilated ascending aorta measuring at 4.1 cm (1.9 cm/m2)  Assessment   CAD of native heart with stable angina pectoris [facility]): Stent 1995, 2003 LAD: Angio 07/23/17: 50% prox to stent stenosis. Occluded D1 at stent.  Essential hypertension - Plan: amLODipine (NORVASC) 10 MG tablet  Type 2 diabetes mellitus without complication, without long-term current use of insulin (HCC)   Aortic root dilatation (HCC)  EKG 01/30/2018: Normal sinus rhythm at the rate of 60 bpm, left atrial enlargement, normal axis.  LVH with repolarization abnormality, cannot exclude inferior and lateral ischemia.  Frequent PVCs, unifocal.  No significant change from EKG 07/31/2017,  EKG 05/29/2017.  Recommendations:  He is presently doing well and has not had any chest discomfort or chest tightness.  He has had occasional GERD for which he takes Pepcid with relief.  He continues to have chronic cough.  He thinks that it may be related to Benicar.  We'll discontinue this. Try to see if cough improves. Change to Amlodipine 10 mg daily, his blood pressure was  also slightly elevated today.  I reviewed his labs, lipids and diabetes mellitus are under excellent control, renal function is normal, I have discussed with him again regarding smoking cessation.  Advised him to try some milligrams nicotine patch.  I'd like to see him back in 2 months for follow-up in the clinic with EKG and blood pressure follow-up.  Adrian Prows, MD, [facility] 03/13/2019, 10:12 AM [facility]. [place] Pager: [phone] Office: [phone] If no answer Cell [phone]

## 2019-05-13 NOTE — Progress Notes (Deleted)
Primary Physician/Referring:  Benito Mccreedy, MD  Patient ID: Corey Beard, male    DOB: [birth year], 73 y.o.   MRN: [id number]  No chief complaint on file.   HPI: Corey Beard  is a 73 y.o. 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 He also has well controlled diabetes mellitus, hyperlipidemia, hypertension and history of prostate cancer, and tobacco use disorder in the form of smoking cigarettes.   Last seen 2 months ago, due to cough, Benicar was discontinued and he was started on amlodipine. He now presents for follow up.   He is presently doing well and denies any chest pain or shortness of breath.He has not used any sublingual nitroglycerin in the past 6 months. Except for chronic cough he has no other specific symptoms today.  Past Medical History:  Diagnosis Date  . Arthritis   . Glaucoma, both eyes   . History of MI (myocardial infarction)    2002  . Nocturia   . PAF (paroxysmal atrial fibrillation) ([place])   . Prostate cancer [facility]) urologist-- dr eskridge    T1c, Gleason 3+3, PSA 9.83, vol 61cc  . S/P drug eluting coronary stent placement   . Type 2 diabetes mellitus ([place])   . Wears glasses     Past Surgical History:  Procedure Laterality Date  . CORONARY ANGIOPLASTY  07-01-2001  dr  Terrence Dupont   PCI to  pLAD, mRCA, ostium D1  . CORONARY ANGIOPLASTY  12-04-2001  dr Terrence Dupont   PCI  to  ostial D1,  pLAD  . CORONARY ANGIOPLASTY WITH STENT PLACEMENT  09-03-2002  dr Terrence Dupont   PCI and DES x1 to pLAD  . CYSTOSCOPY WITH BIOPSY  10/04/2015   Procedure: CYSTOSCOPY WITH BIOPSY;  Surgeon: Festus Aloe, MD;  Location: [facility];  Service: Urology;;  . INTRAVASCULAR PRESSURE WIRE/FFR STUDY N/A 07/23/2017   Procedure: INTRAVASCULAR PRESSURE WIRE/FFR STUDY;  Surgeon: Adrian Prows, MD;  Location: [place] [facility];  Service: Cardiovascular;  Laterality: N/A;  . LEFT HEART CATH AND CORONARY ANGIOGRAPHY N/A 07/23/2017   Procedure: LEFT HEART CATH AND CORONARY ANGIOGRAPHY;  Surgeon: Adrian Prows, MD;  Location: [place] [facility];  Service: Cardiovascular;  Laterality: N/A;  . RADIOACTIVE SEED IMPLANT N/A 10/04/2015   Procedure: RADIOACTIVE SEED IMPLANT/BRACHYTHERAPY IMPLANT;  Surgeon: Festus Aloe, MD;  Location: [facility];  Service: Urology;  Laterality: N/A;  . REPAIR  INCARCERATED EPIGASTRIC HERNIA  06-04-2002    Social History   Socioeconomic History  . Marital status: Married    Spouse name: Not on file  . Number of children: 2  . Years of education: Not on file  . Highest education level: Not on file  Occupational History  . Occupation: Physician  Social Needs  . Financial resource strain: Not on file  . Food insecurity    Worry: Not on file    Inability: Not on file  . Transportation needs    Medical: Not on file    Non-medical: Not on file  Tobacco Use  . Smoking status: Current Every Day Smoker    Packs/day: 0.50    Years: 13.00    Pack years: 6.50    Types: Cigarettes  . Smokeless tobacco: Never Used  Substance and Sexual Activity  .  Alcohol use: No    Alcohol/week: 0.0 standard drinks  . Drug use: No  . Sexual activity: Not on file  Lifestyle  . Physical activity    Days per week: Not on file    Minutes per session: Not on file  . Stress: Not on file  Relationships  . Social Herbalist on phone: Not on file    Gets together: Not on file    Attends religious service: Not on file    Active member of club or organization: Not on file    Attends meetings of clubs or organizations: Not on file    Relationship status: Not on file  . Intimate partner violence    Fear of current or ex partner: Not on file    Emotionally abused: Not on file     Physically abused: Not on file    Forced sexual activity: Not on file  Other Topics Concern  . Not on file  Social History Narrative  . Not on file   Current Outpatient Medications on File Prior to Visit  Medication Sig Dispense Refill  . amLODipine (NORVASC) 10 MG tablet Take 1 tablet (10 mg total) by mouth daily. 30 tablet 2  . aspirin EC 325 MG tablet Take 325 mg by mouth daily.    Marland Kitchen atenolol (TENORMIN) 50 MG tablet Take 50 mg by mouth every morning.     Marland Kitchen atorvastatin (LIPITOR) 20 MG tablet Take 20 mg by mouth daily.    . Cholecalciferol (VITAMIN D3) 1000 UNITS CAPS Take 1,000 Units by mouth daily.     . metFORMIN (GLUCOPHAGE-XR) 500 MG 24 hr tablet Take 500 mg by mouth 2 (two) times a day.    . Multiple Vitamins-Minerals (MULTIVITAMIN PO) Take 1 tablet by mouth daily.    . tamsulosin (FLOMAX) 0.4 MG CAPS capsule Take 0.4 mg by mouth daily.    . TRAVATAN Z 0.004 % SOLN ophthalmic solution Place 1 drop into both eyes at bedtime.  0   No current facility-administered medications on file prior to visit.     Review of Systems  Constitution: Negative for chills, decreased appetite, malaise/fatigue and weight gain.  Cardiovascular: Negative for dyspnea on exertion, leg swelling and syncope.  Respiratory: Positive for cough (chronic).   Endocrine: Negative for cold intolerance.  Hematologic/Lymphatic: Does not bruise/bleed easily.  Musculoskeletal: Negative for joint swelling.  Gastrointestinal: Positive for heartburn (helped by Pepcid). Negative for abdominal pain, anorexia and change in bowel habit.  Neurological: Negative for headaches and light-headedness.  Psychiatric/Behavioral: Negative for depression and substance abuse.  All other systems reviewed and are negative.     Objective  There were no vitals taken for this visit. There is no height or weight on file to calculate BMI. Physical exam not performed or limited due to virtual visit. Limited examination revealed  patient to be in no acute distress, respiration was regular and not labored, no leg edema.  Please see exam details from prior visit is as below.     Physical Exam  Constitutional: He appears well-developed and well-nourished. No distress.  HENT:  Head: Atraumatic.  Eyes: Conjunctivae are normal.  Neck: Neck supple. No JVD present. No thyromegaly present.  Cardiovascular: Normal rate, regular rhythm, S1 normal, S2 normal and intact distal pulses. Exam reveals no gallop.  Murmur heard. Pulses:      Carotid pulses are on the right side with bruit and on the left side with bruit. II/VI SEM in the right  parasternal border  Pulmonary/Chest: Effort normal and breath sounds normal.  Abdominal: Soft. Bowel sounds are normal.  Musculoskeletal: Normal range of motion.        General: No edema.  Neurological: He is alert.  Skin: Skin is warm and dry.  Psychiatric: He has a normal mood and affect.   Radiology: No results found.  Laboratory examination:   Labs 07/31/2018: Total cholesterol 157, triglycerides 53, HDL 73, LDL 73.  BUN 18, creatinine 1.10.  CMP otherwise normal.  A1c 5.7%.  TSH normal previously.  Cardiac Studies:   Coronary Angiogram [07/23/2017]: Coronary angiogram 07/23/2017: Moderate disease in the right coronary artery. LAD diffuse disease, proximal to the stent is a 50% stenosis at most 60%, negative FFR. Occluded sandwiched D1 at the LAD stent and diagonal stent. Collaterals present. Mild disease in circumflex and ramus. Medical therapy. H/O stents 1995 and 2003.  Abdominal aortic duplex 06/27/2017 Normal abdominal aorta. No evidence of abdominal aortic aneurysm. Normal iliac velocity bilateral.  Carotid artery duplex 06/27/2017 No hemodynamically significant arterial disease in the internal carotid artery bilaterally. There is mild to moderate diffuse mixed plaque in the bulb. Antegrade right vertebral artery flow. Antegrade left vertebral artery flow.  Lexiscan  myoview stress test 06/03/2017 1. Patient attempted treadmill and requested to stop. The resting electrocardiogram demonstrated normal sinus rhythm, normal resting conduction, no resting arrhythmias and normal rest repolarization.  Stress EKG is non-diagnostic for ischemia as it a pharmacologic stress using Lexiscan. Stress symptoms included dyspnea and chest pressure. 2. SPECT images demonstrate Medium perfusion abnormality of moderate intensity in the basal inferior, mid inferior and apical inferior myocardial wall(s) on the stress images. The defect is present on the resting images consistent with prior myocardial infarction.   SPECT images demonstrate Medium perfusion abnormality of mild intensity in the mid anterolateral and apical lateral myocardial wall(s) on the stress images.  The defect is not present on the resting images consistent with ischemia.  Gated SPECT imaging demonstrates akinesis of the basal inferior, mid inferior and apical inferior myocardial wall(s).  The left ventricular ejection fraction was calculated or visually estimated to be 37%.  This is a high risk study, consider further cardiac work-up. Compared to Nuclear stress testing 05/06/2002, inferior scar is new.  Echocardiogram 06/27/2017: 1. Left ventricle cavity is normal in size. Moderate concentric remodeling of the left ventricle. Basal anterolateral and Mid anterolateral hypokinesis. Calculated EF 55%. Grade II diastolic dysfunction. Elevated LAP. 2. Left atrial cavity is moderately dilated. 3. Right atrial cavity is mildly dilated. 4. Mild aortic, mitral and tricuspid regurgitation. 5. Pulmonary artery systolic pressure is estimated at 25-30 mm Hg. 6. Mildly dilated ascending aorta measuring at 4.1 cm (1.9 cm/m2)  Assessment   No diagnosis found.  EKG 01/30/2018: Normal sinus rhythm at the rate of 60 bpm, left atrial enlargement, normal axis.  LVH with repolarization abnormality, cannot exclude inferior and lateral  ischemia.  Frequent PVCs, unifocal.  No significant change from EKG 07/31/2017,  EKG 05/29/2017.  Recommendations:  He is presently doing well and has not had any chest discomfort or chest tightness.  He has had occasional GERD for which he takes Pepcid with relief.  He continues to have chronic cough.  He thinks that it may be related to Benicar.  We'll discontinue this. Try to see if cough improves. Change to Amlodipine 10 mg daily, his blood pressure was also slightly elevated today.  I reviewed his labs, lipids and diabetes mellitus are under excellent control, renal function is  normal, I have discussed with him again regarding smoking cessation.  Advised him to try some milligrams nicotine patch.  I'd like to see him back in 2 months for follow-up in the clinic with EKG and blood pressure follow-up.  Miquel Dunn, MD, [facility] 05/13/2019, 12:44 PM [facility]. [place] Pager: [phone] Office: [phone] If no answer Cell [phone]

## 2019-05-14 ENCOUNTER — Ambulatory Visit: Payer: Medicare Other | Admitting: Cardiology

## 2019-05-21 ENCOUNTER — Encounter: Payer: Self-pay | Admitting: Cardiology

## 2019-05-21 ENCOUNTER — Ambulatory Visit (INDEPENDENT_AMBULATORY_CARE_PROVIDER_SITE_OTHER): Payer: Medicare Other | Admitting: Cardiology

## 2019-05-21 ENCOUNTER — Other Ambulatory Visit: Payer: Self-pay

## 2019-05-21 VITALS — BP 150/64 | HR 63 | Temp 98.4°F | Ht 72.0 in | Wt 201.0 lb

## 2019-05-21 DIAGNOSIS — F17200 Nicotine dependence, unspecified, uncomplicated: Secondary | ICD-10-CM

## 2019-05-21 DIAGNOSIS — I25118 Atherosclerotic heart disease of native coronary artery with other forms of angina pectoris: Secondary | ICD-10-CM

## 2019-05-21 DIAGNOSIS — R0989 Other specified symptoms and signs involving the circulatory and respiratory systems: Secondary | ICD-10-CM | POA: Diagnosis not present

## 2019-05-21 DIAGNOSIS — I77810 Thoracic aortic ectasia: Secondary | ICD-10-CM | POA: Diagnosis not present

## 2019-05-21 DIAGNOSIS — I10 Essential (primary) hypertension: Secondary | ICD-10-CM

## 2019-05-21 MED ORDER — SPIRONOLACTONE 25 MG PO TABS: 25.0000 mg | ORAL_TABLET | ORAL | 2 refills | Status: DC

## 2019-05-21 NOTE — Progress Notes (Signed)
Primary Physician/Referring:  Corey Mccreedy, MD  Patient ID: Corey Beard, male    DOB: 19-Oct-1946, 73 y.o.   MRN: [id number]  Chief Complaint  Patient presents with  . 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   He also has well controlled diabetes mellitus, hyperlipidemia, hypertension and history of prostate cancer, and tobacco use disorder in the form of smoking cigarettes. He now presents here for 2 month office visit. He had a virtual visit 2 months ago  And noted to have elevated BP and also because of aortic root dilatation brought in for evaluation.   He is back on ARB (Exforge) that we had discontinued due to cough, states he has had recurrence of cough, mild.   He is presently doing well and denies any chest pain or shortness of breath.He has not used any sublingual nitroglycerin in the past 6 months.   Past Medical History:  Diagnosis Date  . Arthritis   . Glaucoma, both eyes   . History of MI (myocardial infarction)    2002  . Nocturia   . PAF (paroxysmal atrial fibrillation) ([place])   . Prostate cancer [facility]) urologist-- dr eskridge    T1c, Gleason 3+3, PSA 9.83, vol 61cc  . S/P drug eluting coronary stent placement   . Type 2 diabetes mellitus ([place])   . Wears glasses     Past Surgical History:  Procedure Laterality Date  . CORONARY ANGIOPLASTY  07-01-2001  dr  Terrence Dupont   PCI to  pLAD, mRCA, ostium D1  . CORONARY ANGIOPLASTY  12-04-2001  dr Terrence Dupont   PCI  to  ostial D1,  pLAD  . CORONARY ANGIOPLASTY WITH STENT PLACEMENT  09-03-2002  dr Terrence Dupont   PCI and DES x1 to pLAD  . CYSTOSCOPY WITH BIOPSY  10/04/2015   Procedure: CYSTOSCOPY  WITH BIOPSY;  Surgeon: Festus Aloe, MD;  Location: [facility];  Service: Urology;;  . INTRAVASCULAR PRESSURE WIRE/FFR STUDY N/A 07/23/2017   Procedure: INTRAVASCULAR PRESSURE WIRE/FFR STUDY;  Surgeon: Adrian Prows, MD;  Location: [place] [facility];  Service: Cardiovascular;  Laterality: N/A;  . LEFT HEART CATH AND CORONARY ANGIOGRAPHY N/A 07/23/2017   Procedure: LEFT HEART CATH AND CORONARY ANGIOGRAPHY;  Surgeon: Adrian Prows, MD;  Location: [place] [facility];  Service: Cardiovascular;  Laterality: N/A;  . RADIOACTIVE SEED IMPLANT N/A 10/04/2015   Procedure: RADIOACTIVE SEED IMPLANT/BRACHYTHERAPY IMPLANT;  Surgeon: Festus Aloe, MD;  Location: [facility];  Service: Urology;  Laterality: N/A;  . REPAIR  INCARCERATED EPIGASTRIC HERNIA  06-04-2002    Social History   Socioeconomic History  . Marital status: Married    Spouse name: Not on file  . Number of children: 2  . Years of education: Not on file  . Highest education level: Not on file  Occupational History  . Occupation: Physician  Social Needs  . Financial resource strain: Not on file  . Food insecurity    Worry: Not on file    Inability: Not on file  . Transportation needs    Medical: Not on file    Non-medical: Not on file  Tobacco Use  .  Smoking status: Current Every Day Smoker    Packs/day: 0.50    Years: 13.00    Pack years: 6.50    Types: Cigarettes  . Smokeless tobacco: Never Used  Substance and Sexual Activity  . Alcohol use: No    Alcohol/week: 0.0 standard drinks  . Drug use: No  . Sexual activity: Not on file  Lifestyle  . Physical activity    Days per week: Not on file    Minutes per session: Not on file  . Stress: Not on file  Relationships  . Social Herbalist on phone: Not on file    Gets together: Not on file    Attends religious service: Not on file    Active member of club or organization: Not on file    Attends meetings of clubs or organizations:  Not on file    Relationship status: Not on file  . Intimate partner violence    Fear of current or ex partner: Not on file    Emotionally abused: Not on file    Physically abused: Not on file    Forced sexual activity: Not on file  Other Topics Concern  . Not on file  Social History Narrative  . Not on file   Current Outpatient Medications on File Prior to Visit  Medication Sig Dispense Refill  . amlodipine-olmesartan (AZOR) 10-20 MG tablet Take 1 tablet by mouth daily.    Marland Kitchen aspirin EC 325 MG tablet Take 325 mg by mouth daily.    Marland Kitchen atenolol (TENORMIN) 50 MG tablet Take 50 mg by mouth every morning.     Marland Kitchen atorvastatin (LIPITOR) 20 MG tablet Take 20 mg by mouth daily.    . Cholecalciferol (VITAMIN D3) 1000 UNITS CAPS Take 1,000 Units by mouth daily.     . metFORMIN (GLUCOPHAGE-XR) 500 MG 24 hr tablet Take 500 mg by mouth 2 (two) times a day.    . Multiple Vitamins-Minerals (MULTIVITAMIN PO) Take 1 tablet by mouth daily.    . tamsulosin (FLOMAX) 0.4 MG CAPS capsule Take 0.4 mg by mouth daily.    . TRAVATAN Z 0.004 % SOLN ophthalmic solution Place 1 drop into both eyes at bedtime.  0   No current facility-administered medications on file prior to visit.     Review of Systems  Constitution: Negative for chills, decreased appetite, malaise/fatigue and weight gain.  Cardiovascular: Negative for dyspnea on exertion, leg swelling and syncope.  Respiratory: Positive for cough (chronic).   Endocrine: Negative for cold intolerance.  Hematologic/Lymphatic: Does not bruise/bleed easily.  Musculoskeletal: Positive for gout. Negative for joint swelling.  Gastrointestinal: Positive for heartburn (helped by Pepcid). Negative for abdominal pain, anorexia and change in bowel habit.  Genitourinary: Positive for bladder incontinence and hesitancy.  Neurological: Negative for headaches and light-headedness.  Psychiatric/Behavioral: Negative for depression and substance abuse.  All other systems  reviewed and are negative.     Objective  Blood pressure (!) 150/64, pulse 63, temperature 98.4 F (36.9 C), height 6' (1.829 m), weight 201 lb (91.2 kg), SpO2 97 %. Body mass index is 27.26 kg/m.   Physical Exam  Constitutional: He appears well-developed and well-nourished. No distress.  HENT:  Head: Atraumatic.  Eyes: Conjunctivae are normal.  Neck: Neck supple. No JVD present. No thyromegaly present.  Cardiovascular: Normal rate, regular rhythm, S1 normal, S2 normal and intact distal pulses. Exam reveals no gallop.  Murmur heard. Pulses:      Carotid pulses are on the right  side with bruit and on the left side with bruit. II/Beard SEM in the right parasternal border  Pulmonary/Chest: Effort normal and breath sounds normal.  Abdominal: Soft. Bowel sounds are normal.  Musculoskeletal: Normal range of motion.        General: No edema.  Neurological: He is alert.  Skin: Skin is warm and dry.  Psychiatric: He has a normal mood and affect.   Radiology: No results found.  Laboratory examination:   Labs 05/05/2019: Total cholesterol 183, triglycerides 59, HDL 81, LDL 90.  Serum glucose 97 mg, BUN 22, creatinine 1.04, eGFR greater than 60 mL, potassium 4.2.  CBC normal.  A1c 6.1%.  TSH normal.  Labs 07/31/2018: Total cholesterol 157, triglycerides 53, HDL 73, LDL 73.  BUN 18, creatinine 1.10.  CMP otherwise normal.  A1c 5.7%.  TSH normal previously.  Cardiac Studies:   Coronary Angiogram [07/23/2017]: Coronary angiogram 07/23/2017: Moderate disease in the right coronary artery. LAD diffuse disease, proximal to the stent is a 50% stenosis at most 60%, negative FFR. Occluded sandwiched D1 at the LAD stent and diagonal stent. Collaterals present. Mild disease in circumflex and ramus. Medical therapy. H/O stents 1995 and 2003.  Abdominal aortic duplex 06/27/2017 Normal abdominal aorta. No evidence of abdominal aortic aneurysm. Normal iliac velocity bilateral.  Carotid artery duplex  06/27/2017 No hemodynamically significant arterial disease in the internal carotid artery bilaterally. There is mild to moderate diffuse mixed plaque in the bulb. Antegrade right vertebral artery flow. Antegrade left vertebral artery flow.  Lexiscan myoview stress test 06/03/2017 1. Patient attempted treadmill and requested to stop. The resting electrocardiogram demonstrated normal sinus rhythm, normal resting conduction, no resting arrhythmias and normal rest repolarization.  Stress EKG is non-diagnostic for ischemia as it a pharmacologic stress using Lexiscan. Stress symptoms included dyspnea and chest pressure. 2. SPECT images demonstrate Medium perfusion abnormality of moderate intensity in the basal inferior, mid inferior and apical inferior myocardial wall(s) on the stress images. The defect is present on the resting images consistent with prior myocardial infarction.   SPECT images demonstrate Medium perfusion abnormality of mild intensity in the mid anterolateral and apical lateral myocardial wall(s) on the stress images.  The defect is not present on the resting images consistent with ischemia.  Gated SPECT imaging demonstrates akinesis of the basal inferior, mid inferior and apical inferior myocardial wall(s).  The left ventricular ejection fraction was calculated or visually estimated to be 37%.  This is a high risk study, consider further cardiac work-up. Compared to Nuclear stress testing 05/06/2002, inferior scar is new.  Echocardiogram 06/27/2017: 1. Left ventricle cavity is normal in size. Moderate concentric remodeling of the left ventricle. Basal anterolateral and Mid anterolateral hypokinesis. Calculated EF 55%. Grade II diastolic dysfunction. Elevated LAP. 2. Left atrial cavity is moderately dilated. 3. Right atrial cavity is mildly dilated. 4. Mild aortic, mitral and tricuspid regurgitation. 5. Pulmonary artery systolic pressure is estimated at 25-30 mm Hg. 6. Mildly dilated  ascending aorta measuring at 4.1 cm (1.9 cm/m2)  Assessment   1. CAD of native heart with stable angina pectoris [facility]): Stent 1995, 2003 LAD: Angio 07/23/17: 50% prox to stent stenosis. Occluded D1 at stent.  - PCV ECHOCARDIOGRAM COMPLETE; Future - EKG 12-Lead  2. Essential hypertension  - spironolactone (ALDACTONE) 25 MG tablet; Take 1 tablet (25 mg total) by mouth every morning.  Dispense: 30 tablet; Refill: 2 - Basic metabolic panel  3. Aortic root dilatation (HCC)  - PCV ECHOCARDIOGRAM COMPLETE; Future  4. Bilateral carotid  bruits  - PCV CAROTID DUPLEX (BILATERAL); Future  5. Tobacco use disorder  EKG 05/21/2019: Normal sinus rhythm at rate of 68 bpm, left atrial abnormality, normal axis.  2 PVCs were noted.  Otherwise no evidence of ischemia, normal QT interval. No significant change from  EKG 01/30/2018: Frequent PVCs, unifocal.    Recommendations:   I had discontinued Exforge due to cough with Benicar complement but was restarted for renal protection.  He has now had recurrence of cough but mild.  Blood pressure is uncontrolled, would like to try spironolactone 25 mg in the morning.  He still continues to smoke smoking cessation was discussed at length.  No recurrence of angina pectoris.  Otherwise continue present medical therapy.  Very loud carotid artery bruit, it is been greater than 2 years since last carotid duplex will repeat one.  Aortic root dilatation was noted 2 years ago, hypertension and tobacco use disorder contributing to risk of aortic aneurysm, abdominal aortic duplex negative for AAA, will repeat echocardiogram to evaluate his aortic root size.  Again strongly discussed smoking cessation.  Office visit in 2 months for follow-up.  This is a 25 minute visit with greater than 50% time spent face-to-face with additional 5 minutes on smoking cessation.  Adrian Prows, MD, [facility] 05/21/2019, 10:10 PM [place] [facility]. [place] Pager: [phone] Office:  [phone] If no answer Cell [phone]

## 2019-06-25 ENCOUNTER — Other Ambulatory Visit: Payer: Medicare Other

## 2019-07-08 ENCOUNTER — Ambulatory Visit (INDEPENDENT_AMBULATORY_CARE_PROVIDER_SITE_OTHER): Payer: Medicare Other

## 2019-07-08 ENCOUNTER — Other Ambulatory Visit: Payer: Self-pay

## 2019-07-08 DIAGNOSIS — I25118 Atherosclerotic heart disease of native coronary artery with other forms of angina pectoris: Secondary | ICD-10-CM

## 2019-07-08 DIAGNOSIS — I77810 Thoracic aortic ectasia: Secondary | ICD-10-CM

## 2019-07-08 DIAGNOSIS — R0989 Other specified symptoms and signs involving the circulatory and respiratory systems: Secondary | ICD-10-CM | POA: Diagnosis not present

## 2019-07-09 ENCOUNTER — Ambulatory Visit: Payer: Medicare Other | Admitting: Cardiology

## 2019-07-11 ENCOUNTER — Other Ambulatory Visit: Payer: Self-pay | Admitting: Cardiology

## 2019-07-11 DIAGNOSIS — I6523 Occlusion and stenosis of bilateral carotid arteries: Secondary | ICD-10-CM

## 2019-07-11 LAB — BASIC METABOLIC PANEL
BUN/Creatinine Ratio: 15 (ref 10–24)
BUN: 18 mg/dL (ref 8–27)
CO2: 21 mmol/L (ref 20–29)
Calcium: 10.1 mg/dL (ref 8.6–10.2)
Chloride: 100 mmol/L (ref 96–106)
Creatinine, Ser: 1.24 mg/dL (ref 0.76–1.27)
GFR calc Af Amer: 67 mL/min/{1.73_m2} (ref >59)
GFR calc non Af Amer: 58 mL/min/{1.73_m2} — ABNORMAL LOW (ref >59)
Glucose: 83 mg/dL (ref 65–99)
Potassium: 5.4 mmol/L — ABNORMAL HIGH (ref 3.5–5.2)
Sodium: 139 mmol/L (ref 134–144)

## 2019-07-13 NOTE — Progress Notes (Signed)
Kidney function stable, potassium slightly elevated. Aldactone dosage was decreased.

## 2019-07-16 ENCOUNTER — Ambulatory Visit: Payer: Medicare Other | Admitting: Cardiology

## 2019-07-16 ENCOUNTER — Encounter: Payer: Self-pay | Admitting: Cardiology

## 2019-07-16 ENCOUNTER — Other Ambulatory Visit: Payer: Self-pay

## 2019-07-16 VITALS — BP 116/55 | HR 65 | Ht 72.0 in | Wt 204.0 lb

## 2019-07-16 DIAGNOSIS — I10 Essential (primary) hypertension: Secondary | ICD-10-CM | POA: Diagnosis not present

## 2019-07-16 DIAGNOSIS — I6523 Occlusion and stenosis of bilateral carotid arteries: Secondary | ICD-10-CM | POA: Diagnosis not present

## 2019-07-16 DIAGNOSIS — F17200 Nicotine dependence, unspecified, uncomplicated: Secondary | ICD-10-CM | POA: Diagnosis not present

## 2019-07-16 DIAGNOSIS — I77810 Thoracic aortic ectasia: Secondary | ICD-10-CM

## 2019-07-16 MED ORDER — SPIRONOLACTONE 25 MG PO TABS: 25.0000 mg | ORAL_TABLET | ORAL | 1 refills | Status: DC

## 2019-07-16 NOTE — Progress Notes (Signed)
Primary Physician/Referring:  Benito Mccreedy, MD  Patient ID: Corey Beard, male    DOB: [birth year], 73 y.o.   MRN: [id number]  Chief Complaint  Patient presents with  . Coronary Artery Disease    RESULTS  . Hypertension    HPI: Corey Beard  is a 73 y.o. 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   He also has well controlled diabetes mellitus, hyperlipidemia, hypertension and history of prostate cancer, and tobacco use disorder in the form of smoking cigarettes. He now presents here for 2 month office visit. He had a virtual visit 2 months ago  And noted to have elevated BP and also because of aortic root dilatation brought in for evaluation.  I had added spironolactone 25 mg daily, but in view of hyperkalemia reduce the dose to 12.5 mg daily.  He now presents for follow-up.  States that he is doing well.  He also underwent carotid artery duplex for carotid bruit.  Past Medical History:  Diagnosis Date  . Arthritis   . Glaucoma, both eyes   . History of MI (myocardial infarction)    2002  . Nocturia   . PAF (paroxysmal atrial fibrillation) ([place])   . Prostate cancer [facility] urologist-- dr eskridge    T1c, Gleason 3+3, PSA 9.83, vol 61cc  . S/P drug eluting coronary stent placement   . Type 2 diabetes mellitus ([place])   . Wears glasses     Past Surgical History:  Procedure Laterality Date  . CORONARY ANGIOPLASTY  07-01-2001  dr  Terrence Dupont   PCI to  pLAD, mRCA, ostium D1  . CORONARY ANGIOPLASTY  12-04-2001  dr Terrence Dupont   PCI  to  ostial D1,  pLAD  . CORONARY ANGIOPLASTY WITH STENT PLACEMENT  09-03-2002  dr Terrence Dupont   PCI and DES x1 to pLAD  . CYSTOSCOPY WITH BIOPSY  10/04/2015   Procedure: CYSTOSCOPY WITH BIOPSY;  Surgeon:  Festus Aloe, MD;  Location: [facility];  Service: Urology;;  . INTRAVASCULAR PRESSURE WIRE/FFR STUDY N/A 07/23/2017   Procedure: INTRAVASCULAR PRESSURE WIRE/FFR STUDY;  Surgeon: Adrian Prows, MD;  Location: [facility];  Service: Cardiovascular;  Laterality: N/A;  . LEFT HEART CATH AND CORONARY ANGIOGRAPHY N/A 07/23/2017   Procedure: LEFT HEART CATH AND CORONARY ANGIOGRAPHY;  Surgeon: Adrian Prows, MD;  Location: [facility];  Service: Cardiovascular;  Laterality: N/A;  . RADIOACTIVE SEED IMPLANT N/A 10/04/2015   Procedure: RADIOACTIVE SEED IMPLANT/BRACHYTHERAPY IMPLANT;  Surgeon: Festus Aloe, MD;  Location: [facility];  Service: Urology;  Laterality: N/A;  . REPAIR  INCARCERATED EPIGASTRIC HERNIA  06-04-2002    Social History   Socioeconomic History  . Marital status: Married    Spouse name: Not on file  . Number of children: 2  . Years of education: Not on file  . Highest education level: Not on file  Occupational History  . Occupation: Physician  Social Needs  . Financial resource strain: Not on file  . Food insecurity    Worry: Not on file    Inability: Not on file  . Transportation needs    Medical: Not on file    Non-medical: Not on file  Tobacco Use  . Smoking status: Current Every Day Smoker  Packs/day: 0.50    Years: 13.00    Pack years: 6.50    Types: Cigarettes  . Smokeless tobacco: Never Used  Substance and Sexual Activity  . Alcohol use: No    Alcohol/week: 0.0 standard drinks  . Drug use: No  . Sexual activity: Not on file  Lifestyle  . Physical activity    Days per week: Not on file    Minutes per session: Not on file  . Stress: Not on file  Relationships  . Social Herbalist on phone: Not on file    Gets together: Not on file    Attends religious service: Not on file    Active member of club or organization: Not on file    Attends meetings of clubs or organizations: Not on file     Relationship status: Not on file  . Intimate partner violence    Fear of current or ex partner: Not on file    Emotionally abused: Not on file    Physically abused: Not on file    Forced sexual activity: Not on file  Other Topics Concern  . Not on file  Social History Narrative  . Not on file   Current Outpatient Medications on File Prior to Visit  Medication Sig Dispense Refill  . amlodipine-olmesartan (AZOR) 10-20 MG tablet Take 1 tablet by mouth daily.    Marland Kitchen aspirin EC 325 MG tablet Take 325 mg by mouth daily.    Marland Kitchen atenolol (TENORMIN) 50 MG tablet Take 50 mg by mouth every morning.     Marland Kitchen atorvastatin (LIPITOR) 20 MG tablet Take 20 mg by mouth daily.    . Cholecalciferol (VITAMIN D3) 1000 UNITS CAPS Take 1,000 Units by mouth daily.     . L-ARGININE-500 PO Take 1,000 mg by mouth daily.    . metFORMIN (GLUCOPHAGE-XR) 500 MG 24 hr tablet Take 500 mg by mouth 2 (two) times a day.    . Multiple Vitamins-Minerals (MULTIVITAMIN PO) Take 1 tablet by mouth daily.    Marland Kitchen spironolactone (ALDACTONE) 25 MG tablet Take 1 tablet (25 mg total) by mouth every morning. (Patient taking differently: Take 12.5 mg by mouth every morning. ) 30 tablet 2  . tamsulosin (FLOMAX) 0.4 MG CAPS capsule Take 0.4 mg by mouth daily.    . TRAVATAN Z 0.004 % SOLN ophthalmic solution Place 1 drop into both eyes at bedtime.  0   No current facility-administered medications on file prior to visit.     Review of Systems  Constitution: Negative for chills, decreased appetite, malaise/fatigue and weight gain.  Cardiovascular: Negative for dyspnea on exertion, leg swelling and syncope.  Respiratory: Positive for cough (chronic).   Endocrine: Negative for cold intolerance.  Hematologic/Lymphatic: Does not bruise/bleed easily.  Musculoskeletal: Positive for gout. Negative for joint swelling.  Gastrointestinal: Positive for heartburn (helped by Pepcid). Negative for abdominal pain, anorexia and change in bowel habit.   Genitourinary: Positive for bladder incontinence and hesitancy.  Neurological: Negative for headaches and light-headedness.  Psychiatric/Behavioral: Negative for depression and substance abuse.  All other systems reviewed and are negative.  Objective  Blood pressure (!) 116/55, pulse 65, height 6' (1.829 m), weight 204 lb (92.5 kg), SpO2 96 %. Body mass index is 27.67 kg/m.   Physical Exam  Constitutional: He appears well-developed and well-nourished. No distress.  HENT:  Head: Atraumatic.  Eyes: Conjunctivae are normal.  Neck: Neck supple. No JVD present. No thyromegaly present.  Cardiovascular: Normal rate, regular rhythm, S1  normal, S2 normal and intact distal pulses. Exam reveals no gallop.  Murmur heard. Pulses:      Carotid pulses are on the right side with bruit and on the left side with bruit. II/VI SEM in the right parasternal border  Pulmonary/Chest: Effort normal and breath sounds normal.  Abdominal: Soft. Bowel sounds are normal.  Musculoskeletal: Normal range of motion.        General: No edema.  Neurological: He is alert.  Skin: Skin is warm and dry.  Psychiatric: He has a normal mood and affect.   Radiology: No results found.  Laboratory examination:   CMP Latest Ref Rng & Units 07/10/2019 09/27/2015 07/16/2011  Glucose 65 - 99 mg/dL 83 85 78  BUN 8 - 27 mg/dL _0 Creatinine 0.76 - 1.27 mg/dL 1.24 0.86 1.28  Sodium 134 - 144 mmol/L 139 139 138  Potassium 3.5 - 5.2 mmol/L 5.4(H) 4.1 3.8  Chloride 96 - 106 mmol/L 100 103 100  CO2 20 - 29 mmol/L _1 Calcium 8.6 - 10.2 mg/dL 10.1 9.8 9.0  Total Protein 6.5 - 8.1 g/dL - 7.7 -  Total Bilirubin 0.3 - 1.2 mg/dL - 0.6 -  Alkaline Phos 38 - 126 U/L - 83 -  AST 15 - 41 U/L - 17 -  ALT 17 - 63 U/L - 17 -   CBC Latest Ref Rng & Units 09/27/2015 07/16/2011  WBC 4.0 - 10.5 K/uL 10.6(H) 9.9  Hemoglobin 13.0 - 17.0 g/dL 13.9 12.9(L)  Hematocrit 39.0 - 52.0 % 41.1 37.0(L)  Platelets 150 - 400 K/uL 349 332    Lipid Panel  No results found for: CHOL, TRIG, HDL, CHOLHDL, VLDL, LDLCALC, LDLDIRECT HEMOGLOBIN A1C No results found for: HGBA1C, MPG TSH No results for input(s): TSH in the last 8760 hours.   Labs 05/05/2019: Total cholesterol 183, triglycerides 59, HDL 81, LDL 90.  Serum glucose 97 mg, BUN 22, creatinine 1.04, eGFR greater than 60 mL, potassium 4.2.  CBC normal.  A1c 6.1%.  TSH normal.  Labs 07/31/2018: Total cholesterol 157, triglycerides 53, HDL 73, LDL 73.  BUN 18, creatinine 1.10.  CMP otherwise normal.  A1c 5.7%.  TSH normal previously.  Cardiac Studies:   Coronary Angiogram [07/23/2017]: Coronary angiogram 07/23/2017: Moderate disease in the right coronary artery. LAD diffuse disease, proximal to the stent is a 50% stenosis at most 60%, negative FFR. Occluded sandwiched D1 at the LAD stent and diagonal stent. Collaterals present. Mild disease in circumflex and ramus. Medical therapy. H/O stents 1995 and 2003.  Abdominal aortic duplex 06/27/2017 Normal abdominal aorta. No evidence of abdominal aortic aneurysm. Normal iliac velocity bilateral.  Lexiscan myoview stress test 06/03/2017 1. Patient attempted treadmill and requested to stop. The resting electrocardiogram demonstrated normal sinus rhythm, normal resting conduction, no resting arrhythmias and normal rest repolarization.  Stress EKG is non-diagnostic for ischemia as it a pharmacologic stress using Lexiscan. Stress symptoms included dyspnea and chest pressure. 2. SPECT images demonstrate Medium perfusion abnormality of moderate intensity in the basal inferior, mid inferior and apical inferior myocardial wall(s) on the stress images. The defect is present on the resting images consistent with prior myocardial infarction.   SPECT images demonstrate Medium perfusion abnormality of mild intensity in the mid anterolateral and apical lateral myocardial wall(s) on the stress images.  The defect is not present on the resting images  consistent with ischemia.  Gated SPECT imaging demonstrates akinesis of the basal inferior, mid inferior and apical inferior myocardial  wall(s).  The left ventricular ejection fraction was calculated or visually estimated to be 37%.  This is a high risk study, consider further cardiac work-up. Compared to Nuclear stress testing 05/06/2002, inferior scar is new.  Echocardiogram 07/08/2019: Left ventricle cavity is normal in size. Moderate concentric hypertrophy of the left ventricle. Normal LV systolic function with EF 67%. Normal global wall motion. Doppler evidence of grade I (impaired) diastolic dysfunction, normal LAP.  Left atrial cavity is mildly dilated. Trileaflet aortic valve. Mild aortic valve leaflet calcification. Mild aortic valve stenosis. Aortic valve mean gradient of 9 mmHg, Vmax of 2.0 m/s. Calculated aortic valve area by continuity equation is 2.1 cm. Mild to moderate aortic regurgitation. Mild (Grade I) mitral regurgitation. The aortic root is mildly dilated at 4.0 cm. Estimated RA pressure 8 mmHg. Compared to previous study on 06/28/2015, aortic root is stable.   Carotid artery duplex  07/08/2019: Stenosis in the right internal carotid artery (16-49%). Stenosis in the right external carotid artery (<50%). Mild stenosis in the left carotid bulb and common carotid artery (<50%). Stenosis in the left external carotid artery (<50%). Antegrade right vertebral artery flow. Antegrade left vertebral artery flow. Compared to the study done on 06/27/2017, previously there was mild to moderate diffuse plaque without hemodynamically significant stenosis. Follow up in one year is appropriate if clinically indicated.  Assessment     ICD-10-CM   1. Essential hypertension  I10 spironolactone (ALDACTONE) 25 MG tablet  2. Asymptomatic bilateral carotid artery stenosis  I65.23   3. Aortic root dilatation (HCC)  I77.810   4. Tobacco use disorder  F17.200    EKG 05/21/2019: Normal sinus rhythm  at rate of 68 bpm, left atrial abnormality, normal axis.  2 PVCs were noted.  Otherwise no evidence of ischemia, normal QT interval. No significant change from  EKG 01/30/2018: Frequent PVCs, unifocal.    Recommendations:   Patient is here on a 6 week office visit and follow-up of hypertension, aortic root dilatation noted on echocardiogram, carotid stenosis.  I reviewed the results of the echocardiogram and the carotid artery duplex with the patient.  Aortic root size is remained stable.  LV function is normal.  We probably will need to repeat his echocardiogram in 2 years to follow-up on the aortic root dilatation.  I also discussed the carotid duplex, moderate amount of heterogeneous plaque noted, advised him that smoking will certainly contribute to worsening and progression of carotid disease.  With regard to hypertension, blood pressure is now well controlled on spironolactone 12.5 mg daily, he would like to take it as 25 mg every other day.  He could not tolerate 25 mg due to hyperkalemia I had reduce the dose to 12.5 mg.  Difficult to break the tablets in half.  I'll see him back in one year or sooner if problems.  Adrian Prows, MD, [facility] 07/16/2019, 2:29 PM [facility]. [place] Pager: [phone] Office: [phone] If no answer Cell [phone]

## 2019-11-26 DIAGNOSIS — H35033 Hypertensive retinopathy, bilateral: Secondary | ICD-10-CM | POA: Diagnosis not present

## 2019-11-26 DIAGNOSIS — H25813 Combined forms of age-related cataract, bilateral: Secondary | ICD-10-CM | POA: Diagnosis not present

## 2019-11-26 DIAGNOSIS — H401132 Primary open-angle glaucoma, bilateral, moderate stage: Secondary | ICD-10-CM | POA: Diagnosis not present

## 2020-03-24 DIAGNOSIS — I2510 Atherosclerotic heart disease of native coronary artery without angina pectoris: Secondary | ICD-10-CM | POA: Diagnosis not present

## 2020-03-24 DIAGNOSIS — Z1321 Encounter for screening for nutritional disorder: Secondary | ICD-10-CM | POA: Diagnosis not present

## 2020-03-24 DIAGNOSIS — Z125 Encounter for screening for malignant neoplasm of prostate: Secondary | ICD-10-CM | POA: Diagnosis not present

## 2020-03-24 DIAGNOSIS — N529 Male erectile dysfunction, unspecified: Secondary | ICD-10-CM | POA: Diagnosis not present

## 2020-03-24 DIAGNOSIS — I10 Essential (primary) hypertension: Secondary | ICD-10-CM | POA: Diagnosis not present

## 2020-03-24 DIAGNOSIS — Z1389 Encounter for screening for other disorder: Secondary | ICD-10-CM | POA: Diagnosis not present

## 2020-03-24 DIAGNOSIS — E782 Mixed hyperlipidemia: Secondary | ICD-10-CM | POA: Diagnosis not present

## 2020-03-24 DIAGNOSIS — E119 Type 2 diabetes mellitus without complications: Secondary | ICD-10-CM | POA: Diagnosis not present

## 2020-03-24 DIAGNOSIS — Z0001 Encounter for general adult medical examination with abnormal findings: Secondary | ICD-10-CM | POA: Diagnosis not present

## 2020-03-29 DIAGNOSIS — I2510 Atherosclerotic heart disease of native coronary artery without angina pectoris: Secondary | ICD-10-CM | POA: Diagnosis not present

## 2020-03-29 DIAGNOSIS — N529 Male erectile dysfunction, unspecified: Secondary | ICD-10-CM | POA: Diagnosis not present

## 2020-03-29 DIAGNOSIS — I10 Essential (primary) hypertension: Secondary | ICD-10-CM | POA: Diagnosis not present

## 2020-03-29 DIAGNOSIS — E119 Type 2 diabetes mellitus without complications: Secondary | ICD-10-CM | POA: Diagnosis not present

## 2020-03-29 DIAGNOSIS — E782 Mixed hyperlipidemia: Secondary | ICD-10-CM | POA: Diagnosis not present

## 2020-03-29 DIAGNOSIS — N401 Enlarged prostate with lower urinary tract symptoms: Secondary | ICD-10-CM | POA: Diagnosis not present

## 2020-03-29 DIAGNOSIS — N410 Acute prostatitis: Secondary | ICD-10-CM | POA: Diagnosis not present

## 2020-08-02 DIAGNOSIS — N529 Male erectile dysfunction, unspecified: Secondary | ICD-10-CM | POA: Diagnosis not present

## 2020-08-02 DIAGNOSIS — I10 Essential (primary) hypertension: Secondary | ICD-10-CM | POA: Diagnosis not present

## 2020-08-02 DIAGNOSIS — E119 Type 2 diabetes mellitus without complications: Secondary | ICD-10-CM | POA: Diagnosis not present

## 2020-08-02 DIAGNOSIS — E782 Mixed hyperlipidemia: Secondary | ICD-10-CM | POA: Diagnosis not present

## 2020-08-02 DIAGNOSIS — Z0001 Encounter for general adult medical examination with abnormal findings: Secondary | ICD-10-CM | POA: Diagnosis not present

## 2020-08-02 DIAGNOSIS — I2510 Atherosclerotic heart disease of native coronary artery without angina pectoris: Secondary | ICD-10-CM | POA: Diagnosis not present

## 2020-08-02 DIAGNOSIS — Z1329 Encounter for screening for other suspected endocrine disorder: Secondary | ICD-10-CM | POA: Diagnosis not present

## 2020-08-02 DIAGNOSIS — N401 Enlarged prostate with lower urinary tract symptoms: Secondary | ICD-10-CM | POA: Diagnosis not present

## 2020-08-03 DIAGNOSIS — N529 Male erectile dysfunction, unspecified: Secondary | ICD-10-CM | POA: Diagnosis not present

## 2020-08-03 DIAGNOSIS — E782 Mixed hyperlipidemia: Secondary | ICD-10-CM | POA: Diagnosis not present

## 2020-08-03 DIAGNOSIS — I2510 Atherosclerotic heart disease of native coronary artery without angina pectoris: Secondary | ICD-10-CM | POA: Diagnosis not present

## 2020-08-03 DIAGNOSIS — N401 Enlarged prostate with lower urinary tract symptoms: Secondary | ICD-10-CM | POA: Diagnosis not present

## 2020-08-03 DIAGNOSIS — E119 Type 2 diabetes mellitus without complications: Secondary | ICD-10-CM | POA: Diagnosis not present

## 2020-08-03 DIAGNOSIS — I10 Essential (primary) hypertension: Secondary | ICD-10-CM | POA: Diagnosis not present

## 2020-09-09 ENCOUNTER — Encounter: Payer: Self-pay | Admitting: Cardiology

## 2020-09-09 ENCOUNTER — Other Ambulatory Visit: Payer: Self-pay

## 2020-09-09 ENCOUNTER — Ambulatory Visit: Payer: Medicare Other | Admitting: Cardiology

## 2020-09-09 VITALS — BP 156/67 | HR 59 | Resp 15 | Ht 72.0 in | Wt 212.0 lb

## 2020-09-09 DIAGNOSIS — I25118 Atherosclerotic heart disease of native coronary artery with other forms of angina pectoris: Secondary | ICD-10-CM | POA: Diagnosis not present

## 2020-09-09 DIAGNOSIS — I77810 Thoracic aortic ectasia: Secondary | ICD-10-CM

## 2020-09-09 DIAGNOSIS — R600 Localized edema: Secondary | ICD-10-CM

## 2020-09-09 DIAGNOSIS — E7800 Pure hypercholesterolemia, unspecified: Secondary | ICD-10-CM | POA: Diagnosis not present

## 2020-09-09 DIAGNOSIS — I10 Essential (primary) hypertension: Secondary | ICD-10-CM

## 2020-09-09 DIAGNOSIS — I6523 Occlusion and stenosis of bilateral carotid arteries: Secondary | ICD-10-CM | POA: Diagnosis not present

## 2020-09-09 MED ORDER — FUROSEMIDE 20 MG PO TABS: 20.0000 mg | ORAL_TABLET | Freq: Two times a day (BID) | ORAL | 3 refills | Status: DC | PRN

## 2020-09-09 MED ORDER — OLMESARTAN MEDOXOMIL-HCTZ 40-25 MG PO TABS: 1.0000 | ORAL_TABLET | ORAL | 2 refills | Status: DC

## 2020-09-09 MED ORDER — LABETALOL HCL 200 MG PO TABS: 200.0000 mg | ORAL_TABLET | Freq: Two times a day (BID) | ORAL | 3 refills | Status: DC

## 2020-09-09 MED ORDER — EZETIMIBE 10 MG PO TABS: 10.0000 mg | ORAL_TABLET | Freq: Every day | ORAL | 2 refills | Status: DC

## 2020-09-09 NOTE — Progress Notes (Signed)
Primary Physician/Referring:  Benito Mccreedy, MD  Patient ID: Corey Beard, male    DOB: Jun 18, 1946, 74 y.o.   MRN: [id number]  Chief Complaint  Patient presents with   Follow-up    1 year   Hypertension    HPI: Corey Beard  is a 74 y.o. 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   He also has controlled diabetes mellitus, hyperlipidemia, hypertension and history of prostate cancer, and tobacco use disorder in the form of smoking cigarettes.   In the past few months he has noticed his blood pressure to be elevated.  He also states that he has developed marked leg edema after he was started back on amlodipine for hypertension.  He has not had any chest pain and denies symptoms of claudication.  He is still smoking about 5 to 10 cigarettes a day.  Past Medical History:  Diagnosis Date   Arthritis    Glaucoma, both eyes    History of MI (myocardial infarction)    2002   Nocturia    PAF (paroxysmal atrial fibrillation) ([place])    Prostate cancer [facility]) urologist-- dr eskridge    T1c, Gleason 3+3, PSA 9.83, vol 61cc   S/P drug eluting coronary stent placement    Type 2 diabetes mellitus ([place])    Wears glasses    Past Surgical History:  Procedure Laterality Date   CORONARY ANGIOPLASTY  07-01-2001  dr  Terrence Dupont   PCI to  pLAD, mRCA, ostium D1   CORONARY ANGIOPLASTY  12-04-2001  dr Terrence Dupont   PCI  to  ostial D1,  pLAD   CORONARY ANGIOPLASTY WITH STENT PLACEMENT  09-03-2002  dr Terrence Dupont   PCI and DES x1 to pLAD   CYSTOSCOPY WITH BIOPSY  10/04/2015   Procedure: CYSTOSCOPY WITH BIOPSY;  Surgeon: Festus Aloe, MD;  Location: [facility];  Service: Urology;;   INTRAVASCULAR PRESSURE WIRE/FFR STUDY N/A  07/23/2017   Procedure: INTRAVASCULAR PRESSURE WIRE/FFR STUDY;  Surgeon: Adrian Prows, MD;  Location: [place] [facility];  Service: Cardiovascular;  Laterality: N/A;   LEFT HEART CATH AND CORONARY ANGIOGRAPHY N/A 07/23/2017   Procedure: LEFT HEART CATH AND CORONARY ANGIOGRAPHY;  Surgeon: Adrian Prows, MD;  Location: [place] [facility];  Service: Cardiovascular;  Laterality: N/A;   RADIOACTIVE SEED IMPLANT N/A 10/04/2015   Procedure: RADIOACTIVE SEED IMPLANT/BRACHYTHERAPY IMPLANT;  Surgeon: Festus Aloe, MD;  Location: [facility];  Service: Urology;  Laterality: N/A;   REPAIR  INCARCERATED EPIGASTRIC HERNIA  06-04-2002   Social History   Tobacco Use   Smoking status: Current Every Day Smoker    Packs/day: 0.50    Years: 13.00    Pack years: 6.50    Types: Cigarettes   Smokeless tobacco: Never Used  Substance Use Topics   Alcohol use: No    Alcohol/week: 0.0 standard drinks  Marital Status: Married    Current Outpatient Medications on File Prior to Visit  Medication Sig Dispense Refill   aspirin EC 325 MG tablet Take 325 mg by mouth daily.     atorvastatin (LIPITOR) 20 MG tablet Take 20 mg by mouth daily.     Cholecalciferol (VITAMIN D3) 1000 UNITS CAPS Take 1,000 Units by mouth daily.      L-ARGININE-500 PO  Take 1,000 mg by mouth daily.     metFORMIN (GLUCOPHAGE-XR) 500 MG 24 hr tablet Take 500 mg by mouth 2 (two) times a day.     Multiple Vitamins-Minerals (MULTIVITAMIN PO) Take 1 tablet by mouth daily.     spironolactone (ALDACTONE) 25 MG tablet Take 1 tablet (25 mg total) by mouth every other day. 90 tablet 1   tamsulosin (FLOMAX) 0.4 MG CAPS capsule Take 0.4 mg by mouth daily.     TRAVATAN Z 0.004 % SOLN ophthalmic solution Place 1 drop into both eyes at bedtime.  0   No current facility-administered medications on file prior to visit.   Review of Systems  Constitutional: Negative for chills, decreased appetite, malaise/fatigue and weight gain.   Cardiovascular: Positive for dyspnea on exertion and leg swelling. Negative for syncope.  Respiratory: Positive for cough (chronic).   Endocrine: Negative for cold intolerance.  Hematologic/Lymphatic: Does not bruise/bleed easily.  Musculoskeletal: Positive for gout. Negative for joint swelling.  Gastrointestinal: Positive for heartburn (helped by Pepcid). Negative for abdominal pain, anorexia and change in bowel habit.  Genitourinary: Positive for bladder incontinence and hesitancy.  Neurological: Negative for headaches and light-headedness.  Psychiatric/Behavioral: Negative for depression and substance abuse.  All other systems reviewed and are negative.  Objective  Blood pressure (!) 156/67, pulse (!) 59, resp. rate 15, height 6' (1.829 m), weight 212 lb (96.2 kg), SpO2 98 %. Body mass index is 28.75 kg/m.   Vitals with BMI 09/09/2020 07/16/2019 05/21/2019  Height _0  _1  _2   Weight 212 lbs 204 lbs 201 lbs  BMI 28.75 03.70 48.88  Systolic 916 945 038  Diastolic 67 55 64  Pulse 59 65 -     Physical Exam Constitutional:      General: He is not in acute distress.    Appearance: He is well-developed.  HENT:     Head: Atraumatic.  Eyes:     Conjunctiva/sclera: Conjunctivae normal.  Neck:     Thyroid: No thyromegaly.     Vascular: No JVD.  Cardiovascular:     Rate and Rhythm: Normal rate and regular rhythm.     Pulses:          Carotid pulses are on the right side with bruit and on the left side with bruit.      Femoral pulses are 2+ on the right side and 2+ on the left side.      Popliteal pulses are 2+ on the right side and 2+ on the left side.       Dorsalis pedis pulses are 2+ on the right side and 1+ on the left side.       Posterior tibial pulses are 2+ on the right side and 1+ on the left side.     Heart sounds: S1 normal and S2 normal. Murmur heard.  Early systolic murmur is present with a grade of 2/6 at the upper right sternal border radiating to the neck.  No  gallop.      Comments: 2-3+ bilateral pitting lower extremity edema.  There is no JVD. Pulmonary:     Effort: Pulmonary effort is normal.     Breath sounds: Normal breath sounds.  Abdominal:     General: Bowel sounds are normal.     Palpations: Abdomen is soft.  Musculoskeletal:        General: Normal range of motion.     Cervical back: Neck supple.  Skin:    General: Skin is warm and  dry.  Neurological:     Mental Status: He is alert.    Radiology: No results found.  Laboratory examination:   BMP Latest Ref Rng & Units 07/10/2019 09/27/2015 07/16/2011  Glucose 65 - 99 mg/dL 83 85 78  BUN 8 - 27 mg/dL _0 Creatinine 0.76 - 1.27 mg/dL 1.24 0.86 1.28  BUN/Creat Ratio 10 - 24 15 - -  Sodium 134 - 144 mmol/L 139 139 138  Potassium 3.5 - 5.2 mmol/L 5.4(H) 4.1 3.8  Chloride 96 - 106 mmol/L 100 103 100  CO2 20 - 29 mmol/L _1 Calcium 8.6 - 10.2 mg/dL 10.1 9.8 9.0    External labs:  Cholesterol, total 209.000 M 03/24/2020 HDL 104.000 M 03/24/2020 LDL-C 90.000 MG 05/05/2019 Triglycerides 55.000 MG 03/24/2020  A1C 5.500 % 03/24/2020  Hemoglobin 12.400 G/ 05/05/2019  Creatinine, Serum 0.990 MG/ 03/24/2020 Potassium 5.400 mm 07/10/2019 ALT (SGPT) 14.000 IU/ 03/24/2020  Labs 05/05/2019: Total cholesterol 183, triglycerides 59, HDL 81, LDL 90.  Serum glucose 97 mg, BUN 22, creatinine 1.04, eGFR greater than 60 mL, potassium 4.2.  CBC normal.  A1c 6.1%.  TSH normal.  Labs 07/31/2018: Total cholesterol 157, triglycerides 53, HDL 73, LDL 73.  BUN 18, creatinine 1.10.  CMP otherwise normal.  A1c 5.7%.  TSH normal previously.  Cardiac Studies:   Coronary Angiogram [07/23/2017]: Coronary angiogram 07/23/2017: Moderate disease in the right coronary artery. LAD diffuse disease, proximal to the stent is a 50% stenosis at most 60%, negative FFR. Occluded sandwiched D1 at the LAD stent and diagonal stent. Collaterals present. Mild disease in circumflex and ramus. Medical therapy. H/O  stents 1995 and 2003.  Abdominal aortic duplex 06/27/2017 Normal abdominal aorta. No evidence of abdominal aortic aneurysm. Normal iliac velocity bilateral.  Lexiscan myoview stress test 06/03/2017 1. Patient attempted treadmill and requested to stop. The resting electrocardiogram demonstrated normal sinus rhythm, normal resting conduction, no resting arrhythmias and normal rest repolarization.  Stress EKG is non-diagnostic for ischemia as it a pharmacologic stress using Lexiscan. Stress symptoms included dyspnea and chest pressure. 2. SPECT images demonstrate Medium perfusion abnormality of moderate intensity in the basal inferior, mid inferior and apical inferior myocardial wall(s) on the stress images. The defect is present on the resting images consistent with prior myocardial infarction.   SPECT images demonstrate Medium perfusion abnormality of mild intensity in the mid anterolateral and apical lateral myocardial wall(s) on the stress images.  The defect is not present on the resting images consistent with ischemia.  Gated SPECT imaging demonstrates akinesis of the basal inferior, mid inferior and apical inferior myocardial wall(s).  The left ventricular ejection fraction was calculated or visually estimated to be 37%.  This is a high risk study, consider further cardiac work-up. Compared to Nuclear stress testing 05/06/2002, inferior scar is new.  Echocardiogram 07/08/2019: Left ventricle cavity is normal in size. Moderate concentric hypertrophy of the left ventricle. Normal LV systolic function with EF 67%. Normal global wall motion. Doppler evidence of grade I (impaired) diastolic dysfunction, normal LAP.  Left atrial cavity is mildly dilated. Trileaflet aortic valve. Mild aortic valve leaflet calcification. Mild aortic valve stenosis. Aortic valve mean gradient of 9 mmHg, Vmax of 2.0 m/s. Calculated aortic valve area by continuity equation is 2.1 cm. Mild to moderate aortic regurgitation. Mild  (Grade I) mitral regurgitation. The aortic root is mildly dilated at 4.0 cm. Estimated RA pressure 8 mmHg. Compared to previous study on 06/28/2015, aortic root is stable.   Carotid  artery duplex  07/08/2019: Stenosis in the right internal carotid artery (16-49%). Stenosis in the right external carotid artery (<50%). Mild stenosis in the left carotid bulb and common carotid artery (<50%). Stenosis in the left external carotid artery (<50%). Antegrade right vertebral artery flow. Antegrade left vertebral artery flow. Compared to the study done on 06/27/2017, previously there was mild to moderate diffuse plaque without hemodynamically significant stenosis. Follow up in one year is appropriate if clinically indicated.  EKG:   EKG 09/09/2020: Sinus bradycardia at rate of 59 bpm, left atrial enlargement, normal axis, right bundle branch block.  Occasional PACs.  No significant change from EKG 05/21/2019, PVCs (2) not evident.  Assessment     ICD-10-CM   1. Essential hypertension  I10 EKG 12-Lead    labetalol (NORMODYNE) 200 MG tablet    olmesartan-hydrochlorothiazide (BENICAR HCT) 40-25 MG tablet    furosemide (LASIX) 20 MG tablet    ezetimibe (ZETIA) 10 MG tablet    Basic metabolic panel    Basic metabolic panel    PCV ECHOCARDIOGRAM COMPLETE    Basic metabolic panel  2. Bilateral leg edema  R60.0 furosemide (LASIX) 20 MG tablet  3. Asymptomatic bilateral carotid artery stenosis  I65.23   4. Coronary artery disease of native artery of native heart with stable angina pectoris ([place])  I25.118 PCV ECHOCARDIOGRAM COMPLETE  5. Hypercholesteremia  E78.00 ezetimibe (ZETIA) 10 MG tablet    Lipid Panel With LDL/HDL Ratio    Lipid Panel With LDL/HDL Ratio  6. Aortic root dilatation ([facility])  I77.810 PCV ECHOCARDIOGRAM COMPLETE   Meds ordered this encounter  Medications   labetalol (NORMODYNE) 200 MG tablet    Sig: Take 1 tablet (200 mg total) by mouth 2 (two) times daily.    Dispense:  60  tablet    Refill:  3   olmesartan-hydrochlorothiazide (BENICAR HCT) 40-25 MG tablet    Sig: Take 1 tablet by mouth every morning.    Dispense:  30 tablet    Refill:  2   furosemide (LASIX) 20 MG tablet    Sig: Take 1 tablet (20 mg total) by mouth 2 (two) times daily as needed for edema.    Dispense:  30 tablet    Refill:  3   ezetimibe (ZETIA) 10 MG tablet    Sig: Take 1 tablet (10 mg total) by mouth daily after supper.    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   He also has controlled diabetes mellitus, hyperlipidemia, hypertension and history of prostate cancer, and tobacco use disorder in the form of smoking cigarettes.   Patient presents here to annual visit, he has 3+ bilateral pitting edema probably related to amlodipine.  Amlodipine/olmesartan discontinued and I will switch him to Benicar HCT 40/25 mg in the morning.  He does have history of gout and this needs to be kept in mind.  Due to marked discomfort in his legs, do not suspect DVT there  is no sign 7 rumination, I will use furosemide for a week or 10 days.  Clear-cut instructions were given to the patient.  He does have hyperkalemia and is presently using Aldactone every other day could not tolerate daily due to hyperkalemia.  Hence I do not think I need to replace potassium.  I will obtain a BMP in 2 weeks.    His blood pressure is not well controlled, I will also discontinue atenolol 50 mg daily and  switch him to labetalol 200 mg p.o. twice daily.  I also reviewed his external labs, his lipids are not well controlled and in view of underlying coronary artery disease and carotid disease, will add Zetia 10 mg in the evening.  I will check his lipids in 1 month along with repeat BMP in 1 month.  He needs repeat echocardiogram to follow-up on aortic root dilatation.  He also needs follow-up carotid artery duplex.  I would like to see him back in 4 to 5 weeks for follow-up.  This was a 40-minute encounter with evaluation of multisystem disease process, evaluation of external labs and coordination of care.   Adrian Prows, MD, [facility] 09/09/2020, 11:36 AM Office: [phone] Pager: [phone]

## 2020-09-09 NOTE — Patient Instructions (Signed)
Please stop Benicar/amlodipine combination I am going to switch her to Benicar HCT 40/25 mg in the morning.  He will also take furosemide 20 mg twice daily 1 in the morning and 1 in the late afternoon to reduce your leg swelling related to amlodipine, please take it for 5 to 6 days and then stop it and use it sparingly if the leg swells.  Try to use support stockings and also keep your foot elevated when you are resting, would probably sleep flat on the sofa instead of a recliner while resting and watching TV.  Discontinue atenolol, I will start her on labetalol 200 mg twice daily for blood pressure.  I have added Zetia to decrease your cholesterol, take it in the evening after supper.  I would like to have blood work done in 2 weeks.  Please remind the lab [facility]) that you only need BMP and not cholesterol in 2 weeks.  You will get another blood work in 1 month that includes repeat BMP and lipid panel.   Adrian Prows, MD, [facility] 09/09/2020, 11:31 AM Office: [phone] Pager: [phone]

## 2020-09-12 DIAGNOSIS — R7989 Other specified abnormal findings of blood chemistry: Secondary | ICD-10-CM | POA: Diagnosis not present

## 2020-09-12 DIAGNOSIS — I2510 Atherosclerotic heart disease of native coronary artery without angina pectoris: Secondary | ICD-10-CM | POA: Diagnosis not present

## 2020-09-12 DIAGNOSIS — E119 Type 2 diabetes mellitus without complications: Secondary | ICD-10-CM | POA: Diagnosis not present

## 2020-09-12 DIAGNOSIS — E782 Mixed hyperlipidemia: Secondary | ICD-10-CM | POA: Diagnosis not present

## 2020-09-12 DIAGNOSIS — N529 Male erectile dysfunction, unspecified: Secondary | ICD-10-CM | POA: Diagnosis not present

## 2020-09-12 DIAGNOSIS — I10 Essential (primary) hypertension: Secondary | ICD-10-CM | POA: Diagnosis not present

## 2020-09-12 DIAGNOSIS — N401 Enlarged prostate with lower urinary tract symptoms: Secondary | ICD-10-CM | POA: Diagnosis not present

## 2020-09-15 ENCOUNTER — Other Ambulatory Visit: Payer: Self-pay

## 2020-09-15 ENCOUNTER — Ambulatory Visit: Payer: Medicare PPO

## 2020-09-15 DIAGNOSIS — I25118 Atherosclerotic heart disease of native coronary artery with other forms of angina pectoris: Secondary | ICD-10-CM

## 2020-09-15 DIAGNOSIS — I6523 Occlusion and stenosis of bilateral carotid arteries: Secondary | ICD-10-CM

## 2020-09-15 DIAGNOSIS — I77810 Thoracic aortic ectasia: Secondary | ICD-10-CM | POA: Diagnosis not present

## 2020-09-15 DIAGNOSIS — I10 Essential (primary) hypertension: Secondary | ICD-10-CM | POA: Diagnosis not present

## 2020-09-16 ENCOUNTER — Other Ambulatory Visit: Payer: Self-pay | Admitting: Cardiology

## 2020-09-16 DIAGNOSIS — I6523 Occlusion and stenosis of bilateral carotid arteries: Secondary | ICD-10-CM

## 2020-10-10 DIAGNOSIS — I10 Essential (primary) hypertension: Secondary | ICD-10-CM | POA: Diagnosis not present

## 2020-10-10 DIAGNOSIS — E7800 Pure hypercholesterolemia, unspecified: Secondary | ICD-10-CM | POA: Diagnosis not present

## 2020-10-11 LAB — BASIC METABOLIC PANEL
BUN/Creatinine Ratio: 17 (ref 10–24)
BUN: 22 mg/dL (ref 8–27)
CO2: 25 mmol/L (ref 20–29)
Calcium: 9.3 mg/dL (ref 8.6–10.2)
Chloride: 97 mmol/L (ref 96–106)
Creatinine, Ser: 1.28 mg/dL — ABNORMAL HIGH (ref 0.76–1.27)
GFR calc Af Amer: 64 mL/min/{1.73_m2} (ref >59)
GFR calc non Af Amer: 56 mL/min/{1.73_m2} — ABNORMAL LOW (ref >59)
Glucose: 91 mg/dL (ref 65–99)
Potassium: 4.1 mmol/L (ref 3.5–5.2)
Sodium: 136 mmol/L (ref 134–144)

## 2020-10-11 LAB — LIPID PANEL WITH LDL/HDL RATIO
Cholesterol, Total: 176 mg/dL (ref 100–199)
HDL: 87 mg/dL (ref >39)
LDL Chol Calc (NIH): 77 mg/dL (ref 0–99)
LDL/HDL Ratio: 0.9 ratio (ref 0.0–3.6)
Triglycerides: 62 mg/dL (ref 0–149)
VLDL Cholesterol Cal: 12 mg/dL (ref 5–40)

## 2020-10-14 ENCOUNTER — Ambulatory Visit: Payer: Medicare PPO | Admitting: Cardiology

## 2020-10-14 ENCOUNTER — Other Ambulatory Visit: Payer: Self-pay

## 2020-10-14 ENCOUNTER — Encounter: Payer: Self-pay | Admitting: Cardiology

## 2020-10-14 VITALS — BP 114/52 | HR 63 | Resp 16 | Ht 72.0 in | Wt 211.0 lb

## 2020-10-14 DIAGNOSIS — I10 Essential (primary) hypertension: Secondary | ICD-10-CM

## 2020-10-14 DIAGNOSIS — R600 Localized edema: Secondary | ICD-10-CM

## 2020-10-14 DIAGNOSIS — I872 Venous insufficiency (chronic) (peripheral): Secondary | ICD-10-CM

## 2020-10-14 DIAGNOSIS — I77810 Thoracic aortic ectasia: Secondary | ICD-10-CM | POA: Diagnosis not present

## 2020-10-14 DIAGNOSIS — I6523 Occlusion and stenosis of bilateral carotid arteries: Secondary | ICD-10-CM

## 2020-10-14 DIAGNOSIS — E7800 Pure hypercholesterolemia, unspecified: Secondary | ICD-10-CM | POA: Diagnosis not present

## 2020-10-14 MED ORDER — EZETIMIBE 10 MG PO TABS: 10.0000 mg | ORAL_TABLET | Freq: Every day | ORAL | 3 refills | Status: DC

## 2020-10-14 MED ORDER — LABETALOL HCL 100 MG PO TABS: 100.0000 mg | ORAL_TABLET | Freq: Two times a day (BID) | ORAL | 3 refills | Status: DC

## 2020-10-14 NOTE — Progress Notes (Signed)
Primary Physician/Referring:  Benito Mccreedy, MD  Patient ID: Corey Beard, male    DOB: [birth year], 74 y.o.   MRN: [id number]  Chief Complaint  Patient presents with  . Coronary Artery Disease  . Hyperlipidemia  . Hypertension  . Follow-up    6 week    HPI: Corey Beard  is a 74 y.o. 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   He also has controlled diabetes mellitus, hyperlipidemia, hypertension and history of prostate cancer, asymptomatic carotid artery stenosis and tobacco use disorder in the form of smoking cigarettes.   I have seen him 6 weeks ago and switched from Benicar to Benicar HCT, discontinued amlodipine and discontinued metoprolol and switched him to labetalol.  His blood pressure now has improved and controlled but he feels dizzy occasionally.  His leg edema has not changed much in spite of addition of Lasix.  He still has discomfort in his legs.  No TIA, no headache or visual disturbances.  Past Medical History:  Diagnosis Date  . Arthritis   . Glaucoma, both eyes   . History of MI (myocardial infarction)    2002  . Nocturia   . PAF (paroxysmal atrial fibrillation) ([place])   . Prostate cancer [facility]) urologist-- dr eskridge    T1c, Gleason 3+3, PSA 9.83, vol 61cc  . S/P drug eluting coronary stent placement   . Type 2 diabetes mellitus ([place])   . Wears glasses    Past Surgical History:  Procedure Laterality Date  . CORONARY ANGIOPLASTY  07-01-2001  dr  Terrence Dupont   PCI to  pLAD, mRCA, ostium D1  . CORONARY ANGIOPLASTY  12-04-2001  dr Terrence Dupont   PCI  to  ostial D1,  pLAD  . CORONARY ANGIOPLASTY WITH STENT PLACEMENT  09-03-2002  dr Terrence Dupont   PCI and DES x1 to pLAD  . CYSTOSCOPY WITH BIOPSY  10/04/2015    Procedure: CYSTOSCOPY WITH BIOPSY;  Surgeon: Festus Aloe, MD;  Location: [facility];  Service: Urology;;  . INTRAVASCULAR PRESSURE WIRE/FFR STUDY N/A 07/23/2017   Procedure: INTRAVASCULAR PRESSURE WIRE/FFR STUDY;  Surgeon: Adrian Prows, MD;  Location: [place] [facility];  Service: Cardiovascular;  Laterality: N/A;  . LEFT HEART CATH AND CORONARY ANGIOGRAPHY N/A 07/23/2017   Procedure: LEFT HEART CATH AND CORONARY ANGIOGRAPHY;  Surgeon: Adrian Prows, MD;  Location: [place] [facility];  Service: Cardiovascular;  Laterality: N/A;  . RADIOACTIVE SEED IMPLANT N/A 10/04/2015   Procedure: RADIOACTIVE SEED IMPLANT/BRACHYTHERAPY IMPLANT;  Surgeon: Festus Aloe, MD;  Location: [facility];  Service: Urology;  Laterality: N/A;  . REPAIR  INCARCERATED EPIGASTRIC HERNIA  06-04-2002   Social History   Tobacco Use  . Smoking status: Current Every Day Smoker    Packs/day: 0.50    Years: 13.00    Pack years: 6.50    Types: Cigarettes  . Smokeless tobacco: Never Used  Substance Use Topics  . Alcohol use: No    Alcohol/week: 0.0 standard drinks  Marital Status: Married    Current Outpatient Medications on File Prior to Visit  Medication Sig Dispense Refill  . aspirin EC 325 MG tablet Take 325 mg by mouth daily.    Marland Kitchen atorvastatin (LIPITOR) 20 MG tablet Take 20 mg by mouth daily.    Marland Kitchen  Cholecalciferol (VITAMIN D3) 1000 UNITS CAPS Take 1,000 Units by mouth daily.     . furosemide (LASIX) 20 MG tablet Take 1 tablet (20 mg total) by mouth 2 (two) times daily as needed for edema. 30 tablet 3  . L-ARGININE-500 PO Take 1,000 mg by mouth daily.    . metFORMIN (GLUCOPHAGE-XR) 500 MG 24 hr tablet Take 500 mg by mouth daily with breakfast.     . Multiple Vitamins-Minerals (MULTIVITAMIN PO) Take 1 tablet by mouth daily.    Marland Kitchen olmesartan-hydrochlorothiazide (BENICAR HCT) 40-25 MG tablet Take 1 tablet by mouth every morning. 30 tablet 2  . tamsulosin (FLOMAX) 0.4 MG CAPS capsule Take  0.4 mg by mouth daily.    . TRAVATAN Z 0.004 % SOLN ophthalmic solution Place 1 drop into both eyes at bedtime.  0   No current facility-administered medications on file prior to visit.   Review of Systems  Cardiovascular: Positive for dyspnea on exertion and leg swelling. Negative for syncope.  Respiratory: Positive for cough (chronic).   Musculoskeletal: Positive for gout. Negative for joint swelling.  Gastrointestinal: Positive for heartburn (helped by Pepcid). Negative for abdominal pain.  Genitourinary: Positive for bladder incontinence and hesitancy.  Psychiatric/Behavioral: Negative for depression.  All other systems reviewed and are negative.  Objective  Blood pressure (!) 114/52, pulse 63, resp. rate 16, height 6' (1.829 m), weight 211 lb (95.7 kg), SpO2 96 %. Body mass index is 28.62 kg/m.   Vitals with BMI 10/14/2020 09/09/2020 07/16/2019  Height 6' 0" 6' 0" 6' 0"  Weight 211 lbs 212 lbs 204 lbs  BMI 28.61 81.19 14.78  Systolic 295 621 308  Diastolic 52 67 55  Pulse 63 59 65     Physical Exam Constitutional:      General: He is not in acute distress.    Appearance: He is well-developed.  HENT:     Head: Atraumatic.  Eyes:     Conjunctiva/sclera: Conjunctivae normal.  Neck:     Thyroid: No thyromegaly.     Vascular: No JVD.  Cardiovascular:     Rate and Rhythm: Normal rate and regular rhythm.     Pulses:          Carotid pulses are on the right side with bruit and on the left side with bruit.      Femoral pulses are 2+ on the right side and 2+ on the left side.      Popliteal pulses are 2+ on the right side and 2+ on the left side.       Dorsalis pedis pulses are 2+ on the right side and 1+ on the left side.       Posterior tibial pulses are 2+ on the right side and 1+ on the left side.     Heart sounds: S1 normal and S2 normal. Murmur heard.  Early systolic murmur is present with a grade of 2/6 at the upper right sternal border radiating to the neck.  No gallop.       Comments: 2-3+ bilateral pitting lower extremity edema.  There is no JVD. Pulmonary:     Effort: Pulmonary effort is normal.     Breath sounds: Normal breath sounds.  Abdominal:     General: Bowel sounds are normal.     Palpations: Abdomen is soft.  Musculoskeletal:        General: Normal range of motion.     Cervical back: Neck supple.  Skin:    General: Skin is warm  and dry.  Neurological:     Mental Status: He is alert.    Radiology: No results found.  Laboratory examination:   BMP Latest Ref Rng & Units 10/10/2020 07/10/2019 09/27/2015  Glucose 65 - 99 mg/dL 91 83 85  BUN 8 - 27 mg/dL _0 Creatinine 0.76 - 1.27 mg/dL 1.28(H) 1.24 0.86  BUN/Creat Ratio 10 - _1 -  Sodium 134 - 144 mmol/L 136 139 139  Potassium 3.5 - 5.2 mmol/L 4.1 5.4(H) 4.1  Chloride 96 - 106 mmol/L 97 100 103  CO2 20 - 29 mmol/L _2 Calcium 8.6 - 10.2 mg/dL 9.3 10.1 9.8    Lipid Panel Recent Labs    10/10/20 0810  CHOL 176  TRIG 62  LDLCALC 77  HDL 87   NHDL 87  External labs:  Cholesterol, total 209.000 M 03/24/2020 HDL 104.000 M 03/24/2020 LDL-C 90.000 MG 05/05/2019 Triglycerides 55.000 MG 03/24/2020  A1C 5.500 % 03/24/2020  Hemoglobin 12.400 G/ 05/05/2019  Creatinine, Serum 0.990 MG/ 03/24/2020 Potassium 5.400 mm 07/10/2019 ALT (SGPT) 14.000 IU/ 03/24/2020  Labs 05/05/2019: Total cholesterol 183, triglycerides 59, HDL 81, LDL 90.  Serum glucose 97 mg, BUN 22, creatinine 1.04, eGFR greater than 60 mL, potassium 4.2.  CBC normal.  A1c 6.1%.  TSH normal.  Labs 07/31/2018: Total cholesterol 157, triglycerides 53, HDL 73, LDL 73.  BUN 18, creatinine 1.10.  CMP otherwise normal.  A1c 5.7%.  TSH normal previously.  Cardiac Studies:   Coronary Angiogram [07/23/2017]: Coronary angiogram 07/23/2017: Moderate disease in the right coronary artery. LAD diffuse disease, proximal to the stent is a 50% stenosis at most 60%, negative FFR. Occluded sandwiched D1 at the LAD stent and  diagonal stent. Collaterals present. Mild disease in circumflex and ramus. Medical therapy. H/O stents 1995 and 2003.  Abdominal aortic duplex 06/27/2017 Normal abdominal aorta. No evidence of abdominal aortic aneurysm. Normal iliac velocity bilateral.  Lexiscan myoview stress test 06/03/2017 1. Patient attempted treadmill and requested to stop. The resting electrocardiogram demonstrated normal sinus rhythm, normal resting conduction, no resting arrhythmias and normal rest repolarization.  Stress EKG is non-diagnostic for ischemia as it a pharmacologic stress using Lexiscan. Stress symptoms included dyspnea and chest pressure. 2. SPECT images demonstrate Medium perfusion abnormality of moderate intensity in the basal inferior, mid inferior and apical inferior myocardial wall(s) on the stress images. The defect is present on the resting images consistent with prior myocardial infarction.   SPECT images demonstrate Medium perfusion abnormality of mild intensity in the mid anterolateral and apical lateral myocardial wall(s) on the stress images.  The defect is not present on the resting images consistent with ischemia.  Gated SPECT imaging demonstrates akinesis of the basal inferior, mid inferior and apical inferior myocardial wall(s).  The left ventricular ejection fraction was calculated or visually estimated to be 37%.  This is a high risk study, consider further cardiac work-up. Compared to Nuclear stress testing 05/06/2002, inferior scar is new.  Carotid artery duplex 09/15/2020: Doppler velocity suggests stenosis in the right internal carotid artery (50-69%), stenosis in the right common carotid artery (<50%) and stenosis in the right external carotid artery (<50%). Doppler velocity suggests stenosis in the left internal carotid artery (50-69%), stenosis in the left common carotid artery (<50%) and stenosis in the left external carotid artery (<50%). In addition the petrous portion of the left ICA  appears to have high turbulence and may suggest high grade stenosis. Antegrade right vertebral artery flow. Antegrade left  vertebral artery flow. Compared to the study done on 07/08/2019, there is progression of disease in bilateral carotid arteries. Previously right 16 to 49% and left carotid bulb less than 50% stenosis. Follow up in six months is appropriate if clinically indicated. Consider different modality evaluation.  Echocardiogram 09/15/2020:  Left ventricle cavity is normal in size. Moderate concentric hypertrophy of the left ventricle. Normal global wall motion. Doppler evidence of grade I (impaired) diastolic dysfunction, normal LAP. Normal LV systolic function with visual EF 55-60%. Calculated EF 50%. Left atrial cavity is severely dilated at 5 cm. Mildly restricted aortic valve leaflets. Trileaflet aortic valve. Trace aortic stenosis. Mild (Grade I) to moderate (Grade II) aortic regurgitation. Peak velocity 2.26ms, Peak Gradient 22.8 mmHg, Mean Gradient 11.2 mmHg, AVA 1.99 cm. Trace mitral stenosis. Moderate (Grade II) mitral regurgitation. Mildly restricted mitral valve leaflets. The aortic root is mildly dilated at 4.2 CM. IVC is dilated with poor inspiration collapse consistent with elevated right atrial pressure. No significant change since 07/08/2019.   EKG:   EKG 09/09/2020: Sinus bradycardia at rate of 59 bpm, left atrial enlargement, normal axis, right bundle branch block.  Occasional PACs.  No significant change from EKG 05/21/2019, PVCs (2) not evident.  Assessment     ICD-10-CM   1. Essential hypertension  I10 labetalol (NORMODYNE) 100 MG tablet    ezetimibe (ZETIA) 10 MG tablet  2. Venous insufficiency of both lower extremities  I87.2 Ambulatory referral to Interventional Radiology  3. Bilateral leg edema  R60.0 Ambulatory referral to Interventional Radiology  4. Asymptomatic bilateral carotid artery stenosis  I65.23   5. Aortic root dilatation (HCC)  I77.810    6. Hypercholesteremia  E78.00 ezetimibe (ZETIA) 10 MG tablet   Meds ordered this encounter  Medications  . labetalol (NORMODYNE) 100 MG tablet    Sig: Take 1 tablet (100 mg total) by mouth 2 (two) times daily.    Dispense:  180 tablet    Refill:  3  . ezetimibe (ZETIA) 10 MG tablet    Sig: Take 1 tablet (10 mg total) by mouth daily after supper.    Dispense:  90 tablet    Refill:  3   Medications Discontinued During This Encounter  Medication Reason  . spironolactone (ALDACTONE) 25 MG tablet Change in therapy  . labetalol (NORMODYNE) 200 MG tablet   . 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   He also has controlled diabetes mellitus, hyperlipidemia, hypertension and history of prostate cancer, bilateral asymptomatic carotid artery stenosis and tobacco use disorder in the form of smoking cigarettes.    I had seen him 6 weeks ago and discontinued amlodipine and in spite of this his leg edema is still persistent and I suspect he has venous insufficiency and venous claudication.  I will prescribe him support stockings.  I will also refer him to be evaluated by Dr. GAletta Edouardfor venous insufficiency.  I reviewed the results of the carotid artery duplex and I am very concerned about diffuse nature of the disease and he may not be a candidate for either stenting or surgery in view of diffuse nature and very high risk  of embolic complication.  Smoking cessation was discussed extensively with the patient again.  With regard to hypertension, since addition of labetalol, blood pressure has improved significantly and he feels occasionally dizzy.  As  his blood pressure is soft, I will reduce the dose from 23m to 100 mg twice daily.  He will continue with Benicar HCT.  He will continue with Lasix as well both for hypertension and leg edema on a as needed basis.  I would like to see him back in 6 months with follow-up carotid duplex.   JAdrian Prows MD, FSan Francisco Va Medical Center12/01/2020, 1:54 PM Office: [phone]: [phone]

## 2020-12-14 ENCOUNTER — Other Ambulatory Visit: Payer: Self-pay | Admitting: Cardiology

## 2020-12-14 DIAGNOSIS — I10 Essential (primary) hypertension: Secondary | ICD-10-CM

## 2021-01-17 ENCOUNTER — Other Ambulatory Visit: Payer: Self-pay | Admitting: Cardiology

## 2021-01-17 DIAGNOSIS — I10 Essential (primary) hypertension: Secondary | ICD-10-CM

## 2021-02-13 DIAGNOSIS — E782 Mixed hyperlipidemia: Secondary | ICD-10-CM | POA: Diagnosis not present

## 2021-02-13 DIAGNOSIS — E119 Type 2 diabetes mellitus without complications: Secondary | ICD-10-CM | POA: Diagnosis not present

## 2021-02-13 DIAGNOSIS — Z0001 Encounter for general adult medical examination with abnormal findings: Secondary | ICD-10-CM | POA: Diagnosis not present

## 2021-02-13 DIAGNOSIS — N401 Enlarged prostate with lower urinary tract symptoms: Secondary | ICD-10-CM | POA: Diagnosis not present

## 2021-02-13 DIAGNOSIS — I2510 Atherosclerotic heart disease of native coronary artery without angina pectoris: Secondary | ICD-10-CM | POA: Diagnosis not present

## 2021-02-13 DIAGNOSIS — I10 Essential (primary) hypertension: Secondary | ICD-10-CM | POA: Diagnosis not present

## 2021-02-13 DIAGNOSIS — Z125 Encounter for screening for malignant neoplasm of prostate: Secondary | ICD-10-CM | POA: Diagnosis not present

## 2021-02-13 DIAGNOSIS — Z136 Encounter for screening for cardiovascular disorders: Secondary | ICD-10-CM | POA: Diagnosis not present

## 2021-02-13 DIAGNOSIS — N529 Male erectile dysfunction, unspecified: Secondary | ICD-10-CM | POA: Diagnosis not present

## 2021-02-13 DIAGNOSIS — Z1329 Encounter for screening for other suspected endocrine disorder: Secondary | ICD-10-CM | POA: Diagnosis not present

## 2021-02-14 ENCOUNTER — Other Ambulatory Visit: Payer: Self-pay | Admitting: Cardiology

## 2021-02-14 DIAGNOSIS — I10 Essential (primary) hypertension: Secondary | ICD-10-CM

## 2021-03-25 ENCOUNTER — Other Ambulatory Visit: Payer: Self-pay | Admitting: Cardiology

## 2021-03-25 DIAGNOSIS — I10 Essential (primary) hypertension: Secondary | ICD-10-CM

## 2021-04-20 ENCOUNTER — Ambulatory Visit: Payer: Medicare PPO | Admitting: Cardiology

## 2021-05-12 ENCOUNTER — Encounter: Payer: Self-pay | Admitting: Cardiology

## 2021-05-12 ENCOUNTER — Other Ambulatory Visit: Payer: Self-pay

## 2021-05-12 ENCOUNTER — Ambulatory Visit: Payer: Medicare PPO | Admitting: Cardiology

## 2021-05-12 VITALS — BP 142/64 | HR 75 | Temp 98.8°F | Resp 16 | Ht 72.0 in | Wt 230.8 lb

## 2021-05-12 DIAGNOSIS — I25118 Atherosclerotic heart disease of native coronary artery with other forms of angina pectoris: Secondary | ICD-10-CM | POA: Diagnosis not present

## 2021-05-12 DIAGNOSIS — I10 Essential (primary) hypertension: Secondary | ICD-10-CM | POA: Diagnosis not present

## 2021-05-12 DIAGNOSIS — I872 Venous insufficiency (chronic) (peripheral): Secondary | ICD-10-CM | POA: Diagnosis not present

## 2021-05-12 DIAGNOSIS — I6523 Occlusion and stenosis of bilateral carotid arteries: Secondary | ICD-10-CM

## 2021-05-12 DIAGNOSIS — R600 Localized edema: Secondary | ICD-10-CM | POA: Diagnosis not present

## 2021-05-12 MED ORDER — TORSEMIDE 20 MG PO TABS
20.0000 mg | ORAL_TABLET | Freq: Every day | ORAL | 1 refills | Status: DC
Start: 2021-05-12 — End: 2021-09-01

## 2021-05-12 NOTE — Progress Notes (Signed)
Primary Physician/Referring:  Corey Mccreedy, MD  Patient ID: Corey Beard, male    DOB: [birth year], 75 y.o.   MRN: [id number]  Chief Complaint  Patient presents with   Hypertension   Follow-up    6 months   Leg Swelling   Coronary Artery Disease    HPI: Corey Beard  is a 75 y.o. 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   He also has controlled diabetes mellitus, hyperlipidemia, hypertension and history of prostate cancer, asymptomatic carotid artery stenosis and tobacco use disorder in the form of smoking cigarettes.    This is a 73-monthoffice visit, states that he is miserable with regard to leg edema and leg discomfort and he has doubled up on his Lasix with no relief.  He denies chest pain and dyspnea is stable.  He is extremely busy in taking care of his bedbound ill wife.  Past Medical History:  Diagnosis Date   Arthritis    Glaucoma, both eyes    History of MI (myocardial infarction)    2002   Nocturia    PAF ([place]oxysmal [place]    Prostate cancer ([facility] urologist-- dr eskridge    T1c, Gleason 3+3, PSA 9.83, vol 61cc   S/P drug eluting coronary stent placement    [place]    W[place]    Past Surgical History:  Procedure Laterality Date   CORONARY ANGIOPLASTY  07-01-2001  dr  hTerrence Dupont  PCI to  pLAD, mRCA, ostium D1   CORONARY ANGIOPLASTY  12-04-2001  dr hTerrence Dupont  PCI  to  ostial D1,  pLAD   CORONARY ANGIOPLASTY WITH STENT PLACEMENT  09-03-2002  dr hTerrence Dupont  PCI and DES x1 to pLAD   CYSTOSCOPY WITH BIOPSY  10/04/2015   Procedure: CYSTOSCOPY WITH BIOPSY;  Surgeon: MFestus Aloe MD;  [facility]  Service: Urology;;   INTRAVASCULAR  PRESSURE WIRE/FFR STUDY N/A 07/23/2017   Procedure: INTRAVASCULAR PRESSURE WIRE/FFR STUDY;  Surgeon: GAdrian Prows MD;  [place] [facility] [place];  Service: Cardiovascular;  Laterality: N/A;   LEFT HEART CATH AND CORONARY ANGIOGRAPHY N/A 07/23/2017   Procedure: LEFT HEART CATH AND CORONARY ANGIOGRAPHY;  Surgeon: GAdrian Prows MD;  [place];  Service: Cardiovascular;  Laterality: N/A;   RADIOACTIVE SEED IMPLANT N/A 10/04/2015   Procedure: RADIOACTIVE SEED IMPLANT/BRACHYTHERAPY IMPLANT;  Surgeon: MFestus Aloe MD;  [facility]  Service: Urology;  Laterality: N/A;   REPAIR  INCARCERATED EPIGASTRIC HERNIA  06-04-2002   Social History   Tobacco Use   Smoking status: Some Days    Packs/day: 0.25    Years: 13.00    Pack years: 3.25    Types: Cigarettes   Smokeless tobacco: Never  Substance Use Topics   Alcohol use: No    Alcohol/week: 0.0 standard drinks  Marital Status: Married    Current Outpatient Medications on File Prior to Visit  Medication Sig Dispense Refill   aspirin EC 325 MG tablet Take 325 mg by mouth daily.     atorvastatin (LIPITOR) 20 MG tablet Take 20 mg by mouth daily.     Cholecalciferol (VITAMIN D3) 1000 UNITS CAPS Take 1,000 Units by mouth daily.  ezetimibe (ZETIA) 10 MG tablet Take 1 tablet (10 mg total) by mouth daily after supper. 90 tablet 3   L-ARGININE-500 PO Take 1,000 mg by mouth daily.     labetalol (NORMODYNE) 100 MG tablet Take 1 tablet (100 mg total) by mouth 2 (two) times daily. 180 tablet 3   metFORMIN (GLUCOPHAGE-XR) 500 MG 24 hr tablet Take 500 mg by mouth daily with breakfast.      Multiple Vitamins-Minerals (MULTIVITAMIN PO) Take 1 tablet by mouth daily.     olmesartan-hydrochlorothiazide (BENICAR HCT) 40-25 MG tablet TAKE 1 TABLET BY MOUTH ONCE DAILY IN THE MORNING 30 tablet 0   tamsulosin (FLOMAX) 0.4 MG CAPS capsule Take 0.4 mg by mouth daily.     No current facility-administered medications on  file prior to visit.   Review of Systems  Cardiovascular:  Positive for dyspnea on exertion and leg swelling. Negative for syncope.  Respiratory:  Positive for cough (chronic).   Musculoskeletal:  Positive for gout. Negative for joint swelling.  Gastrointestinal:  Positive for heartburn (helped by Pepcid). Negative for abdominal pain.  Genitourinary:  Positive for bladder incontinence and hesitancy.  Psychiatric/Behavioral:  Negative for depression.   All other systems reviewed and are negative. Objective  Blood pressure (!) 142/64, pulse 75, temperature 98.8 F (37.1 C), temperature source Temporal, resp. rate 16, height 6' (1.829 m), weight 230 lb 12.8 oz (104.7 kg), SpO2 96 %. Body mass index is 31.3 kg/m.   Vitals with BMI 05/12/2021 05/12/2021 10/14/2020  Height - _0  _1   Weight - 230 lbs 13 oz 211 lbs  BMI - 37.3 42.87  Systolic 681 157 262  Diastolic 64 73 52  Pulse 75 80 63     Physical Exam Constitutional:      General: He is not in acute distress.    Appearance: He is well-developed.  HENT:     Head: Atraumatic.  Eyes:     Conjunctiva/sclera: Conjunctivae normal.  Neck:     Thyroid: No thyromegaly.     Vascular: No JVD.  Cardiovascular:     Rate and Rhythm: Normal rate and regular rhythm.     Pulses:          Carotid pulses are  on the right side with bruit and  on the left side with bruit.      Femoral pulses are 2+ on the right side and 2+ on the left side.      Popliteal pulses are 2+ on the right side and 2+ on the left side.       Dorsalis pedis pulses are 2+ on the right side and 1+ on the left side.       Posterior tibial pulses are 2+ on the right side and 1+ on the left side.     Heart sounds: S1 normal and S2 normal. Murmur heard.  Early systolic murmur is present with a grade of 2/6 at the upper right sternal border radiating to the neck.    No gallop.     Comments: 2-3+ bilateral pitting lower extremity edema.  There is no JVD. Pulmonary:      Effort: Pulmonary effort is normal.     Breath sounds: Normal breath sounds.  Abdominal:     General: Bowel sounds are normal.     Palpations: Abdomen is soft.  Musculoskeletal:        General: Normal range of motion.     Cervical back: Neck supple.  Skin:    General:  Skin is warm and dry.  Neurological:     Mental Status: He is alert.   Radiology: No results found.  Laboratory examination:   BMP Latest Ref Rng & Units 10/10/2020 07/10/2019 09/27/2015  Glucose 65 - 99 mg/dL 91 83 85  BUN 8 - 27 mg/dL _0 Creatinine 0.76 - 1.27 mg/dL 1.28(H) 1.24 0.86  BUN/Creat Ratio 10 - _1 -  Sodium 134 - 144 mmol/L 136 139 139  Potassium 3.5 - 5.2 mmol/L 4.1 5.4(H) 4.1  Chloride 96 - 106 mmol/L 97 100 103  CO2 20 - 29 mmol/L _2 Calcium 8.6 - 10.2 mg/dL 9.3 10.1 9.8    Lipid Panel Recent Labs    10/10/20 0810  CHOL 176  TRIG 62  LDLCALC 77  HDL 87   NHDL 87  External labs:  Cholesterol, total 209.000 M 03/24/2020 HDL 104.000 M 03/24/2020 LDL-C 90.000 MG 05/05/2019 Triglycerides 55.000 MG 03/24/2020  A1C 5.500 % 03/24/2020  Hemoglobin 12.400 G/ 05/05/2019  Creatinine, Serum 0.990 MG/ 03/24/2020 Potassium 5.400 mm 07/10/2019 ALT (SGPT) 14.000 IU/ 03/24/2020  Labs 05/05/2019: Total cholesterol 183, triglycerides 59, HDL 81, LDL 90.  Serum glucose 97 mg, BUN 22, creatinine 1.04, eGFR greater than 60 mL, potassium 4.2.  CBC normal.  A1c 6.1%.  TSH normal.  Labs 07/31/2018: Total cholesterol 157, triglycerides 53, HDL 73, LDL 73.  BUN 18, creatinine 1.10.  CMP otherwise normal.  A1c 5.7%.  TSH normal previously.  Cardiac Studies:   Coronary Angiogram  [07/23/2017]: Coronary angiogram 07/23/2017: Moderate disease in the right coronary artery. LAD diffuse disease, proximal to the stent is a 50% stenosis at most 60%, negative FFR. Occluded sandwiched D1 at the LAD stent and diagonal stent. Collaterals present. Mild disease in circumflex and ramus. Medical therapy. H/O  stents 1995 and 2003.  Abdominal aortic duplex 06/27/2017 Normal abdominal aorta. No evidence of abdominal aortic aneurysm. Normal iliac velocity bilateral.  Lexiscan myoview stress test 06/03/2017 1. Patient attempted treadmill and requested to stop. The resting electrocardiogram demonstrated normal sinus rhythm, normal resting conduction, no resting arrhythmias and normal rest repolarization.  Stress EKG is non-diagnostic for ischemia as it a pharmacologic stress using Lexiscan. Stress symptoms included dyspnea and chest pressure. 2. SPECT images demonstrate Medium perfusion abnormality of moderate intensity in the basal inferior, mid inferior and apical inferior myocardial wall(s) on the stress images. The defect is present on the resting images consistent with prior myocardial infarction.   SPECT images demonstrate Medium perfusion abnormality of mild intensity in the mid anterolateral and apical lateral myocardial wall(s) on the stress images.  The defect is not present on the resting images consistent with ischemia.  Gated SPECT imaging demonstrates akinesis of the basal inferior, mid inferior and apical inferior myocardial wall(s).  The left ventricular ejection fraction was calculated or visually estimated to be 37%.  This is a high risk study, consider further cardiac work-up. Compared to Nuclear stress testing 05/06/2002, inferior scar is new.  Carotid artery duplex 09/15/2020: Doppler velocity suggests stenosis in the right internal carotid artery (50-69%), stenosis in the right common carotid artery (<50%) and stenosis in the right external carotid artery (<50%). Doppler velocity suggests stenosis in the left internal carotid artery (50-69%), stenosis in the left common carotid artery (<50%) and stenosis in the left external carotid artery (<50%). In addition the petrous portion of the left ICA appears to have high turbulence and may suggest high grade stenosis. Antegrade right vertebral  artery  flow. Antegrade left vertebral artery flow. Compared to the study done on 07/08/2019, there is progression of disease in bilateral carotid arteries. Previously right 16 to 49% and left carotid bulb less than 50% stenosis. Follow up in six months is appropriate if clinically indicated. Consider different modality evaluation.  Echocardiogram 09/15/2020:  Left ventricle cavity is normal in size. Moderate concentric hypertrophy of the left ventricle. Normal global wall motion. Doppler evidence of grade I (impaired) diastolic dysfunction, normal LAP. Normal LV systolic function with visual EF 55-60%. Calculated EF 50%. Left atrial cavity is severely dilated at 5 cm. Mildly restricted aortic valve leaflets. Trileaflet aortic valve. Trace aortic stenosis. Mild (Grade I) to moderate (Grade II) aortic regurgitation. Peak velocity 2.72ms, Peak Gradient 22.8 mmHg, Mean Gradient 11.2 mmHg, AVA 1.99 cm. Trace mitral stenosis. Moderate (Grade II) mitral regurgitation. Mildly restricted mitral valve leaflets. The aortic root is mildly dilated at 4.2 CM. IVC is dilated with poor inspiration collapse consistent with elevated right atrial pressure. No significant change since 07/08/2019.  EKG:   EKG 05/12/2021: Normal sinus rhythm at rate of 76 bpm, normal axis, incomplete right bundle branch block, anteroseptal infarct old.  PVCs (2).  Compared to 09/09/2020, no significant change.  Assessment     ICD-10-CM   1. Essential hypertension  I10 EKG 12-Lead    2. Coronary artery disease of native artery of native heart with stable angina pectoris ([place]  I25.118     3. Asymptomatic bilateral carotid artery stenosis  I65.23 PCV CAROTID DUPLEX (BILATERAL)    4. Venous insufficiency of both lower extremities  I87.2 Ambulatory referral to Interventional Radiology    5. Bilateral leg edema  R60.0 torsemide (DEMADEX) 20 MG tablet    Ambulatory referral to Interventional Radiology     Meds ordered this  encounter  Medications   torsemide (DEMADEX) 20 MG tablet    Sig: Take 1 tablet (20 mg total) by mouth daily. 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   He also has controlled diabetes mellitus, hyperlipidemia, hypertension and history of prostate cancer, bilateral asymptomatic carotid artery stenosis and tobacco use disorder in the form of smoking cigarettes and chronic leg edema suspected to be due to venous insufficiency.  This is a 627-monthffice visit, states that he is miserable with regard to leg edema and he has doubled up on his Lasix with no relief.  We will discontinue furosemide and switch him to torsemide.  I will refer him back for venous insufficiency and venous claudication study to interventional radiology.  Do not suspect acute decompensated diastolic heart failure to predominant as leg edema is clearly discordant to absence of JVD or hepatomegaly or symptoms of dyspnea.  His dyspnea has been chronic and stable.  He has not had any recurrence of angina pectoris.  Advised him to continue to use support  stockings.  He needs carotid artery duplex for surveillance.  Otherwise from cardiac standpoint, he has remained angina free, no symptoms of claudication, blood pressure is relatively well controlled as well.  I will see him back in 4 to 6 weeks for follow-up.   Adrian Prows, MD, [facility] 05/12/2021,  1:05 PM Office: [phone] Pager: [phone]

## 2021-05-23 DIAGNOSIS — E871 Hypo-osmolality and hyponatremia: Secondary | ICD-10-CM | POA: Diagnosis not present

## 2021-05-23 DIAGNOSIS — R609 Edema, unspecified: Secondary | ICD-10-CM | POA: Diagnosis not present

## 2021-05-23 DIAGNOSIS — N401 Enlarged prostate with lower urinary tract symptoms: Secondary | ICD-10-CM | POA: Diagnosis not present

## 2021-05-23 DIAGNOSIS — E119 Type 2 diabetes mellitus without complications: Secondary | ICD-10-CM | POA: Diagnosis not present

## 2021-05-23 DIAGNOSIS — E782 Mixed hyperlipidemia: Secondary | ICD-10-CM | POA: Diagnosis not present

## 2021-05-23 DIAGNOSIS — I10 Essential (primary) hypertension: Secondary | ICD-10-CM | POA: Diagnosis not present

## 2021-05-23 DIAGNOSIS — I2510 Atherosclerotic heart disease of native coronary artery without angina pectoris: Secondary | ICD-10-CM | POA: Diagnosis not present

## 2021-05-23 DIAGNOSIS — N529 Male erectile dysfunction, unspecified: Secondary | ICD-10-CM | POA: Diagnosis not present

## 2021-05-24 ENCOUNTER — Other Ambulatory Visit: Payer: Self-pay | Admitting: Cardiology

## 2021-05-24 ENCOUNTER — Ambulatory Visit: Payer: Medicare PPO

## 2021-05-24 ENCOUNTER — Other Ambulatory Visit: Payer: Self-pay

## 2021-05-24 DIAGNOSIS — I6523 Occlusion and stenosis of bilateral carotid arteries: Secondary | ICD-10-CM

## 2021-05-24 DIAGNOSIS — I872 Venous insufficiency (chronic) (peripheral): Secondary | ICD-10-CM

## 2021-06-15 ENCOUNTER — Ambulatory Visit
Admission: RE | Admit: 2021-06-15 | Discharge: 2021-06-15 | Disposition: A | Discharge status: Home / Self Care | Payer: Medicare PPO | Source: Ambulatory Visit | Attending: Cardiology | Admitting: Cardiology

## 2021-06-15 ENCOUNTER — Other Ambulatory Visit: Payer: Self-pay

## 2021-06-15 ENCOUNTER — Ambulatory Visit
Admission: RE | Admit: 2021-06-15 | Discharge: 2021-06-15 | Disposition: A | Discharge status: Home / Self Care | Payer: Medicare Other | Source: Ambulatory Visit | Attending: Cardiology | Admitting: Cardiology

## 2021-06-15 DIAGNOSIS — R600 Localized edema: Secondary | ICD-10-CM | POA: Diagnosis not present

## 2021-06-15 DIAGNOSIS — I872 Venous insufficiency (chronic) (peripheral): Secondary | ICD-10-CM

## 2021-06-15 DIAGNOSIS — M7989 Other specified soft tissue disorders: Secondary | ICD-10-CM | POA: Diagnosis not present

## 2021-06-23 ENCOUNTER — Ambulatory Visit: Payer: Medicare PPO | Admitting: Cardiology

## 2021-06-23 ENCOUNTER — Encounter: Payer: Self-pay | Admitting: Cardiology

## 2021-06-23 ENCOUNTER — Other Ambulatory Visit: Payer: Self-pay

## 2021-06-23 VITALS — BP 124/59 | HR 69 | Temp 98.0°F | Resp 16 | Ht 72.0 in | Wt 227.8 lb

## 2021-06-23 DIAGNOSIS — I351 Nonrheumatic aortic (valve) insufficiency: Secondary | ICD-10-CM

## 2021-06-23 DIAGNOSIS — I77810 Thoracic aortic ectasia: Secondary | ICD-10-CM

## 2021-06-23 DIAGNOSIS — R600 Localized edema: Secondary | ICD-10-CM

## 2021-06-23 DIAGNOSIS — I6523 Occlusion and stenosis of bilateral carotid arteries: Secondary | ICD-10-CM | POA: Diagnosis not present

## 2021-06-23 DIAGNOSIS — I10 Essential (primary) hypertension: Secondary | ICD-10-CM | POA: Diagnosis not present

## 2021-06-23 DIAGNOSIS — E7800 Pure hypercholesterolemia, unspecified: Secondary | ICD-10-CM

## 2021-06-23 MED ORDER — OLMESARTAN MEDOXOMIL 40 MG PO TABS: 40.0000 mg | ORAL_TABLET | Freq: Every day | ORAL | 3 refills | Status: DC

## 2021-06-23 MED ORDER — LABETALOL HCL 100 MG PO TABS: 100.0000 mg | ORAL_TABLET | Freq: Two times a day (BID) | ORAL | 3 refills | Status: DC

## 2021-06-23 MED ORDER — EZETIMIBE 10 MG PO TABS: 10.0000 mg | ORAL_TABLET | Freq: Every day | ORAL | 3 refills | Status: AC

## 2021-06-23 MED ORDER — ATORVASTATIN CALCIUM 40 MG PO TABS: 40.0000 mg | ORAL_TABLET | Freq: Every day | ORAL | 3 refills | Status: AC

## 2021-06-23 NOTE — Progress Notes (Signed)
Primary Physician/Referring:  Corey Mccreedy, MD  Patient ID: Corey Beard, male    DOB: 02-Dec-1945, 75 y.o.   MRN: [id number]  Chief Complaint  Patient presents with   Edema   Follow-up    6 weeks    HPI: Corey Beard  is a 75 y.o. 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   He also has controlled diabetes mellitus, hyperlipidemia, hypertension and history of prostate cancer, asymptomatic carotid artery stenosis and tobacco use disorder in the form of smoking cigarettes.    I had seen him 6 weeks ago, he had significant lower extremity edema of 3+.  I suspected he may have venous insufficiency and he underwent venous insufficiency evaluation which was negative.  He also underwent carotid duplex and presents for follow-up.  Continues to have significant edema partially responsive to torsemide.  He is still smoking.  He is extremely busy in taking care of his bedbound ill wife.  Past Medical History:  Diagnosis Date   Arthritis    Glaucoma, both eyes    History of MI (myocardial infarction)    2002   Nocturia    PAF (paroxysmal atrial fibrillation) ([place])    Prostate cancer [facility]) urologist-- dr eskridge    T1c, Gleason 3+3, PSA 9.83, vol 61cc   S/P drug eluting coronary stent placement    Type 2 diabetes mellitus ([place])    Wears glasses    Past Surgical History:  Procedure Laterality Date   CORONARY ANGIOPLASTY  07-01-2001  dr  Terrence Dupont   PCI to  pLAD, mRCA, ostium D1   CORONARY ANGIOPLASTY  12-04-2001  dr Terrence Dupont   PCI  to  ostial D1,  pLAD   CORONARY ANGIOPLASTY WITH STENT PLACEMENT  09-03-2002  dr Terrence Dupont   PCI and DES x1 to pLAD   CYSTOSCOPY WITH BIOPSY  10/04/2015   Procedure: CYSTOSCOPY WITH BIOPSY;  Surgeon: Festus Aloe, MD;  Location: [facility];  Service: Urology;;   INTRAVASCULAR PRESSURE WIRE/FFR STUDY N/A 07/23/2017   Procedure: INTRAVASCULAR PRESSURE WIRE/FFR STUDY;  Surgeon: Adrian Prows, MD;  Location: [facility];  Service: Cardiovascular;  Laterality: N/A;   LEFT HEART CATH AND CORONARY ANGIOGRAPHY N/A 07/23/2017   Procedure: LEFT HEART CATH AND CORONARY ANGIOGRAPHY;  Surgeon: Adrian Prows, MD;  Location: [facility];  Service: Cardiovascular;  Laterality: N/A;   RADIOACTIVE SEED IMPLANT N/A 10/04/2015   Procedure: RADIOACTIVE SEED IMPLANT/BRACHYTHERAPY IMPLANT;  Surgeon: Festus Aloe, MD;  Location: [facility];  Service: Urology;  Laterality: N/A;   REPAIR  INCARCERATED EPIGASTRIC HERNIA  06-04-2002   Social History   Tobacco Use   Smoking status: Some Days    Packs/day: 0.25    Years: 13.00    Pack years: 3.25    Types: Cigarettes   Smokeless tobacco: Never  Substance Use Topics   Alcohol use: No    Alcohol/week: 0.0 standard drinks  Marital Status: Married    Current Outpatient Medications on File Prior to Visit  Medication Sig Dispense Refill   aspirin EC 325 MG tablet Take 325 mg by mouth daily.     Cholecalciferol (VITAMIN D3) 1000 UNITS CAPS Take 1,000 Units by mouth daily.      L-ARGININE-500 PO Take 1,000  mg by mouth daily.     metFORMIN (GLUCOPHAGE-XR) 500 MG 24 hr tablet Take 500 mg by mouth daily with breakfast.      Multiple Vitamins-Minerals (MULTIVITAMIN PO) Take 1 tablet by mouth daily.     tamsulosin (FLOMAX) 0.4 MG CAPS capsule Take 0.4 mg by mouth daily.     torsemide (DEMADEX) 20 MG tablet Take 1 tablet (20 mg total) by mouth daily. Take 1 tab twice daily if leg swelling is worse 180 tablet 1   No current facility-administered medications on file prior to visit.   Review of Systems  Cardiovascular:  Positive for dyspnea on exertion and leg swelling. Negative for syncope.  Respiratory:  Positive for cough  (chronic).   Musculoskeletal:  Positive for gout. Negative for joint swelling.  Gastrointestinal:  Positive for heartburn (helped by Pepcid). Negative for abdominal pain.  Genitourinary:  Positive for bladder incontinence and hesitancy.  Psychiatric/Behavioral:  Negative for depression.   All other systems reviewed and are negative. Objective  Blood pressure (!) 124/59, pulse 69, temperature 98 F (36.7 C), temperature source Temporal, resp. rate 16, height 6' (1.829 m), weight 227 lb 12.8 oz (103.3 kg), SpO2 96 %. Body mass index is 30.9 kg/m.   Vitals with BMI 06/23/2021 06/15/2021 05/12/2021  Height 6' 0" - -  Weight 227 lbs 13 oz - -  BMI 45.85 - -  Systolic 929 244 628  Diastolic 59 80 64  Pulse 69 65 75     Physical Exam Constitutional:      General: He is not in acute distress.    Appearance: He is well-developed.  HENT:     Head: Atraumatic.  Eyes:     Conjunctiva/sclera: Conjunctivae normal.  Neck:     Thyroid: No thyromegaly.     Vascular: Carotid bruit (bilateral) present. No JVD (5 cm).  Cardiovascular:     Rate and Rhythm: Normal rate and regular rhythm.     Pulses:          Femoral pulses are 2+ on the right side and 2+ on the left side.      Popliteal pulses are 2+ on the right side and 2+ on the left side.       Dorsalis pedis pulses are 2+ on the right side and 1+ on the left side.       Posterior tibial pulses are 2+ on the right side and 1+ on the left side.     Heart sounds: S1 normal and S2 normal. Murmur heard.  Early systolic murmur is present with a grade of 2/6 at the upper right sternal border radiating to the neck.    No gallop.  Pulmonary:     Effort: Pulmonary effort is normal.     Breath sounds: Normal breath sounds.  Abdominal:     General: Bowel sounds are normal.     Palpations: Abdomen is soft.  Musculoskeletal:        General: Swelling (2-3+ bilateral pitting lower extremity edema) present. Normal range of motion.     Cervical back: Neck  supple.  Skin:    General: Skin is warm and dry.  Neurological:     Mental Status: He is alert.   Radiology: No results found.  Laboratory examination:   BMP Latest Ref Rng & Units 10/10/2020 07/10/2019 09/27/2015  Glucose 65 - 99 mg/dL 91 83 85  BUN 8 - 27 mg/dL _0 Creatinine 0.76 - 1.27 mg/dL 1.28(H) 1.24 0.86  BUN/Creat  Ratio 10 - _0 -  Sodium 134 - 144 mmol/L 136 139 139  Potassium 3.5 - 5.2 mmol/L 4.1 5.4(H) 4.1  Chloride 96 - 106 mmol/L 97 100 103  CO2 20 - 29 mmol/L _1 Calcium 8.6 - 10.2 mg/dL 9.3 10.1 9.8    Lipid Panel Recent Labs    10/10/20 0810  CHOL 176  TRIG 62  LDLCALC 77  HDL 87   NHDL 87  BNP (last 3 results) No results for input(s): BNP in the last 8760 hours.  ProBNP (last 3 results) No results for input(s): PROBNP in the last 8760 hours.   External labs:  Cholesterol, total 209.000 M 03/24/2020 HDL 104.000 M 03/24/2020 LDL-C 90.000 MG 05/05/2019 Triglycerides 55.000 MG 03/24/2020  A1C 5.500 % 03/24/2020  Hemoglobin 12.400 G/ 05/05/2019  Creatinine, Serum 0.990 MG/ 03/24/2020 Potassium 5.400 mm 07/10/2019 ALT (SGPT) 14.000 IU/ 03/24/2020  Labs 05/05/2019: Total cholesterol 183, triglycerides 59, HDL 81, LDL 90.  Serum glucose 97 mg, BUN 22, creatinine 1.04, eGFR greater than 60 mL, potassium 4.2.  CBC normal.  A1c 6.1%.  TSH normal.  Labs 07/31/2018: Total cholesterol 157, triglycerides 53, HDL 73, LDL 73.  BUN 18, creatinine 1.10.  CMP otherwise normal.  A1c 5.7%.  TSH normal previously.  Cardiac Studies:   Coronary Angiogram  [07/23/2017]: Coronary angiogram 07/23/2017: Moderate disease in the right coronary artery. LAD diffuse disease, proximal to the stent is a 50% stenosis at most 60%, negative FFR. Occluded sandwiched D1 at the LAD stent and diagonal stent. Collaterals present. Mild disease in circumflex and ramus. Medical therapy. H/O stents 1995 and 2003.  Abdominal aortic duplex 06/27/2017 Normal abdominal aorta. No  evidence of abdominal aortic aneurysm. Normal iliac velocity bilateral.  Lexiscan myoview stress test 06/03/2017 1. Patient attempted treadmill and requested to stop. The resting electrocardiogram demonstrated normal sinus rhythm, normal resting conduction, no resting arrhythmias and normal rest repolarization.  Stress EKG is non-diagnostic for ischemia as it a pharmacologic stress using Lexiscan. Stress symptoms included dyspnea and chest pressure. 2. SPECT images demonstrate Medium perfusion abnormality of moderate intensity in the basal inferior, mid inferior and apical inferior myocardial wall(s) on the stress images. The defect is present on the resting images consistent with prior myocardial infarction.   SPECT images demonstrate Medium perfusion abnormality of mild intensity in the mid anterolateral and apical lateral myocardial wall(s) on the stress images.  The defect is not present on the resting images consistent with ischemia.  Gated SPECT imaging demonstrates akinesis of the basal inferior, mid inferior and apical inferior myocardial wall(s).  The left ventricular ejection fraction was calculated or visually estimated to be 37%.  This is a high risk study, consider further cardiac work-up. Compared to Nuclear stress testing 05/06/2002, inferior scar is new.  Carotid artery duplex 09/15/2020: Doppler velocity suggests stenosis in the right internal carotid artery (50-69%), stenosis in the right common carotid artery (<50%) and stenosis in the right external carotid artery (<50%). Doppler velocity suggests stenosis in the left internal carotid artery (50-69%), stenosis in the left common carotid artery (<50%) and stenosis in the left external carotid artery (<50%). In addition the petrous portion of the left ICA appears to have high turbulence and may suggest high grade stenosis. Antegrade right vertebral artery flow. Antegrade left vertebral artery flow. Compared to the study done on 07/08/2019,  there is progression of disease in bilateral carotid arteries. Previously right 16 to 49% and left carotid bulb less than 50%  stenosis. Follow up in six months is appropriate if clinically indicated. Consider different modality evaluation.  Echocardiogram 09/15/2020:  Left ventricle cavity is normal in size. Moderate concentric hypertrophy of the left ventricle. Normal global wall motion. Doppler evidence of grade I (impaired) diastolic dysfunction, normal LAP. Normal LV systolic function with visual EF 55-60%. Calculated EF 50%. Left atrial cavity is severely dilated at 5 cm. Mildly restricted aortic valve leaflets. Trileaflet aortic valve. Trace aortic stenosis. Mild (Grade I) to moderate (Grade II) aortic regurgitation. Peak velocity 2.72ms, Peak Gradient 22.8 mmHg, Mean Gradient 11.2 mmHg, AVA 1.99 cm. Trace mitral stenosis. Moderate (Grade II) mitral regurgitation. Mildly restricted mitral valve leaflets. The aortic root is mildly dilated at 4.2 CM. IVC is dilated with poor inspiration collapse consistent with elevated right atrial pressure. No significant change since 07/08/2019.  Lower Extremity venous insufficiency study 06/15/2021: 1. Patent and normal caliber superficial and deep system lower extremity veins. No evidence of venous insufficiency. 2. Subcutaneous edema is noted throughout the bilateral lower extremities.  EKG:   EKG 05/12/2021: Normal sinus rhythm at rate of 76 bpm, normal axis, incomplete right bundle branch block, anteroseptal infarct old.  PVCs (2).  Compared to 09/09/2020, no significant change.  Assessment     ICD-10-CM   1. Essential hypertension  I10 labetalol (NORMODYNE) 100 MG tablet    olmesartan (BENICAR) 40 MG tablet    2. Bilateral leg edema: Negative venous insufficiency  R60.0 Brain natriuretic peptide    3. Asymptomatic bilateral carotid artery stenosis  I65.23     4. Moderate aortic regurgitation  I35.1 PCV ECHOCARDIOGRAM COMPLETE    5. Aortic  root dilatation (HCC)  I77.810 PCV ECHOCARDIOGRAM COMPLETE    6. Hypercholesteremia  E78.00 ezetimibe (ZETIA) 10 MG tablet    atorvastatin (LIPITOR) 40 MG tablet     Meds ordered this encounter  Medications   labetalol (NORMODYNE) 100 MG tablet    Sig: Take 1 tablet (100 mg total) by mouth 2 (two) times daily.    Dispense:  180 tablet    Refill:  3   ezetimibe (ZETIA) 10 MG tablet    Sig: Take 1 tablet (10 mg total) by mouth daily after supper.    Dispense:  90 tablet    Refill:  3   atorvastatin (LIPITOR) 40 MG tablet    Sig: Take 1 tablet (40 mg total) by mouth daily.    Dispense:  90 tablet    Refill:  3   olmesartan (BENICAR) 40 MG tablet    Sig: Take 1 tablet (40 mg total) by mouth daily.    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   He also has controlled diabetes mellitus, hyperlipidemia, hypertension and history of prostate cancer, bilateral asymptomatic carotid artery stenosis and tobacco use disorder in the form of smoking cigarettes and chronic leg edema  suspected to be due to venous insufficiency.  He presents for 6-week office visit, continues to have significant amount of leg edema.  I reviewed his lower extremity venous duplex, surprisingly he has no venous insufficiency.  Question is whether the leg edema is related to right-sided heart failure  and pulmonary hypertension in view of underlying smoking and COPD and also multivalvular heart disease including moderate AI and moderate MR.  I will obtain repeat echocardiogram and also perform a BNP today.  He may need pulmonary evaluation although dyspnea has remained stable, he does have chronic cough, he has not had a chest x-ray in a while.  He will be in touch with his PCP regarding this.  His lipids are now well controlled on increased dose of Lipitor at 40 mg and also addition of Zetia.  From cardiac standpoint he has not had any angina pectoris.  Carotid stenosis has remained stable.  Depending upon his BNP and also echocardiogram I will make further recommendations and I will be in touch with him personally.  Otherwise I will see him back in 6 months.  Continue torsemide for now.  We will discontinue Benicar HCT and switch to plain Benicar in view of patient being on torsemide.   Adrian Prows, MD, [facility] 06/23/2021, 11:40 AM Office: [phone] Fax: [phone] Pager: [phone]

## 2021-07-04 DIAGNOSIS — R600 Localized edema: Secondary | ICD-10-CM | POA: Diagnosis not present

## 2021-07-05 ENCOUNTER — Ambulatory Visit: Payer: Medicare PPO

## 2021-07-05 ENCOUNTER — Other Ambulatory Visit: Payer: Self-pay

## 2021-07-05 DIAGNOSIS — I351 Nonrheumatic aortic (valve) insufficiency: Secondary | ICD-10-CM | POA: Diagnosis not present

## 2021-07-05 DIAGNOSIS — I77810 Thoracic aortic ectasia: Secondary | ICD-10-CM | POA: Diagnosis not present

## 2021-07-05 LAB — BRAIN NATRIURETIC PEPTIDE: BNP: 48.4 pg/mL (ref 0.0–100.0)

## 2021-07-31 DIAGNOSIS — N401 Enlarged prostate with lower urinary tract symptoms: Secondary | ICD-10-CM | POA: Diagnosis not present

## 2021-07-31 DIAGNOSIS — E782 Mixed hyperlipidemia: Secondary | ICD-10-CM | POA: Diagnosis not present

## 2021-07-31 DIAGNOSIS — I2510 Atherosclerotic heart disease of native coronary artery without angina pectoris: Secondary | ICD-10-CM | POA: Diagnosis not present

## 2021-07-31 DIAGNOSIS — R609 Edema, unspecified: Secondary | ICD-10-CM | POA: Diagnosis not present

## 2021-07-31 DIAGNOSIS — I10 Essential (primary) hypertension: Secondary | ICD-10-CM | POA: Diagnosis not present

## 2021-07-31 DIAGNOSIS — N529 Male erectile dysfunction, unspecified: Secondary | ICD-10-CM | POA: Diagnosis not present

## 2021-07-31 DIAGNOSIS — E119 Type 2 diabetes mellitus without complications: Secondary | ICD-10-CM | POA: Diagnosis not present

## 2021-08-02 DIAGNOSIS — N1832 Chronic kidney disease, stage 3b: Secondary | ICD-10-CM | POA: Diagnosis not present

## 2021-08-02 DIAGNOSIS — K5900 Constipation, unspecified: Secondary | ICD-10-CM | POA: Diagnosis not present

## 2021-08-02 DIAGNOSIS — J90 Pleural effusion, not elsewhere classified: Secondary | ICD-10-CM | POA: Diagnosis not present

## 2021-08-02 DIAGNOSIS — E119 Type 2 diabetes mellitus without complications: Secondary | ICD-10-CM | POA: Diagnosis not present

## 2021-08-02 DIAGNOSIS — Z8546 Personal history of malignant neoplasm of prostate: Secondary | ICD-10-CM | POA: Diagnosis not present

## 2021-08-02 DIAGNOSIS — N179 Acute kidney failure, unspecified: Secondary | ICD-10-CM | POA: Diagnosis not present

## 2021-08-02 DIAGNOSIS — R601 Generalized edema: Secondary | ICD-10-CM | POA: Diagnosis not present

## 2021-08-02 DIAGNOSIS — Z8551 Personal history of malignant neoplasm of bladder: Secondary | ICD-10-CM | POA: Diagnosis not present

## 2021-08-02 DIAGNOSIS — R7989 Other specified abnormal findings of blood chemistry: Secondary | ICD-10-CM | POA: Diagnosis not present

## 2021-08-02 DIAGNOSIS — Z0001 Encounter for general adult medical examination with abnormal findings: Secondary | ICD-10-CM | POA: Diagnosis not present

## 2021-08-02 DIAGNOSIS — R222 Localized swelling, mass and lump, trunk: Secondary | ICD-10-CM | POA: Diagnosis not present

## 2021-08-02 DIAGNOSIS — R0600 Dyspnea, unspecified: Secondary | ICD-10-CM | POA: Diagnosis not present

## 2021-08-05 ENCOUNTER — Telehealth: Payer: Self-pay | Admitting: Cardiology

## 2021-08-05 DIAGNOSIS — R9389 Abnormal findings on diagnostic imaging of other specified body structures: Secondary | ICD-10-CM

## 2021-08-05 DIAGNOSIS — R601 Generalized edema: Secondary | ICD-10-CM

## 2021-08-05 DIAGNOSIS — R935 Abnormal findings on diagnostic imaging of other abdominal regions, including retroperitoneum: Secondary | ICD-10-CM

## 2021-08-05 NOTE — Telephone Encounter (Signed)
ICD-10-CM   1. Anasarca  R60.1     2. Abnormal CT of the chest  R93.89     3. Abnormal CT of the abdomen  R93.5       CT of the abdomen and chest without contrast 08/02/2021: 1. Multifactorial degradation, including lack of oral or IV  contrast, the extent of anasarca edema throughout the thorax,  abdomen, and pelvis.  2. Right larger than left pleural effusions with moderate volume  ascites. Concurrent anasarca, suggesting fluid overload.  3. Coronary artery atherosclerosis. Aortic Atherosclerosis  (ICD10-I70.0).  4. Pulmonary artery enlargement suggests pulmonary arterial  hypertension.  5. Aortic valvular calcifications. Consider echocardiography to  evaluate for valvular dysfunction   After discussions with the PCP, I had recommended that patient obtain CT of the abdomen and chest as I was concerned about SVC syndrome.  He has markedly abnormal CT scan.  He will need thoracentesis for diagnosis followed by probably MRI of the chest to further evaluate any mass.   Adrian Prows, MD, [facility] 08/05/2021, 3:16 PM Office: [phone] Fax: [phone] Pager: [phone]

## 2021-08-07 ENCOUNTER — Other Ambulatory Visit (HOSPITAL_COMMUNITY): Payer: Self-pay | Admitting: Cardiology

## 2021-08-07 ENCOUNTER — Other Ambulatory Visit: Payer: Self-pay | Admitting: Cardiology

## 2021-08-07 DIAGNOSIS — R601 Generalized edema: Secondary | ICD-10-CM

## 2021-08-07 NOTE — Telephone Encounter (Signed)
ICD-10-CM   1. Anasarca  R60.1 IR THORACENTESIS ASP PLEURAL SPACE W/IMG GUIDE    2. Abnormal CT of the chest  R93.89 IR THORACENTESIS ASP PLEURAL SPACE W/IMG GUIDE    3. Abnormal CT of the abdomen  R93.5 IR THORACENTESIS ASP PLEURAL SPACE W/IMG GUIDE     Orders Placed This Encounter  Procedures   IR THORACENTESIS ASP PLEURAL SPACE W/IMG GUIDE    Standing Status:   Future    Standing Expiration Date:   09/06/2021    Order Specific Question:   Are labs required for specimen collection?    Answer:   Yes    Order Specific Question:   Lab orders requested (DO NOT place separate lab orders, these will be automatically ordered during procedure specimen collection):    Answer:   Acid Fast Culture With Reflexed Sensitivities    Order Specific Question:   Lab orders requested (DO NOT place separate lab orders, these will be automatically ordered during procedure specimen collection):    Answer:   Albumin, Body Fluid    Order Specific Question:   Lab orders requested (DO NOT place separate lab orders, these will be automatically ordered during procedure specimen collection):    Answer:   Amylase, Body Fluid    Order Specific Question:   Lab orders requested (DO NOT place separate lab orders, these will be automatically ordered during procedure specimen collection):    Answer:   Amylase, Pleural or Peritoneal Fluid    Order Specific Question:   Lab orders requested (DO NOT place separate lab orders, these will be automatically ordered during procedure specimen collection):    Answer:   Body Fluid Cell Count With Differential    Order Specific Question:   Lab orders requested (DO NOT place separate lab orders, these will be automatically ordered during procedure specimen collection):    Answer:   Comp Panel: Leukemia/Lymphoma    Order Specific Question:   Lab orders requested (DO NOT place separate lab orders, these will be automatically ordered during procedure specimen collection):    Answer:    Cytology - Non Pap    Order Specific Question:   Lab orders requested (DO NOT place separate lab orders, these will be automatically ordered during procedure specimen collection):    Answer:   Protein, Body Fluid    Order Specific Question:   Lab orders requested (DO NOT place separate lab orders, these will be automatically ordered during procedure specimen collection):    Answer:   Protein, Pleural Or Peritoneal Fluid    Order Specific Question:   Reason for Exam (SYMPTOM  OR DIAGNOSIS REQUIRED)    Answer:   Anasarca    Order Specific Question:   Preferred Imaging Location?    Answer:   [facility]    INR  Date Value Ref Range Status  09/27/2015 1.02 0.00 - 1.49 Final

## 2021-08-08 ENCOUNTER — Other Ambulatory Visit: Payer: Self-pay

## 2021-08-08 ENCOUNTER — Ambulatory Visit (HOSPITAL_COMMUNITY)
Admission: RE | Admit: 2021-08-08 | Discharge: 2021-08-08 | Disposition: A | Discharge status: Home / Self Care | Payer: Medicare PPO | Source: Ambulatory Visit | Attending: Cardiology | Admitting: Cardiology

## 2021-08-08 ENCOUNTER — Ambulatory Visit (HOSPITAL_COMMUNITY)
Admission: RE | Admit: 2021-08-08 | Discharge: 2021-08-08 | Disposition: A | Discharge status: Home / Self Care | Payer: Medicare PPO | Source: Ambulatory Visit | Attending: Internal Medicine | Admitting: Internal Medicine

## 2021-08-08 DIAGNOSIS — Z48813 Encounter for surgical aftercare following surgery on the respiratory system: Secondary | ICD-10-CM | POA: Diagnosis not present

## 2021-08-08 DIAGNOSIS — Z9889 Other specified postprocedural states: Secondary | ICD-10-CM

## 2021-08-08 DIAGNOSIS — J90 Pleural effusion, not elsewhere classified: Secondary | ICD-10-CM | POA: Diagnosis not present

## 2021-08-08 DIAGNOSIS — R601 Generalized edema: Secondary | ICD-10-CM | POA: Diagnosis not present

## 2021-08-08 DIAGNOSIS — J948 Other specified pleural conditions: Secondary | ICD-10-CM | POA: Diagnosis not present

## 2021-08-08 HISTORY — PX: IR THORACENTESIS ASP PLEURAL SPACE W/IMG GUIDE: IMG5380

## 2021-08-08 MED ORDER — LIDOCAINE HCL 1 % IJ SOLN
INTRAMUSCULAR | Status: AC
  Filled 2021-08-08: qty 20

## 2021-08-08 MED ORDER — LIDOCAINE HCL (PF) 1 % IJ SOLN
INTRAMUSCULAR | Status: DC | PRN
  Administered 2021-08-08: 10 mL

## 2021-08-08 NOTE — Procedures (Signed)
PROCEDURE SUMMARY:  Successful US guided right thoracentesis. Yielded 2 L of clear, yellow fluid. Pt tolerated procedure well. No immediate complications.  Specimen was sent for labs. CXR ordered.  EBL < 47mL  Tyson Alias, NP 08/08/2021 10:46 AM

## 2021-08-09 LAB — CYTOLOGY - NON PAP

## 2021-08-14 ENCOUNTER — Inpatient Hospital Stay (HOSPITAL_COMMUNITY)
Admission: AD | Admit: 2021-08-14 | Discharge: 2021-09-01 | DRG: 840 | Disposition: A | Discharge status: Home Health | Payer: Medicare PPO | Source: Ambulatory Visit | Attending: Internal Medicine | Admitting: Internal Medicine

## 2021-08-14 ENCOUNTER — Inpatient Hospital Stay (HOSPITAL_COMMUNITY): Payer: Medicare PPO

## 2021-08-14 DIAGNOSIS — R188 Other ascites: Secondary | ICD-10-CM

## 2021-08-14 DIAGNOSIS — N049 Nephrotic syndrome with unspecified morphologic changes: Secondary | ICD-10-CM

## 2021-08-14 DIAGNOSIS — Z8546 Personal history of malignant neoplasm of prostate: Secondary | ICD-10-CM | POA: Diagnosis not present

## 2021-08-14 DIAGNOSIS — E119 Type 2 diabetes mellitus without complications: Secondary | ICD-10-CM | POA: Diagnosis not present

## 2021-08-14 DIAGNOSIS — E8581 Light chain (AL) amyloidosis: Secondary | ICD-10-CM

## 2021-08-14 DIAGNOSIS — N19 Unspecified kidney failure: Secondary | ICD-10-CM | POA: Diagnosis not present

## 2021-08-14 DIAGNOSIS — C9000 Multiple myeloma not having achieved remission: Secondary | ICD-10-CM | POA: Diagnosis not present

## 2021-08-14 DIAGNOSIS — E1129 Type 2 diabetes mellitus with other diabetic kidney complication: Secondary | ICD-10-CM | POA: Diagnosis not present

## 2021-08-14 DIAGNOSIS — H409 Unspecified glaucoma: Secondary | ICD-10-CM | POA: Diagnosis present

## 2021-08-14 DIAGNOSIS — Z6835 Body mass index (BMI) 35.0-35.9, adult: Secondary | ICD-10-CM

## 2021-08-14 DIAGNOSIS — R0600 Dyspnea, unspecified: Secondary | ICD-10-CM

## 2021-08-14 DIAGNOSIS — I6529 Occlusion and stenosis of unspecified carotid artery: Secondary | ICD-10-CM | POA: Diagnosis present

## 2021-08-14 DIAGNOSIS — Z8559 Personal history of malignant neoplasm of other urinary tract organ: Secondary | ICD-10-CM | POA: Diagnosis not present

## 2021-08-14 DIAGNOSIS — Z7982 Long term (current) use of aspirin: Secondary | ICD-10-CM

## 2021-08-14 DIAGNOSIS — K5900 Constipation, unspecified: Secondary | ICD-10-CM | POA: Diagnosis present

## 2021-08-14 DIAGNOSIS — J90 Pleural effusion, not elsewhere classified: Secondary | ICD-10-CM | POA: Diagnosis not present

## 2021-08-14 DIAGNOSIS — E669 Obesity, unspecified: Secondary | ICD-10-CM | POA: Diagnosis present

## 2021-08-14 DIAGNOSIS — Z888 Allergy status to other drugs, medicaments and biological substances status: Secondary | ICD-10-CM

## 2021-08-14 DIAGNOSIS — N189 Chronic kidney disease, unspecified: Secondary | ICD-10-CM | POA: Diagnosis not present

## 2021-08-14 DIAGNOSIS — Z8550 Personal history of malignant neoplasm of unspecified urinary tract organ: Secondary | ICD-10-CM

## 2021-08-14 DIAGNOSIS — I120 Hypertensive chronic kidney disease with stage 5 chronic kidney disease or end stage renal disease: Secondary | ICD-10-CM | POA: Diagnosis present

## 2021-08-14 DIAGNOSIS — Y842 Radiological procedure and radiotherapy as the cause of abnormal reaction of the patient, or of later complication, without mention of misadventure at the time of the procedure: Secondary | ICD-10-CM | POA: Diagnosis present

## 2021-08-14 DIAGNOSIS — J449 Chronic obstructive pulmonary disease, unspecified: Secondary | ICD-10-CM | POA: Diagnosis present

## 2021-08-14 DIAGNOSIS — N08 Glomerular disorders in diseases classified elsewhere: Secondary | ICD-10-CM

## 2021-08-14 DIAGNOSIS — R809 Proteinuria, unspecified: Secondary | ICD-10-CM | POA: Diagnosis not present

## 2021-08-14 DIAGNOSIS — Z992 Dependence on renal dialysis: Secondary | ICD-10-CM | POA: Diagnosis not present

## 2021-08-14 DIAGNOSIS — E8809 Other disorders of plasma-protein metabolism, not elsewhere classified: Secondary | ICD-10-CM | POA: Diagnosis present

## 2021-08-14 DIAGNOSIS — D631 Anemia in chronic kidney disease: Secondary | ICD-10-CM | POA: Diagnosis present

## 2021-08-14 DIAGNOSIS — Z20822 Contact with and (suspected) exposure to covid-19: Secondary | ICD-10-CM | POA: Diagnosis not present

## 2021-08-14 DIAGNOSIS — E785 Hyperlipidemia, unspecified: Secondary | ICD-10-CM | POA: Diagnosis present

## 2021-08-14 DIAGNOSIS — N158 Other specified renal tubulo-interstitial diseases: Secondary | ICD-10-CM | POA: Diagnosis present

## 2021-08-14 DIAGNOSIS — N289 Disorder of kidney and ureter, unspecified: Secondary | ICD-10-CM | POA: Diagnosis not present

## 2021-08-14 DIAGNOSIS — I2510 Atherosclerotic heart disease of native coronary artery without angina pectoris: Secondary | ICD-10-CM | POA: Diagnosis not present

## 2021-08-14 DIAGNOSIS — K7460 Unspecified cirrhosis of liver: Secondary | ICD-10-CM

## 2021-08-14 DIAGNOSIS — C61 Malignant neoplasm of prostate: Secondary | ICD-10-CM

## 2021-08-14 DIAGNOSIS — M1990 Unspecified osteoarthritis, unspecified site: Secondary | ICD-10-CM | POA: Diagnosis present

## 2021-08-14 DIAGNOSIS — D508 Other iron deficiency anemias: Secondary | ICD-10-CM

## 2021-08-14 DIAGNOSIS — N390 Urinary tract infection, site not specified: Secondary | ICD-10-CM | POA: Diagnosis not present

## 2021-08-14 DIAGNOSIS — Z4901 Encounter for fitting and adjustment of extracorporeal dialysis catheter: Secondary | ICD-10-CM | POA: Diagnosis not present

## 2021-08-14 DIAGNOSIS — I351 Nonrheumatic aortic (valve) insufficiency: Secondary | ICD-10-CM | POA: Diagnosis present

## 2021-08-14 DIAGNOSIS — R319 Hematuria, unspecified: Secondary | ICD-10-CM | POA: Diagnosis present

## 2021-08-14 DIAGNOSIS — N186 End stage renal disease: Secondary | ICD-10-CM | POA: Diagnosis present

## 2021-08-14 DIAGNOSIS — D472 Monoclonal gammopathy: Secondary | ICD-10-CM

## 2021-08-14 DIAGNOSIS — I509 Heart failure, unspecified: Secondary | ICD-10-CM | POA: Diagnosis present

## 2021-08-14 DIAGNOSIS — E1122 Type 2 diabetes mellitus with diabetic chronic kidney disease: Secondary | ICD-10-CM | POA: Diagnosis present

## 2021-08-14 DIAGNOSIS — R601 Generalized edema: Principal | ICD-10-CM | POA: Diagnosis present

## 2021-08-14 DIAGNOSIS — R197 Diarrhea, unspecified: Secondary | ICD-10-CM | POA: Diagnosis not present

## 2021-08-14 DIAGNOSIS — I517 Cardiomegaly: Secondary | ICD-10-CM | POA: Diagnosis not present

## 2021-08-14 DIAGNOSIS — Z79899 Other long term (current) drug therapy: Secondary | ICD-10-CM

## 2021-08-14 DIAGNOSIS — N281 Cyst of kidney, acquired: Secondary | ICD-10-CM | POA: Diagnosis present

## 2021-08-14 DIAGNOSIS — Z800 Family history of malignant neoplasm of digestive organs: Secondary | ICD-10-CM

## 2021-08-14 DIAGNOSIS — R946 Abnormal results of thyroid function studies: Secondary | ICD-10-CM | POA: Diagnosis present

## 2021-08-14 DIAGNOSIS — N411 Chronic prostatitis: Secondary | ICD-10-CM | POA: Diagnosis present

## 2021-08-14 DIAGNOSIS — E859 Amyloidosis, unspecified: Secondary | ICD-10-CM | POA: Diagnosis not present

## 2021-08-14 DIAGNOSIS — I10 Essential (primary) hypertension: Secondary | ICD-10-CM | POA: Diagnosis not present

## 2021-08-14 DIAGNOSIS — R609 Edema, unspecified: Secondary | ICD-10-CM | POA: Diagnosis not present

## 2021-08-14 DIAGNOSIS — Z7189 Other specified counseling: Secondary | ICD-10-CM

## 2021-08-14 DIAGNOSIS — N179 Acute kidney failure, unspecified: Secondary | ICD-10-CM | POA: Diagnosis not present

## 2021-08-14 DIAGNOSIS — N400 Enlarged prostate without lower urinary tract symptoms: Secondary | ICD-10-CM | POA: Diagnosis present

## 2021-08-14 DIAGNOSIS — E854 Organ-limited amyloidosis: Secondary | ICD-10-CM | POA: Diagnosis not present

## 2021-08-14 DIAGNOSIS — Z955 Presence of coronary angioplasty implant and graft: Secondary | ICD-10-CM

## 2021-08-14 DIAGNOSIS — I252 Old myocardial infarction: Secondary | ICD-10-CM

## 2021-08-14 DIAGNOSIS — K6389 Other specified diseases of intestine: Secondary | ICD-10-CM | POA: Diagnosis not present

## 2021-08-14 DIAGNOSIS — F17210 Nicotine dependence, cigarettes, uncomplicated: Secondary | ICD-10-CM | POA: Diagnosis present

## 2021-08-14 DIAGNOSIS — R937 Abnormal findings on diagnostic imaging of other parts of musculoskeletal system: Secondary | ICD-10-CM | POA: Diagnosis not present

## 2021-08-14 DIAGNOSIS — E875 Hyperkalemia: Secondary | ICD-10-CM | POA: Diagnosis not present

## 2021-08-14 DIAGNOSIS — I480 Paroxysmal atrial fibrillation: Secondary | ICD-10-CM | POA: Diagnosis present

## 2021-08-14 LAB — COMPREHENSIVE METABOLIC PANEL
ALT: 13 U/L (ref 0–44)
AST: 22 U/L (ref 15–41)
Albumin: 1.7 g/dL — ABNORMAL LOW (ref 3.5–5.0)
Alkaline Phosphatase: 60 U/L (ref 38–126)
Anion gap: 5 (ref 5–15)
BUN: 61 mg/dL — ABNORMAL HIGH (ref 8–23)
CO2: 23 mmol/L (ref 22–32)
Calcium: 7.8 mg/dL — ABNORMAL LOW (ref 8.9–10.3)
Chloride: 106 mmol/L (ref 98–111)
Creatinine, Ser: 3.31 mg/dL — ABNORMAL HIGH (ref 0.61–1.24)
GFR, Estimated: 19 mL/min — ABNORMAL LOW (ref >60)
Glucose, Bld: 83 mg/dL (ref 70–99)
Potassium: 5.2 mmol/L — ABNORMAL HIGH (ref 3.5–5.1)
Sodium: 134 mmol/L — ABNORMAL LOW (ref 135–145)
Total Bilirubin: 0.4 mg/dL (ref 0.3–1.2)
Total Protein: 4.3 g/dL — ABNORMAL LOW (ref 6.5–8.1)

## 2021-08-14 LAB — CBC
HCT: 33.2 % — ABNORMAL LOW (ref 39.0–52.0)
Hemoglobin: 11 g/dL — ABNORMAL LOW (ref 13.0–17.0)
MCH: 28.9 pg (ref 26.0–34.0)
MCHC: 33.1 g/dL (ref 30.0–36.0)
MCV: 87.4 fL (ref 80.0–100.0)
Platelets: 291 10*3/uL (ref 150–400)
RBC: 3.8 MIL/uL — ABNORMAL LOW (ref 4.22–5.81)
RDW: 16.3 % — ABNORMAL HIGH (ref 11.5–15.5)
WBC: 8.2 10*3/uL (ref 4.0–10.5)
nRBC: 0 % (ref 0.0–0.2)

## 2021-08-14 LAB — RESP PANEL BY RT-PCR (FLU A&B, COVID) ARPGX2
Influenza A by PCR: NEGATIVE
Influenza B by PCR: NEGATIVE
SARS Coronavirus 2 by RT PCR: NEGATIVE

## 2021-08-14 LAB — BRAIN NATRIURETIC PEPTIDE: B Natriuretic Peptide: 56.7 pg/mL (ref 0.0–100.0)

## 2021-08-14 LAB — TSH: TSH: 7.659 u[IU]/mL — ABNORMAL HIGH (ref 0.350–4.500)

## 2021-08-14 MED ORDER — SODIUM CHLORIDE 0.9 % IV SOLN: INTRAVENOUS | Status: DC

## 2021-08-14 MED ORDER — SODIUM CHLORIDE 0.9% FLUSH: 3.0000 mL | INTRAVENOUS | Status: DC | PRN

## 2021-08-14 MED ORDER — FUROSEMIDE 10 MG/ML IJ SOLN
20.0000 mg | Freq: Two times a day (BID) | INTRAMUSCULAR | Status: DC
  Administered 2021-08-14 – 2021-08-15 (×2): 20 mg via INTRAVENOUS
  Filled 2021-08-14 (×2): qty 2

## 2021-08-14 MED ORDER — ATORVASTATIN CALCIUM 40 MG PO TABS
40.0000 mg | ORAL_TABLET | Freq: Every day | ORAL | Status: DC
  Administered 2021-08-14 – 2021-09-01 (×19): 40 mg via ORAL
  Filled 2021-08-14 (×19): qty 1

## 2021-08-14 MED ORDER — ASPIRIN EC 325 MG PO TBEC
325.0000 mg | DELAYED_RELEASE_TABLET | Freq: Every day | ORAL | Status: DC
  Administered 2021-08-14 – 2021-08-15 (×2): 325 mg via ORAL
  Filled 2021-08-14 (×3): qty 1

## 2021-08-14 MED ORDER — SODIUM CHLORIDE 0.9 % IV SOLN
250.0000 mL | INTRAVENOUS | Status: DC | PRN
  Administered 2021-08-19 – 2021-08-23 (×4): 250 mL via INTRAVENOUS

## 2021-08-14 MED ORDER — ASPIRIN 81 MG PO CHEW: 81.0000 mg | CHEWABLE_TABLET | ORAL | Status: AC

## 2021-08-14 MED ORDER — ISOSORBIDE MONONITRATE ER 60 MG PO TB24
60.0000 mg | ORAL_TABLET | Freq: Every day | ORAL | Status: DC
  Administered 2021-08-14 – 2021-08-27 (×13): 60 mg via ORAL
  Filled 2021-08-14 (×15): qty 1

## 2021-08-14 MED ORDER — SODIUM CHLORIDE 0.9% FLUSH
3.0000 mL | Freq: Two times a day (BID) | INTRAVENOUS | Status: DC
  Administered 2021-08-14 – 2021-09-01 (×27): 3 mL via INTRAVENOUS

## 2021-08-14 MED ORDER — SODIUM CHLORIDE 0.9% FLUSH
3.0000 mL | Freq: Two times a day (BID) | INTRAVENOUS | Status: DC
  Administered 2021-08-14 – 2021-08-17 (×6): 3 mL via INTRAVENOUS

## 2021-08-14 MED ORDER — ACETAMINOPHEN 325 MG PO TABS
650.0000 mg | ORAL_TABLET | ORAL | Status: DC | PRN
  Administered 2021-08-20 – 2021-08-25 (×6): 650 mg via ORAL
  Filled 2021-08-14 (×6): qty 2

## 2021-08-14 MED ORDER — TAMSULOSIN HCL 0.4 MG PO CAPS
0.4000 mg | ORAL_CAPSULE | Freq: Every day | ORAL | Status: DC
  Administered 2021-08-15 – 2021-09-01 (×18): 0.4 mg via ORAL
  Filled 2021-08-14 (×18): qty 1

## 2021-08-14 MED ORDER — HYDRALAZINE HCL 25 MG PO TABS
25.0000 mg | ORAL_TABLET | Freq: Three times a day (TID) | ORAL | Status: DC
  Administered 2021-08-14 – 2021-08-27 (×34): 25 mg via ORAL
  Filled 2021-08-14 (×37): qty 1

## 2021-08-14 MED ORDER — ONDANSETRON HCL 4 MG/2ML IJ SOLN
4.0000 mg | Freq: Four times a day (QID) | INTRAMUSCULAR | Status: DC | PRN
  Administered 2021-08-28 – 2021-08-29 (×2): 4 mg via INTRAVENOUS
  Filled 2021-08-14 (×2): qty 2

## 2021-08-14 MED ORDER — HEPARIN SODIUM (PORCINE) 5000 UNIT/ML IJ SOLN
5000.0000 [IU] | Freq: Three times a day (TID) | INTRAMUSCULAR | Status: DC
  Administered 2021-08-14 – 2021-08-15 (×2): 5000 [IU] via SUBCUTANEOUS
  Filled 2021-08-14 (×2): qty 1

## 2021-08-14 MED ORDER — EZETIMIBE 10 MG PO TABS
10.0000 mg | ORAL_TABLET | Freq: Every day | ORAL | Status: DC
  Administered 2021-08-15 – 2021-08-31 (×17): 10 mg via ORAL
  Filled 2021-08-14 (×16): qty 1

## 2021-08-14 MED ORDER — SODIUM CHLORIDE 0.9 % IV SOLN
250.0000 mL | INTRAVENOUS | Status: DC | PRN
  Administered 2021-08-18 (×2): 250 mL via INTRAVENOUS

## 2021-08-14 MED ORDER — LABETALOL HCL 100 MG PO TABS
100.0000 mg | ORAL_TABLET | Freq: Two times a day (BID) | ORAL | Status: DC
  Administered 2021-08-14 – 2021-08-20 (×12): 100 mg via ORAL
  Filled 2021-08-14 (×12): qty 1

## 2021-08-15 ENCOUNTER — Encounter (HOSPITAL_COMMUNITY)
Admission: AD | Disposition: A | Discharge status: Home Health | Payer: Self-pay | Source: Ambulatory Visit | Attending: Internal Medicine

## 2021-08-15 ENCOUNTER — Inpatient Hospital Stay (HOSPITAL_COMMUNITY): Payer: Medicare PPO

## 2021-08-15 ENCOUNTER — Encounter (HOSPITAL_COMMUNITY): Payer: Self-pay | Admitting: Cardiology

## 2021-08-15 DIAGNOSIS — N049 Nephrotic syndrome with unspecified morphologic changes: Secondary | ICD-10-CM | POA: Diagnosis not present

## 2021-08-15 DIAGNOSIS — E785 Hyperlipidemia, unspecified: Secondary | ICD-10-CM | POA: Diagnosis present

## 2021-08-15 LAB — PROTEIN / CREATININE RATIO, URINE
Creatinine, Urine: 191.76 mg/dL
Protein Creatinine Ratio: 1.65 mg/mg{Cre} — ABNORMAL HIGH (ref 0.00–0.15)
Total Protein, Urine: 317 mg/dL

## 2021-08-15 LAB — BASIC METABOLIC PANEL
Anion gap: 7 (ref 5–15)
BUN: 61 mg/dL — ABNORMAL HIGH (ref 8–23)
CO2: 23 mmol/L (ref 22–32)
Calcium: 7.6 mg/dL — ABNORMAL LOW (ref 8.9–10.3)
Chloride: 105 mmol/L (ref 98–111)
Creatinine, Ser: 3.17 mg/dL — ABNORMAL HIGH (ref 0.61–1.24)
GFR, Estimated: 20 mL/min — ABNORMAL LOW (ref >60)
Glucose, Bld: 88 mg/dL (ref 70–99)
Potassium: 4.7 mmol/L (ref 3.5–5.1)
Sodium: 135 mmol/L (ref 135–145)

## 2021-08-15 LAB — URINALYSIS, COMPLETE (UACMP) WITH MICROSCOPIC
Bilirubin Urine: NEGATIVE
Glucose, UA: NEGATIVE mg/dL
Ketones, ur: NEGATIVE mg/dL
Nitrite: NEGATIVE
Protein, ur: >=300 mg/dL — AB
Specific Gravity, Urine: 1.013 (ref 1.005–1.030)
WBC, UA: >50 WBC/hpf — ABNORMAL HIGH (ref 0–5)
pH: 5 (ref 5.0–8.0)

## 2021-08-15 LAB — GLUCOSE, CAPILLARY: Glucose-Capillary: 332 mg/dL — ABNORMAL HIGH (ref 70–99)

## 2021-08-15 LAB — BRAIN NATRIURETIC PEPTIDE: B Natriuretic Peptide: 52.4 pg/mL (ref 0.0–100.0)

## 2021-08-15 SURGERY — RIGHT HEART CATH

## 2021-08-15 MED ORDER — HEPARIN SODIUM (PORCINE) 5000 UNIT/ML IJ SOLN: 5000.0000 [IU] | Freq: Three times a day (TID) | INTRAMUSCULAR | Status: DC

## 2021-08-15 MED ORDER — SODIUM CHLORIDE 0.9 % IV SOLN: INTRAVENOUS | Status: DC

## 2021-08-15 MED ORDER — FUROSEMIDE 10 MG/ML IJ SOLN: 80.0000 mg | Freq: Four times a day (QID) | INTRAMUSCULAR | Status: DC

## 2021-08-15 MED ORDER — FUROSEMIDE 10 MG/ML IJ SOLN
60.0000 mg | Freq: Four times a day (QID) | INTRAMUSCULAR | Status: DC
  Administered 2021-08-15 – 2021-08-16 (×3): 60 mg via INTRAVENOUS
  Filled 2021-08-15 (×4): qty 6

## 2021-08-15 NOTE — Consult Note (Signed)
[facility]  INPATIENT CONSULTATION  Reason for Consultation: AKI, anasarca Requesting Provider: Dr. Lorin Mercy  HPI: Corey Beard is an 75 y.o. male from [place], type 2 DM, h/o prostate ca, tobacco use, carotid stenosis, nonobstructive CAD who is seen for evaluation and management of AKI and anasarca.   Recently developed progressive edema and dyspnea requiring BL pleural effusion requiring 2L thoracentesis on 08/07/21.     He was admitted by his cardiologist for heart failure but proBNP is normal at echo inconsistent with his degree of fluid retention.  Nephrology is now involved given AKI and concern for nephrotic syndrome. His creatinine as of 32mo ago was 1.2-1.3mg /dL but recently has trended up to to 3.3 with albumin of 1.7.  CBC normal except Hb 11s.  UA this admission with 3+ protein, +blood and +WBC.    He notes recent foamy urine and 30lb wt gain over 1 month.  No difficulty with voiding.  Has h/o chronic prostatitis after radioactive bead implant for prostate ca - currently asymptomatic.   He intermittently takes NSAID dose but not heavy use and fairly infrequently.  Having some constipation recently and chronic "smoker's cough."    Daughter is bedside; she is in clinical trials.  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   PMH: Past Medical History:  Diagnosis Date   Arthritis    Glaucoma, both eyes    History of MI (myocardial infarction)    2002   Nocturia    PAF (paroxysmal atrial fibrillation) ([place])    Prostate cancer [facility]) urologist-- dr eskridge    T1c, Gleason 3+3, PSA 9.83, vol 61cc   S/P drug eluting coronary stent placement    Type 2 diabetes mellitus ([place])    Wears glasses    PSH: Past Surgical History:  Procedure Laterality Date   CORONARY ANGIOPLASTY  07-01-2001  dr  Terrence Dupont   PCI to  pLAD, mRCA, ostium D1   CORONARY ANGIOPLASTY  12-04-2001  dr Terrence Dupont   PCI  to  ostial D1,  pLAD   CORONARY ANGIOPLASTY WITH STENT PLACEMENT  09-03-2002  dr Terrence Dupont   PCI  and DES x1 to pLAD   CYSTOSCOPY WITH BIOPSY  10/04/2015   Procedure: CYSTOSCOPY WITH BIOPSY;  Surgeon: Festus Aloe, MD;  Location: [facility];  Service: Urology;;   INTRAVASCULAR PRESSURE WIRE/FFR STUDY N/A 07/23/2017   Procedure: INTRAVASCULAR PRESSURE WIRE/FFR STUDY;  Surgeon: Adrian Prows, MD;  Location: [place] [facility];  Service: Cardiovascular;  Laterality: N/A;   IR THORACENTESIS ASP PLEURAL SPACE W/IMG GUIDE  08/08/2021   LEFT HEART CATH AND CORONARY ANGIOGRAPHY N/A 07/23/2017   Procedure: LEFT HEART CATH AND CORONARY ANGIOGRAPHY;  Surgeon: Adrian Prows, MD;  Location: [place] [facility];  Service: Cardiovascular;  Laterality: N/A;   RADIOACTIVE SEED IMPLANT N/A 10/04/2015   Procedure: RADIOACTIVE SEED IMPLANT/BRACHYTHERAPY IMPLANT;  Surgeon: Festus Aloe, MD;  Location: [facility];  Service: Urology;  Laterality: N/A;   REPAIR  INCARCERATED EPIGASTRIC HERNIA  06-04-2002    Past Medical History:  Diagnosis Date   Arthritis    Glaucoma, both eyes    History of MI (myocardial infarction)    2002   Nocturia    PAF (paroxysmal atrial fibrillation) ([place])    Prostate cancer [facility]) urologist-- dr eskridge    T1c, Gleason 3+3, PSA 9.83, vol 61cc   S/P drug eluting coronary stent placement    Type 2 diabetes mellitus ([place])    Wears glasses     Medications:  I have reviewed the patient's current medications.   Medications Prior to Admission  Medication Sig Dispense Refill   aspirin EC 325 MG tablet Take 325 mg by mouth daily.     atorvastatin (LIPITOR) 40 MG tablet Take 1 tablet (40 mg total) by mouth daily. 90 tablet 3   ezetimibe (ZETIA) 10 MG tablet Take 1 tablet (10 mg total) by mouth daily after supper. 90 tablet 3   L-ARGININE-500 PO Take 1,000 mg by mouth daily.     labetalol (NORMODYNE) 100 MG tablet Take 1 tablet (100 mg total) by mouth 2 (two) times daily. 180 tablet 3   Multiple Vitamins-Minerals (MULTIVITAMIN PO) Take 1 tablet by  mouth daily.     olmesartan (BENICAR) 40 MG tablet Take 1 tablet (40 mg total) by mouth daily. 90 tablet 3   potassium chloride (KLOR-CON) 10 MEQ tablet Take 10 mEq by mouth daily.     tamsulosin (FLOMAX) 0.4 MG CAPS capsule Take 0.4 mg by mouth daily.     torsemide (DEMADEX) 20 MG tablet Take 1 tablet (20 mg total) by mouth daily. Take 1 tab twice daily if leg swelling is worse (Patient taking differently: Take 20 mg by mouth 2 (two) times daily.) 180 tablet 1    ALLERGIES:   Allergies  Allergen Reactions   Amlodipine Swelling    FAM HX: Family History  Problem Relation Age of Onset   Colon cancer Mother     Social History:   reports that he has been smoking cigarettes. He has a 20.00 pack-year smoking history. He has never used smokeless tobacco. He reports current alcohol use. He reports that he does not use drugs.  ROS: 12 system ROS neg except per HPI above   Blood pressure (!) 101/54, pulse 74, temperature 98.2 F (36.8 C), temperature source Oral, resp. rate 20, height 6\' 1"  (1.854 m), weight 118.6 kg, SpO2 94 %. PHYSICAL EXAM: Gen: comfortable in bed  Eyes: anicteric, EOMI, +glasses ENT: MMM, no oral ulcers Neck: supple CV:  RRR, no rub Abd: soft, +abd wall edema, mod distended, nontender Lungs: normal WOB, rhonchi scattered L field, R fairly clear GU: no foley Extr:  2-3+ pitting edema throughout Neuro: nonfocal Skin: no rashes, has some excoriations and cracks without weeping on RLE   Results for orders placed or performed during the hospital encounter of 08/14/21 (from the past 48 hour(s))  Resp Panel by RT-PCR (Flu A&B, Covid) Nasopharyngeal Swab     Status: None   Collection Time: 08/14/21  7:57 PM   Specimen: Nasopharyngeal Swab; Nasopharyngeal(NP) swabs in vial transport medium  Result Value Ref Range   SARS Coronavirus 2 by RT PCR NEGATIVE NEGATIVE    Comment: (NOTE) SARS-CoV-2 target nucleic acids are NOT DETECTED.  The SARS-CoV-2 RNA is generally  detectable in upper respiratory specimens during the acute phase of infection. The lowest concentration of SARS-CoV-2 viral copies this assay can detect is 138 copies/mL. A negative result does not preclude SARS-Cov-2 infection and should not be used as the sole basis for treatment or other patient management decisions. A negative result may occur with  improper specimen collection/handling, submission of specimen other than nasopharyngeal swab, presence of viral mutation(s) within the areas targeted by this assay, and inadequate number of viral copies(<138 copies/mL). A negative result must be combined with clinical observations, patient history, and epidemiological information. The expected result is Negative.  Fact Sheet for Patients:  EntrepreneurPulse.com.au  Fact Sheet for Healthcare Providers:  IncredibleEmployment.be  This test  is no t yet approved or cleared by the [place] and  has been authorized for detection and/or diagnosis of SARS-CoV-2 by FDA under an Emergency Use Authorization (EUA). This EUA will remain  in effect (meaning this test can be used) for the duration of the COVID-19 declaration under Section 564(b)(1) of the Act, 21 U.S.C.section 360bbb-3(b)(1), unless the authorization is terminated  or revoked sooner.       Influenza A by PCR NEGATIVE NEGATIVE   Influenza B by PCR NEGATIVE NEGATIVE    Comment: (NOTE) The Xpert Xpress SARS-CoV-2/FLU/RSV plus assay is intended as an aid in the diagnosis of influenza from Nasopharyngeal swab specimens and should not be used as a sole basis for treatment. Nasal washings and aspirates are unacceptable for Xpert Xpress SARS-CoV-2/FLU/RSV testing.  Fact Sheet for Patients: EntrepreneurPulse.com.au  Fact Sheet for Healthcare Providers: IncredibleEmployment.be  This test is not yet approved or cleared by the [place] FDA and has  been authorized for detection and/or diagnosis of SARS-CoV-2 by FDA under an Emergency Use Authorization (EUA). This EUA will remain in effect (meaning this test can be used) for the duration of the COVID-19 declaration under Section 564(b)(1) of the Act, 21 U.S.C. section 360bbb-3(b)(1), unless the authorization is terminated or revoked.  Performed at [facility], [place] [address] [place], [place] [address]   CBC     Status: Abnormal   Collection Time: 08/14/21  9:18 PM  Result Value Ref Range   WBC 8.2 4.0 - 10.5 K/uL   RBC 3.80 (L) 4.22 - 5.81 MIL/uL   Hemoglobin 11.0 (L) 13.0 - 17.0 g/dL   HCT 33.2 (L) 39.0 - 52.0 %   MCV 87.4 80.0 - 100.0 fL   MCH 28.9 26.0 - 34.0 pg   MCHC 33.1 30.0 - 36.0 g/dL   RDW 16.3 (H) 11.5 - 15.5 %   Platelets 291 150 - 400 K/uL   nRBC 0.0 0.0 - 0.2 %    Comment: Performed at [facility] [address] [place], [place] [address]  Comprehensive metabolic panel     Status: Abnormal   Collection Time: 08/14/21  9:18 PM  Result Value Ref Range   Sodium 134 (L) 135 - 145 mmol/L   Potassium 5.2 (H) 3.5 - 5.1 mmol/L   Chloride 106 98 - 111 mmol/L   CO2 23 22 - 32 mmol/L   Glucose, Bld 83 70 - 99 mg/dL    Comment: Glucose reference range applies only to samples taken after fasting for at least 8 hours.   BUN 61 (H) 8 - 23 mg/dL   Creatinine, Ser 3.31 (H) 0.61 - 1.24 mg/dL   Calcium 7.8 (L) 8.9 - 10.3 mg/dL   Total Protein 4.3 (L) 6.5 - 8.1 g/dL   Albumin 1.7 (L) 3.5 - 5.0 g/dL   AST 22 15 - 41 U/L   ALT 13 0 - 44 U/L   Alkaline Phosphatase 60 38 - 126 U/L   Total Bilirubin 0.4 0.3 - 1.2 mg/dL   GFR, Estimated 19 (L) >60 mL/min    Comment: (NOTE) Calculated using the CKD-EPI Creatinine Equation (2021)    Anion gap 5 5 - 15    Comment: Performed at [facility], [place] [address] [place], [place] [address]  Brain natriuretic peptide     Status: None   Collection Time: 08/14/21  9:18 PM  Result Value Ref Range   B Natriuretic  Peptide 56.7 0.0 - 100.0  pg/mL    Comment: Performed at [facility], [place] [address] [place], [place] [address]  TSH     Status: Abnormal   Collection Time: 08/14/21  9:18 PM  Result Value Ref Range   TSH 7.659 (H) 0.350 - 4.500 uIU/mL    Comment: Performed by a 3rd Generation assay with a functional sensitivity of <=0.01 uIU/mL. Performed at [facility], [place] [address] [place], [place] [address]   Basic metabolic panel     Status: Abnormal   Collection Time: 08/15/21  2:43 AM  Result Value Ref Range   Sodium 135 135 - 145 mmol/L   Potassium 4.7 3.5 - 5.1 mmol/L   Chloride 105 98 - 111 mmol/L   CO2 23 22 - 32 mmol/L   Glucose, Bld 88 70 - 99 mg/dL    Comment: Glucose reference range applies only to samples taken after fasting for at least 8 hours.   BUN 61 (H) 8 - 23 mg/dL   Creatinine, Ser 3.17 (H) 0.61 - 1.24 mg/dL   Calcium 7.6 (L) 8.9 - 10.3 mg/dL   GFR, Estimated 20 (L) >60 mL/min    Comment: (NOTE) Calculated using the CKD-EPI Creatinine Equation (2021)    Anion gap 7 5 - 15    Comment: Performed at [facility] [address], [place], [place] [address]  Brain natriuretic peptide     Status: None   Collection Time: 08/15/21  2:43 AM  Result Value Ref Range   B Natriuretic Peptide 52.4 0.0 - 100.0 pg/mL    Comment: Performed at [facility] [address] [place], [place] [address]  Urinalysis, Complete w Microscopic Urine, Random     Status: Abnormal   Collection Time: 08/15/21  7:56 AM  Result Value Ref Range   Color, Urine YELLOW YELLOW   APPearance CLOUDY (A) CLEAR   Specific Gravity, Urine 1.013 1.005 - 1.030   pH 5.0 5.0 - 8.0   Glucose, UA NEGATIVE NEGATIVE mg/dL   Hgb urine dipstick SMALL (A) NEGATIVE   Bilirubin Urine NEGATIVE NEGATIVE   Ketones, ur NEGATIVE NEGATIVE mg/dL   Protein, ur >=300 (A) NEGATIVE mg/dL   Nitrite NEGATIVE NEGATIVE   Leukocytes,Ua LARGE (A) NEGATIVE   RBC / HPF 6-10 0 - 5 RBC/hpf   WBC, UA >50 (H) 0 -  5 WBC/hpf   Bacteria, UA MANY (A) NONE SEEN   WBC Clumps PRESENT    Mucus PRESENT    Hyaline Casts, UA PRESENT    Granular Casts, UA PRESENT     Comment: Performed at [facility] [address], [place], [place] [address]    DG Chest 2 View  Result Date: 08/14/2021 CLINICAL DATA:  Dyspnea. EXAM: CHEST - 2 VIEW COMPARISON:  Chest x-ray 08/08/2021. FINDINGS: Heart is enlarged, unchanged. There are atherosclerotic calcifications of the aorta. There are small bilateral pleural effusions, increasing from prior study. There is no evidence for pneumothorax. There some strandy opacities at the lung bases favored is atelectasis. No acute fractures are seen. IMPRESSION: 1. Increasing small bilateral pleural effusions. 2. Stable cardiomegaly. Electronically Signed   By: Ronney Asters M.D.   On: 08/14/2021 21:15    Assessment/Plan **AKI:  clinical picture most concerning for nephrotic syndrome with AKI.  Initiate full eval with renal US, renal vein dopplers, serologies, IR US guided renal biopsy.     **Proteinuria:  3+ on UA, quantify but suspect it'll be nephrotic range.  Holding  ARB in setting of AKI for now.  W/u per above.  **Anasarca:  low na diet.  Diurese with IV furosemide 60q6h for now.  Strict I/Os, daily weights.  Eval underlying issue per above.   **DM:  per primary.    **h/o prostatitis:  asymptomatic currently but UA suggestive of infection - send culture.  I don't think this should preclude kidney biopsy.   Will follow closely, call with concerns.   Justin Mend 08/15/2021, 11:52 AM

## 2021-08-15 NOTE — Consult Note (Deleted)
CARDIOLOGY CONSULT NOTE  Patient ID: Corey Beard MRN: [id number] DOB/AGE: 06/29/1946 75 y.o.  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 He is found to have bilateral pleural effusions R>L, with recent 2 L thoracentesis performed on 08/07/2021. Cr is up to 3.3 from baseline of 1.2-1.3 a year ago. He has 3+ proteinuria. Physical exam shows anasarca, JVD, diminished b/l breath sounds at bases.   Admission was requested by PCP Dr. Jacquelin Hawking to Dr. Einar Gip with possibility of heart failure and potential plans for RHC. However, recent echocardiogram is incongruent with the degree of anasarca patient has. BNP is completely normal.  Past Medical History:  Diagnosis Date   Arthritis    Glaucoma, both eyes    History of MI (myocardial infarction)    2002   Nocturia    PAF (paroxysmal atrial fibrillation) ([place])    Prostate cancer [facility]) urologist-- dr eskridge    T1c, Gleason 3+3, PSA 9.83, vol 61cc   S/P drug eluting coronary stent placement    Type 2 diabetes mellitus ([place])    Wears glasses      Past Surgical History:  Procedure Laterality Date   CORONARY ANGIOPLASTY  07-01-2001  dr  Terrence Dupont   PCI to  pLAD, mRCA, ostium D1   CORONARY ANGIOPLASTY  12-04-2001  dr Terrence Dupont   PCI  to  ostial D1,  pLAD   CORONARY ANGIOPLASTY WITH STENT PLACEMENT  09-03-2002  dr Terrence Dupont   PCI and DES x1 to pLAD   CYSTOSCOPY WITH BIOPSY  10/04/2015   Procedure: CYSTOSCOPY WITH BIOPSY;  Surgeon: Festus Aloe, MD;  Location: [facility];  Service: Urology;;   INTRAVASCULAR PRESSURE WIRE/FFR STUDY N/A 07/23/2017   Procedure: INTRAVASCULAR PRESSURE WIRE/FFR STUDY;  Surgeon: Adrian Prows, MD;  Location: [place] [facility];  Service:  Cardiovascular;  Laterality: N/A;   IR THORACENTESIS ASP PLEURAL SPACE W/IMG GUIDE  08/08/2021   LEFT HEART CATH AND CORONARY ANGIOGRAPHY N/A 07/23/2017   Procedure: LEFT HEART CATH AND CORONARY ANGIOGRAPHY;  Surgeon: Adrian Prows, MD;  Location: [place] [facility];  Service: Cardiovascular;  Laterality: N/A;   RADIOACTIVE SEED IMPLANT N/A 10/04/2015   Procedure: RADIOACTIVE SEED IMPLANT/BRACHYTHERAPY IMPLANT;  Surgeon: Festus Aloe, MD;  Location: [facility];  Service: Urology;  Laterality: N/A;   REPAIR  INCARCERATED EPIGASTRIC HERNIA  06-04-2002      Family History  Problem Relation Age of Onset   Colon cancer Mother      Social History: Social History   Socioeconomic History   Marital status: Married    Spouse name: Not on file   Number of children: 2   Years of education: Not on file   Highest education level: Not on file  Occupational History   Occupation: Physician  Tobacco Use   Smoking status: Some Days    Packs/day: 0.25    Years: 13.00    Pack years: 3.25    Types: Cigarettes   Smokeless tobacco: Never  Vaping Use   Vaping Use: Never used  Substance and Sexual Activity   Alcohol use: No    Alcohol/week: 0.0 standard drinks   Drug use: No   Sexual activity: Not on file  Other Topics Concern   Not on file  Social History Narrative   Not  on file   Social Determinants of Health   Financial Resource Strain: Not on file  Food Insecurity: Not on file  Transportation Needs: Not on file  Physical Activity: Not on file  Stress: Not on file  Social Connections: Not on file  Intimate Partner Violence: Not on file     Medications Prior to Admission  Medication Sig Dispense Refill Last Dose   aspirin EC 325 MG tablet Take 325 mg by mouth daily.   08/13/2021 at 9 pm   atorvastatin (LIPITOR) 40 MG tablet Take 1 tablet (40 mg total) by mouth daily. 90 tablet 3 08/13/2021   ezetimibe (ZETIA) 10 MG tablet Take 1 tablet (10 mg total) by mouth daily  after supper. 90 tablet 3 08/13/2021   L-ARGININE-500 PO Take 1,000 mg by mouth daily.   08/13/2021   labetalol (NORMODYNE) 100 MG tablet Take 1 tablet (100 mg total) by mouth 2 (two) times daily. 180 tablet 3 08/14/2021 at 9 am   Multiple Vitamins-Minerals (MULTIVITAMIN PO) Take 1 tablet by mouth daily.   08/13/2021   olmesartan (BENICAR) 40 MG tablet Take 1 tablet (40 mg total) by mouth daily. 90 tablet 3 08/14/2021   potassium chloride (KLOR-CON) 10 MEQ tablet Take 10 mEq by mouth daily.   08/14/2021   tamsulosin (FLOMAX) 0.4 MG CAPS capsule Take 0.4 mg by mouth daily.   08/14/2021   torsemide (DEMADEX) 20 MG tablet Take 1 tablet (20 mg total) by mouth daily. Take 1 tab twice daily if leg swelling is worse (Patient taking differently: Take 20 mg by mouth 2 (two) times daily.) 180 tablet 1 08/14/2021    Review of Systems  Constitutional: Positive for weight gain. Negative for decreased appetite, malaise/fatigue and weight loss.  HENT:  Negative for congestion.   Eyes:  Negative for visual disturbance.  Cardiovascular:  Positive for dyspnea on exertion and leg swelling. Negative for chest pain, palpitations and syncope.  Respiratory:  Negative for cough.   Endocrine: Negative for cold intolerance.  Hematologic/Lymphatic: Does not bruise/bleed easily.  Skin:  Negative for itching and rash.  Musculoskeletal:  Negative for myalgias.  Gastrointestinal:  Negative for abdominal pain, nausea and vomiting.  Genitourinary:  Negative for dysuria.  Neurological:  Negative for dizziness and weakness.  Psychiatric/Behavioral:  The patient is not nervous/anxious.   All other systems reviewed and are negative.    Physical Exam: Physical Exam Vitals and nursing note reviewed.  Constitutional:      General: He is not in acute distress.    Appearance: He is well-developed. He is ill-appearing.     Comments: Anasarca  HENT:     Head: Normocephalic and atraumatic.  Eyes:     Conjunctiva/sclera:  Conjunctivae normal.     Pupils: Pupils are equal, round, and reactive to light.  Neck:     Vascular: JVD present.  Cardiovascular:     Rate and Rhythm: Normal rate and regular rhythm.     Pulses: Normal pulses and intact distal pulses.     Heart sounds: No murmur heard. Pulmonary:     Effort: Pulmonary effort is normal.     Breath sounds: Normal breath sounds. No wheezing or rales.  Abdominal:     General: Bowel sounds are normal.     Palpations: Abdomen is soft.     Tenderness: There is no rebound.  Musculoskeletal:        General: No tenderness. Normal range of motion.     Right lower leg: Edema (3+) present.  Left lower leg: Edema (3+) present.  Lymphadenopathy:     Cervical: No cervical adenopathy.  Skin:    General: Skin is warm and dry.  Neurological:     Mental Status: He is alert and oriented to person, place, and time.     Cranial Nerves: No cranial nerve deficit.     Labs:   Lab Results  Component Value Date   WBC 8.2 08/14/2021   HGB 11.0 (L) 08/14/2021   HCT 33.2 (L) 08/14/2021   MCV 87.4 08/14/2021   PLT 291 08/14/2021    Recent Labs  Lab 08/14/21 2118 08/15/21 0243  NA 134* 135  K 5.2* 4.7  CL 106 105  CO2 23 23  BUN 61* 61*  CREATININE 3.31* 3.17*  CALCIUM 7.8* 7.6*  PROT 4.3*  --   BILITOT 0.4  --   ALKPHOS 60  --   ALT 13  --   AST 22  --   GLUCOSE 83 88    Lipid Panel     Component Value Date/Time   CHOL 176 10/10/2020 0810   TRIG 62 10/10/2020 0810   HDL 87 10/10/2020 0810   LDLCALC 77 10/10/2020 0810    BNP (last 3 results) Recent Labs    07/04/21 0809 08/14/21 2118 08/15/21 0243  BNP 48.4 56.7 52.4    HEMOGLOBIN A1C No results found for: HGBA1C, MPG  Cardiac Panel (last 3 results) No results for input(s): CKTOTAL, CKMB, RELINDX in the last 8760 hours.  Invalid input(s): TROPONINHS  No results found for: CKTOTAL, CKMB, CKMBINDEX   TSH Recent Labs    08/14/21 2118  TSH 7.659*      Radiology: DG Chest  2 View  Result Date: 08/14/2021 CLINICAL DATA:  Dyspnea. EXAM: CHEST - 2 VIEW COMPARISON:  Chest x-ray 08/08/2021. FINDINGS: Heart is enlarged, unchanged. There are atherosclerotic calcifications of the aorta. There are small bilateral pleural effusions, increasing from prior study. There is no evidence for pneumothorax. There some strandy opacities at the lung bases favored is atelectasis. No acute fractures are seen. IMPRESSION: 1. Increasing small bilateral pleural effusions. 2. Stable cardiomegaly. Electronically Signed   By: Ronney Asters M.D.   On: 08/14/2021 21:15    Scheduled Meds:  aspirin  81 mg Oral Pre-Cath   aspirin EC  325 mg Oral Daily   atorvastatin  40 mg Oral Daily   ezetimibe  10 mg Oral QPC supper   furosemide  20 mg Intravenous Q12H   heparin  5,000 Units Subcutaneous Q8H   hydrALAZINE  25 mg Oral Q8H   isosorbide mononitrate  60 mg Oral Daily   labetalol  100 mg Oral BID   sodium chloride flush  3 mL Intravenous Q12H   sodium chloride flush  3 mL Intravenous Q12H   tamsulosin  0.4 mg Oral Daily   Continuous Infusions:  sodium chloride     sodium chloride     sodium chloride     PRN Meds:.sodium chloride, sodium chloride, acetaminophen, ondansetron (ZOFRAN) IV, sodium chloride flush, sodium chloride flush  CARDIAC STUDIES:  EKG 08/15/2021: Sinus rhythm 73 bpm Occasional PAC RBBB Low voltage  Echocardiogram 07/05/2021:  Left ventricle cavity is normal in size. Severe concentric hypertrophy of  the left ventricle. Normal global wall motion. Normal LV systolic function  with EF 58%. Doppler evidence of grade I (impaired) diastolic dysfunction,  normal LAP.  Left atrial cavity is mildly dilated.  Structurally normal trileaflet aortic valve. No evidence of aortic  stenosis. Moderate (  Grade II) aortic regurgitation.  The aortic root is mildly dilated, measuring 4.2 cm at sinuses of  Valsalva.  No evidence of pulmonary hypertension.  Previous study on 09/15/2020  reported mod LVH and severe LA dilatation.  Otherwise no significant change noted.    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 Recent echocardiogram and normal BNP not consistent with heart failure as the etiology. No plans for right heart cath.  Appreciate hospitalist Dr. Lorin Mercy' help with medical admission. Appreciate nephrology input Defer the use of diuretics to nephrology recommendation.  Cardiology will sign off. Please call us in case of any questions.      Nigel Mormon, MD Pager: [phone] Office: [phone]

## 2021-08-15 NOTE — Progress Notes (Addendum)
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 He is found to have bilateral pleural effusions R>L, with recent 2 L thoracentesis performed on 08/07/2021. Cr is up to 3.3 from baseline of 1.2-1.3 a year ago. He has 3+ proteinuria. Physical exam shows anasarca, JVD, diminished b/l breath sounds at bases.  Admission was requested by PCP to Dr. Einar Gip with possibility of heart failure and potential plans for RHC. However, recent echocardiogram is incongruent with the degree of anasarca patient has. BNP is completely normal.  Echocardiogram 07/05/2021:  Left ventricle cavity is normal in size. Severe concentric hypertrophy of  the left ventricle. Normal global wall motion. Normal LV systolic function  with EF 58%. Doppler evidence of grade I (impaired) diastolic dysfunction,  normal LAP.  Left atrial cavity is mildly dilated.  Structurally normal trileaflet aortic valve. No evidence of aortic  stenosis. Moderate (Grade II) aortic regurgitation.  The aortic root is mildly dilated, measuring 4.2 cm at sinuses of  Valsalva.  No evidence of pulmonary hypertension.  Previous study on 09/15/2020 reported mod LVH and severe LA dilatation.  Otherwise no significant change noted.   I suspect nephrotic syndrome. Will get nephrology consult and request hospitalist to admit. Cardiology available for any input. No plans for RHC at this time.    Nigel Mormon, MD Pager: [phone] Office: [phone]

## 2021-08-15 NOTE — H&P (Signed)
Corey Beard is an 75 y.o. male.   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 He is found to have bilateral pleural effusions R>L, with recent 2 L thoracentesis performed on 08/07/2021. Cr is up to 3.3 from baseline of 1.2-1.3 a year ago. He has 3+ proteinuria. Physical exam shows anasarca, JVD, diminished b/l breath sounds at bases.   Admission was requested by PCP Dr. Jacquelin Hawking to Dr. Einar Gip with possibility of heart failure and potential plans for RHC. However, recent echocardiogram is incongruent with the degree of anasarca patient has. BNP is completely normal.    Past Medical History:  Diagnosis Date   Arthritis    Glaucoma, both eyes    History of MI (myocardial infarction)    2002   Nocturia    PAF (paroxysmal atrial fibrillation) ([place])    Prostate cancer [facility]) urologist-- dr eskridge    T1c, Gleason 3+3, PSA 9.83, vol 61cc   S/P drug eluting coronary stent placement    Type 2 diabetes mellitus ([place])    Wears glasses     Past Surgical History:  Procedure Laterality Date   CORONARY ANGIOPLASTY  07-01-2001  dr  Terrence Dupont   PCI to  pLAD, mRCA, ostium D1   CORONARY ANGIOPLASTY  12-04-2001  dr Terrence Dupont   PCI  to  ostial D1,  pLAD   CORONARY ANGIOPLASTY WITH STENT PLACEMENT  09-03-2002  dr Terrence Dupont   PCI and DES x1 to pLAD   CYSTOSCOPY WITH BIOPSY  10/04/2015   Procedure: CYSTOSCOPY WITH BIOPSY;  Surgeon: Festus Aloe, MD;  Location: [facility];  Service: Urology;;   INTRAVASCULAR PRESSURE WIRE/FFR STUDY N/A 07/23/2017   Procedure: INTRAVASCULAR PRESSURE WIRE/FFR STUDY;  Surgeon: Adrian Prows, MD;  Location: [place] [facility];  Service: Cardiovascular;  Laterality: N/A;   IR THORACENTESIS ASP PLEURAL SPACE W/IMG GUIDE  08/08/2021   LEFT HEART CATH AND CORONARY ANGIOGRAPHY N/A  07/23/2017   Procedure: LEFT HEART CATH AND CORONARY ANGIOGRAPHY;  Surgeon: Adrian Prows, MD;  Location: [facility];  Service: Cardiovascular;  Laterality: N/A;   RADIOACTIVE SEED IMPLANT N/A 10/04/2015   Procedure: RADIOACTIVE SEED IMPLANT/BRACHYTHERAPY IMPLANT;  Surgeon: Festus Aloe, MD;  Location: [facility];  Service: Urology;  Laterality: N/A;   REPAIR  INCARCERATED EPIGASTRIC HERNIA  06-04-2002     Family History  Problem Relation Age of Onset   Colon cancer Mother     Social History:  reports that he has been smoking cigarettes. He has a 20.00 pack-year smoking history. He has never used smokeless tobacco. He reports current alcohol use. He reports that he does not use drugs.  Allergies:  Allergies  Allergen Reactions   Amlodipine Swelling    Review of Systems  Constitutional: Positive for malaise/fatigue and weight gain. Negative for decreased appetite and weight loss.  HENT:  Negative for congestion.   Eyes:  Negative for visual disturbance.  Cardiovascular:  Positive for dyspnea on exertion and leg swelling. Negative for chest pain, palpitations and syncope.  Respiratory:  Positive for shortness of breath. Negative for cough.   Endocrine: Negative for cold intolerance.  Hematologic/Lymphatic: Does not bruise/bleed easily.  Skin:  Negative for itching and rash.  Musculoskeletal:  Negative for myalgias.  Gastrointestinal:  Negative for abdominal pain, nausea and vomiting.  Genitourinary:  Negative for dysuria.  Neurological:  Negative for dizziness and weakness.  Psychiatric/Behavioral:  The patient is not nervous/anxious.   All other systems reviewed and are negative.   Blood pressure (!) 124/49, pulse 69, temperature 98.4 F (36.9 C), temperature source Oral, resp. rate 17, height 6\' 1"  (1.854 m), weight 118.6 kg, SpO2 95 %. Body mass index is 34.5 kg/m.  Physical Exam Vitals and nursing note reviewed.  Constitutional:      General: He  is not in acute distress.    Appearance: He is well-developed. He is ill-appearing.     Comments: Anasarca  HENT:     Head: Normocephalic and atraumatic.  Eyes:     Conjunctiva/sclera: Conjunctivae normal.     Pupils: Pupils are equal, round, and reactive to light.  Neck:     Vascular: JVD present.  Cardiovascular:     Rate and Rhythm: Normal rate and regular rhythm.     Pulses: Normal pulses and intact distal pulses.     Heart sounds: No murmur heard. Pulmonary:     Effort: Pulmonary effort is normal.     Breath sounds: Normal breath sounds. No wheezing or rales.  Abdominal:     General: Bowel sounds are normal.     Palpations: Abdomen is soft.     Tenderness: There is no rebound.  Musculoskeletal:        General: No tenderness. Normal range of motion.     Right lower leg: Edema (3+) present.     Left lower leg: Edema (3+) present.  Lymphadenopathy:     Cervical: No cervical adenopathy.  Skin:    General: Skin is warm and dry.  Neurological:     Mental Status: He is alert and oriented to person, place, and time.     Cranial Nerves: No cranial nerve deficit.    Results for orders placed or performed during the hospital encounter of 08/14/21 (from the past 48 hour(s))  Resp Panel by RT-PCR (Flu A&B, Covid) Nasopharyngeal Swab     Status: None   Collection Time: 08/14/21  7:57 PM   Specimen: Nasopharyngeal Swab; Nasopharyngeal(NP) swabs in vial transport medium  Result Value Ref Range   SARS Coronavirus 2 by RT PCR NEGATIVE NEGATIVE    Comment: (NOTE) SARS-CoV-2 target nucleic acids are NOT DETECTED.  The SARS-CoV-2 RNA is generally detectable in upper respiratory specimens during the acute phase of infection. The lowest concentration of SARS-CoV-2 viral copies this assay can detect is 138 copies/mL. A negative result does not preclude SARS-Cov-2 infection and should not be used as the sole basis for treatment or other patient management decisions. A negative result may  occur with  improper specimen collection/handling, submission of specimen other than nasopharyngeal swab, presence of viral mutation(s) within the areas targeted by this assay, and inadequate number of viral copies(<138 copies/mL). A negative result must be combined with clinical observations, patient history, and epidemiological information. The expected result is Negative.  Fact Sheet for Patients:  EntrepreneurPulse.com.au  Fact Sheet for Healthcare Providers:  IncredibleEmployment.be  This test is no t yet approved or cleared by the [place] FDA and  has been authorized for detection and/or diagnosis of SARS-CoV-2 by FDA under an Emergency Use Authorization (EUA). This EUA will remain  in effect (meaning this test can be used) for the duration of the COVID-19 declaration under Section 564(b)(1) of the Act, 21 U.S.C.section 360bbb-3(b)(1), unless the authorization is terminated  or revoked sooner.       Influenza A by PCR NEGATIVE NEGATIVE  Influenza B by PCR NEGATIVE NEGATIVE    Comment: (NOTE) The Xpert Xpress SARS-CoV-2/FLU/RSV plus assay is intended as an aid in the diagnosis of influenza from Nasopharyngeal swab specimens and should not be used as a sole basis for treatment. Nasal washings and aspirates are unacceptable for Xpert Xpress SARS-CoV-2/FLU/RSV testing.  Fact Sheet for Patients: EntrepreneurPulse.com.au  Fact Sheet for Healthcare Providers: IncredibleEmployment.be  This test is not yet approved or cleared by the [place] FDA and has been authorized for detection and/or diagnosis of SARS-CoV-2 by FDA under an Emergency Use Authorization (EUA). This EUA will remain in effect (meaning this test can be used) for the duration of the COVID-19 declaration under Section 564(b)(1) of the Act, 21 U.S.C. section 360bbb-3(b)(1), unless the authorization is terminated  or revoked.  Performed at [facility], [place] [address], [place], [place] [address]   CBC     Status: Abnormal   Collection Time: 08/14/21  9:18 PM  Result Value Ref Range   WBC 8.2 4.0 - 10.5 K/uL   RBC 3.80 (L) 4.22 - 5.81 MIL/uL   Hemoglobin 11.0 (L) 13.0 - 17.0 g/dL   HCT 33.2 (L) 39.0 - 52.0 %   MCV 87.4 80.0 - 100.0 fL   MCH 28.9 26.0 - 34.0 pg   MCHC 33.1 30.0 - 36.0 g/dL   RDW 16.3 (H) 11.5 - 15.5 %   Platelets 291 150 - 400 K/uL   nRBC 0.0 0.0 - 0.2 %    Comment: Performed at [facility] [address] [place], [place] [address]  Comprehensive metabolic panel     Status: Abnormal   Collection Time: 08/14/21  9:18 PM  Result Value Ref Range   Sodium 134 (L) 135 - 145 mmol/L   Potassium 5.2 (H) 3.5 - 5.1 mmol/L   Chloride 106 98 - 111 mmol/L   CO2 23 22 - 32 mmol/L   Glucose, Bld 83 70 - 99 mg/dL    Comment: Glucose reference range applies only to samples taken after fasting for at least 8 hours.   BUN 61 (H) 8 - 23 mg/dL   Creatinine, Ser 3.31 (H) 0.61 - 1.24 mg/dL   Calcium 7.8 (L) 8.9 - 10.3 mg/dL   Total Protein 4.3 (L) 6.5 - 8.1 g/dL   Albumin 1.7 (L) 3.5 - 5.0 g/dL   AST 22 15 - 41 U/L   ALT 13 0 - 44 U/L   Alkaline Phosphatase 60 38 - 126 U/L   Total Bilirubin 0.4 0.3 - 1.2 mg/dL   GFR, Estimated 19 (L) >60 mL/min    Comment: (NOTE) Calculated using the CKD-EPI Creatinine Equation (2021)    Anion gap 5 5 - 15    Comment: Performed at [facility], [place] [address], [place], [place] [address]  Brain natriuretic peptide     Status: None   Collection Time: 08/14/21  9:18 PM  Result Value Ref Range   B Natriuretic Peptide 56.7 0.0 - 100.0 pg/mL    Comment: Performed at [facility] [address], [place], [place] [address]  TSH     Status: Abnormal   Collection Time: 08/14/21  9:18 PM  Result Value Ref Range   TSH 7.659 (H) 0.350 - 4.500 uIU/mL    Comment: Performed by a 3rd Generation assay with a functional sensitivity of  <=0.01 uIU/mL. Performed at [facility], [place] [address], [place], [place] [address]   Basic metabolic panel  Status: Abnormal   Collection Time: 08/15/21  2:43 AM  Result Value Ref Range   Sodium 135 135 - 145 mmol/L   Potassium 4.7 3.5 - 5.1 mmol/L   Chloride 105 98 - 111 mmol/L   CO2 23 22 - 32 mmol/L   Glucose, Bld 88 70 - 99 mg/dL    Comment: Glucose reference range applies only to samples taken after fasting for at least 8 hours.   BUN 61 (H) 8 - 23 mg/dL   Creatinine, Ser 3.17 (H) 0.61 - 1.24 mg/dL   Calcium 7.6 (L) 8.9 - 10.3 mg/dL   GFR, Estimated 20 (L) >60 mL/min    Comment: (NOTE) Calculated using the CKD-EPI Creatinine Equation (2021)    Anion gap 7 5 - 15    Comment: Performed at [facility] [address] [place], [place] [address]  Brain natriuretic peptide     Status: None   Collection Time: 08/15/21  2:43 AM  Result Value Ref Range   B Natriuretic Peptide 52.4 0.0 - 100.0 pg/mL    Comment: Performed at [facility] [address] [place], [place] [address]  Urinalysis, Complete w Microscopic Urine, Random     Status: Abnormal   Collection Time: 08/15/21  7:56 AM  Result Value Ref Range   Color, Urine YELLOW YELLOW   APPearance CLOUDY (A) CLEAR   Specific Gravity, Urine 1.013 1.005 - 1.030   pH 5.0 5.0 - 8.0   Glucose, UA NEGATIVE NEGATIVE mg/dL   Hgb urine dipstick SMALL (A) NEGATIVE   Bilirubin Urine NEGATIVE NEGATIVE   Ketones, ur NEGATIVE NEGATIVE mg/dL   Protein, ur >=300 (A) NEGATIVE mg/dL   Nitrite NEGATIVE NEGATIVE   Leukocytes,Ua LARGE (A) NEGATIVE   RBC / HPF 6-10 0 - 5 RBC/hpf   WBC, UA >50 (H) 0 - 5 WBC/hpf   Bacteria, UA MANY (A) NONE SEEN   WBC Clumps PRESENT    Mucus PRESENT    Hyaline Casts, UA PRESENT    Granular Casts, UA PRESENT     Comment: Performed at [facility] [address] [place], [place] [address]  Protein / creatinine ratio, urine     Status: Abnormal   Collection Time: 08/15/21 11:37 AM   Result Value Ref Range   Creatinine, Urine 191.76 mg/dL   Total Protein, Urine 317 mg/dL    Comment: NO NORMAL RANGE ESTABLISHED FOR THIS TEST RESULTS CONFIRMED BY MANUAL DILUTION    Protein Creatinine Ratio 1.65 (H) 0.00 - 0.15 mg/mg[Cre]    Comment: Performed at [facility] [address], [place], [place] [address]    Labs:   Lab Results  Component Value Date   WBC 8.2 08/14/2021   HGB 11.0 (L) 08/14/2021   HCT 33.2 (L) 08/14/2021   MCV 87.4 08/14/2021   PLT 291 08/14/2021    Recent Labs  Lab 08/14/21 2118 08/15/21 0243  NA 134* 135  K 5.2* 4.7  CL 106 105  CO2 23 23  BUN 61* 61*  CREATININE 3.31* 3.17*  CALCIUM 7.8* 7.6*  PROT 4.3*  --   BILITOT 0.4  --   ALKPHOS 60  --   ALT 13  --   AST 22  --   GLUCOSE 83 88    Lipid Panel     Component Value Date/Time   CHOL 176 10/10/2020 0810   TRIG 62 10/10/2020 0810   HDL 87 10/10/2020 0810   LDLCALC 77 10/10/2020  0810    BNP (last 3 results) Recent Labs    07/04/21 0809 08/14/21 2118 08/15/21 0243  BNP 48.4 56.7 52.4    HEMOGLOBIN A1C No results found for: HGBA1C, MPG  Cardiac Panel (last 3 results) No results for input(s): CKTOTAL, CKMB, RELINDX in the last 8760 hours.  Invalid input(s): TROPONINHS  No results found for: CKTOTAL, CKMB, CKMBINDEX   TSH Recent Labs    08/14/21 2118  TSH 7.659*     Medications Prior to Admission  Medication Sig Dispense Refill   aspirin EC 325 MG tablet Take 325 mg by mouth daily.     atorvastatin (LIPITOR) 40 MG tablet Take 1 tablet (40 mg total) by mouth daily. 90 tablet 3   ezetimibe (ZETIA) 10 MG tablet Take 1 tablet (10 mg total) by mouth daily after supper. 90 tablet 3   L-ARGININE-500 PO Take 1,000 mg by mouth daily.     labetalol (NORMODYNE) 100 MG tablet Take 1 tablet (100 mg total) by mouth 2 (two) times daily. 180 tablet 3   Multiple Vitamins-Minerals (MULTIVITAMIN PO) Take 1 tablet by mouth daily.     olmesartan (BENICAR) 40 MG tablet  Take 1 tablet (40 mg total) by mouth daily. 90 tablet 3   potassium chloride (KLOR-CON) 10 MEQ tablet Take 10 mEq by mouth daily.     tamsulosin (FLOMAX) 0.4 MG CAPS capsule Take 0.4 mg by mouth daily.     torsemide (DEMADEX) 20 MG tablet Take 1 tablet (20 mg total) by mouth daily. Take 1 tab twice daily if leg swelling is worse (Patient taking differently: Take 20 mg by mouth 2 (two) times daily.) 180 tablet 1      Current Facility-Administered Medications:    0.9 %  sodium chloride infusion, 250 mL, Intravenous, PRN, Adrian Prows, MD   0.9 %  sodium chloride infusion, 250 mL, Intravenous, PRN, Adrian Prows, MD   0.9 %  sodium chloride infusion, , Intravenous, Continuous, Adrian Prows, MD   [START ON 08/16/2021] 0.9 %  sodium chloride infusion, , Intravenous, Continuous, Boisseau, Hayley, PA   acetaminophen (TYLENOL) tablet 650 mg, 650 mg, Oral, Q4H PRN, Adrian Prows, MD   aspirin chewable tablet 81 mg, 81 mg, Oral, Pre-Cath, Adrian Prows, MD   aspirin EC tablet 325 mg, 325 mg, Oral, Daily, Adrian Prows, MD, 325 mg at 08/15/21 1018   atorvastatin (LIPITOR) tablet 40 mg, 40 mg, Oral, Daily, Adrian Prows, MD, 40 mg at 08/15/21 1018   ezetimibe (ZETIA) tablet 10 mg, 10 mg, Oral, QPC supper, Adrian Prows, MD   furosemide (LASIX) injection 60 mg, 60 mg, Intravenous, Q6H, Justin Mend, MD   [START ON 08/17/2021] heparin injection 5,000 Units, 5,000 Units, Subcutaneous, Q8H, Turpin, Pamela, PA-C   hydrALAZINE (APRESOLINE) tablet 25 mg, 25 mg, Oral, Q8H, Adrian Prows, MD, 25 mg at 08/15/21 1449   isosorbide mononitrate (IMDUR) 24 hr tablet 60 mg, 60 mg, Oral, Daily, Adrian Prows, MD, 60 mg at 08/15/21 1018   labetalol (NORMODYNE) tablet 100 mg, 100 mg, Oral, BID, Adrian Prows, MD, 100 mg at 08/15/21 1018   ondansetron (ZOFRAN) injection 4 mg, 4 mg, Intravenous, Q6H PRN, Adrian Prows, MD   sodium chloride flush (NS) 0.9 % injection 3 mL, 3 mL, Intravenous, Q12H, Adrian Prows, MD, 3 mL at 08/15/21 1022   sodium chloride  flush (NS) 0.9 % injection 3 mL, 3 mL, Intravenous, PRN, Adrian Prows, MD   sodium chloride flush (NS) 0.9 % injection 3 mL, 3 mL,  Intravenous, Q12H, Adrian Prows, MD, 3 mL at 08/15/21 1021   sodium chloride flush (NS) 0.9 % injection 3 mL, 3 mL, Intravenous, PRN, Adrian Prows, MD   tamsulosin [facility]) capsule 0.4 mg, 0.4 mg, Oral, Daily, Adrian Prows, MD, 0.4 mg at 08/15/21 1018   Today's Vitals   08/15/21 0734 08/15/21 0830 08/15/21 1139 08/15/21 1521  BP: (!) 128/57  (!) 101/54 (!) 124/49  Pulse: 76  74 69  Resp: 19  20 17   Temp: 98.5 F (36.9 C)  98.2 F (36.8 C) 98.4 F (36.9 C)  TempSrc: Oral  Oral Oral  SpO2: 92%  94% 95%  Weight:      Height:      PainSc:  0-No pain     Body mass index is 34.5 kg/m.    CARDIAC STUDIES:   EKG 08/15/2021: Sinus rhythm 73 bpm Occasional PAC RBBB Low voltage   Echocardiogram 07/05/2021:  Left ventricle cavity is normal in size. Severe concentric hypertrophy of  the left ventricle. Normal global wall motion. Normal LV systolic function  with EF 58%. Doppler evidence of grade I (impaired) diastolic dysfunction,  normal LAP.  Left atrial cavity is mildly dilated.  Structurally normal trileaflet aortic valve. No evidence of aortic  stenosis. Moderate (Grade II) aortic regurgitation.  The aortic root is mildly dilated, measuring 4.2 cm at sinuses of  Valsalva.  No evidence of pulmonary hypertension.  Previous study on 09/15/2020 reported mod LVH and severe LA dilatation.  Otherwise no significant change noted.      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 Recent echocardiogram and normal BNP not consistent  with heart failure as the etiology. No plans for right heart cath.  Appreciate hospitalist Dr. Lorin Mercy' help with medical admission. Appreciate nephrology input Defer the use of diuretics to nephrology recommendation.   Appreciate Dr. Lorin Mercy from [facility] hospitalist for assuming patient care. Cardiology will sign off. Please call us in case of any questions.      Nigel Mormon, MD Pager: [phone] Office: [phone]

## 2021-08-15 NOTE — Progress Notes (Addendum)
Heart Failure Nurse Navigator Progress Note  Following this admission to assess for HV TOC readiness. Per cardiology note, admission suspected d/t nephrotic syndrome--nephrology consult. Appreciate interprofessional care approach to benefit patient.   Will continue to follow for education and assessment as needed.   Pricilla Holm, MSN, RN Heart Failure Nurse Navigator [phone]

## 2021-08-15 NOTE — Consult Note (Addendum)
Medical Consultation   Corey Beard  BJY:[id number]  DOB: 08/05/1946  DOA: 08/14/2021  PCP: Benito Mccreedy, MD   Outpatient Specialists: Einar Gip - cardiology; Junious Silk - urology   Lives with wife, who is non-ambulatory; Donald Prose is daughter, [phone]   Requesting physician: Einar Gip - cardiology  Reason for consultation: Came in with SOB, thought to be CHF.  Developed severe edema -> anasarca.  Had drainage.  BNP is normal.  Appears to be nephrotic syndrome.  Likely needs nephrology, progressive worsening of renal function.  Appears to be stable from a cardiology standpoint, requests that we assume care.   History of Present Illness: Corey Beard is an 75 y.o. male CAD s/p stent; afib; prostate CA; and DM presenting with marked edema/anasarca.  He has been having edema - hands, legs, anasarca.  He started with issues in June or July.  He was having problems with amlodipine several years and it took a while to get him off that.  He had bloodwork in April with mildly worsening renal function,  In September the swelling started getting worse.  He is bloated and swollen everywhere.  9/21, repeat bloodwork shows worsening renal function.  CT C/A/P with anasarca.  He had thoracentesis 1 week ago.  Since then, he has been having SOB, fatigue, joint pain, misery.  He feels SOB with any effort.  Mild orthopnea.  No PND.  +cough, mildly productive, has not looked at sputum.  No fever.  No chest pain.  +foamy urine.  +nocturia.  He has been taking Lasix -> Demadex.    Review of Systems:  ROS As per HPI otherwise review of systems negative.    Past Medical History: Past Medical History:  Diagnosis Date   Arthritis    Glaucoma, both eyes    History of MI (myocardial infarction)    2002   Nocturia    PAF (paroxysmal atrial fibrillation) ([place])    Prostate cancer [facility]) urologist-- dr eskridge    T1c, Gleason 3+3, PSA 9.83, vol 61cc   S/P drug eluting coronary stent  placement    Type 2 diabetes mellitus ([place])    Wears glasses     Past Surgical History: Past Surgical History:  Procedure Laterality Date   CORONARY ANGIOPLASTY  07-01-2001  dr  Terrence Dupont   PCI to  pLAD, mRCA, ostium D1   CORONARY ANGIOPLASTY  12-04-2001  dr Terrence Dupont   PCI  to  ostial D1,  pLAD   CORONARY ANGIOPLASTY WITH STENT PLACEMENT  09-03-2002  dr Terrence Dupont   PCI and DES x1 to pLAD   CYSTOSCOPY WITH BIOPSY  10/04/2015   Procedure: CYSTOSCOPY WITH BIOPSY;  Surgeon: Festus Aloe, MD;  Location: [facility];  Service: Urology;;   INTRAVASCULAR PRESSURE WIRE/FFR STUDY N/A 07/23/2017   Procedure: INTRAVASCULAR PRESSURE WIRE/FFR STUDY;  Surgeon: Adrian Prows, MD;  Location: [place] CV LAB;  Service: Cardiovascular;  Laterality: N/A;   IR THORACENTESIS ASP PLEURAL SPACE W/IMG GUIDE  08/08/2021   LEFT HEART CATH AND CORONARY ANGIOGRAPHY N/A 07/23/2017   Procedure: LEFT HEART CATH AND CORONARY ANGIOGRAPHY;  Surgeon: Adrian Prows, MD;  Location: [place] CV LAB;  Service: Cardiovascular;  Laterality: N/A;   RADIOACTIVE SEED IMPLANT N/A 10/04/2015   Procedure: RADIOACTIVE SEED IMPLANT/BRACHYTHERAPY IMPLANT;  Surgeon: Festus Aloe, MD;  Location: [facility];  Service: Urology;  Laterality: N/A;   REPAIR  INCARCERATED EPIGASTRIC HERNIA  06-04-2002  Allergies:   Allergies  Allergen Reactions   Amlodipine Swelling     Social History:  reports that he has been smoking cigarettes. He has a 20.00 pack-year smoking history. He has never used smokeless tobacco. He reports current alcohol use. He reports that he does not use drugs.   Family History: Family History  Problem Relation Age of Onset   Colon cancer Mother       Physical Exam: Vitals:   08/15/21 0429 08/15/21 0734 08/15/21 1139 08/15/21 1521  BP: (!) 128/57 (!) 128/57 (!) 101/54 (!) 124/49  Pulse: 77 76 74 69  Resp: 18 19 20 17   Temp: 98.4 F (36.9 C) 98.5 F (36.9 C) 98.2 F  (36.8 C) 98.4 F (36.9 C)  TempSrc: Oral Oral Oral Oral  SpO2: 92% 92% 94% 95%  Weight: 118.6 kg     Height:        Constitutional: Alert and awake, oriented x3, not in any acute distress. Eyes:  EOMI, irises appear normal, anicteric sclera,  ENMT: external ears and nose appear normal, normal hearing, Lips appear normal, oropharynx mucosa, tongue, posterior pharynx appear normal  Neck: neck appears normal, no masses, normal ROM CVS: S1-S2 clear, no murmur rubs or gallops, normal pedal pulses  Respiratory:  clear to auscultation bilaterally, no wheezing, rales or rhonchi. Respiratory effort normal. No accessory muscle use.  Abdomen: soft nontender, +edema of the abdominal wall vs. ascites Musculoskeletal: : no cyanosis, clubbing noted bilaterally; 2-3+ anasarca noted Neuro: Cranial nerves II-XII intact Psych: judgement and insight appear normal, stable mood and affect, mental status Skin: no rashes or lesions or ulcers, no induration or nodules    Data reviewed:  I have personally reviewed the recent labs and imaging studies  Pertinent Labs:   BUN 61/Creatinine 3.17/GFR 20, marginally improved from 10/3; 22/1.28/64 in 09/2020 Albumin  1.7 WBC 8.2 Hgb 11.0 TSH 7.659 UA: small Hgb, large LE, >300 protein, >50 WBC, many bacteria COVID/flu negative   Inpatient Medications:   Scheduled Meds:  aspirin  81 mg Oral Pre-Cath   aspirin EC  325 mg Oral Daily   atorvastatin  40 mg Oral Daily   ezetimibe  10 mg Oral QPC supper   furosemide  60 mg Intravenous Q6H   [START ON 08/17/2021] heparin  5,000 Units Subcutaneous Q8H   hydrALAZINE  25 mg Oral Q8H   isosorbide mononitrate  60 mg Oral Daily   labetalol  100 mg Oral BID   sodium chloride flush  3 mL Intravenous Q12H   sodium chloride flush  3 mL Intravenous Q12H   tamsulosin  0.4 mg Oral Daily   Continuous Infusions:  sodium chloride     sodium chloride     sodium chloride     [START ON 08/16/2021] sodium chloride        Radiological Exams on Admission: DG Chest 2 View  Result Date: 08/14/2021 CLINICAL DATA:  Dyspnea. EXAM: CHEST - 2 VIEW COMPARISON:  Chest x-ray 08/08/2021. FINDINGS: Heart is enlarged, unchanged. There are atherosclerotic calcifications of the aorta. There are small bilateral pleural effusions, increasing from prior study. There is no evidence for pneumothorax. There some strandy opacities at the lung bases favored is atelectasis. No acute fractures are seen. IMPRESSION: 1. Increasing small bilateral pleural effusions. 2. Stable cardiomegaly. Electronically Signed   By: Ronney Asters M.D.   On: 08/14/2021 21:15    Impression/Recommendations Principal Problem:   Nephrotic syndrome Active Problems:   Malignant neoplasm of prostate ([place])  Coronary artery disease   Essential hypertension   Dyslipidemia  Nephrotic syndrome -Patient with progressive anasarca, renal failure, hypoalbuminemia, and proteinuria -Initially thought to be related to CHF but now more likely renal etiology -Will assume care of patient -Nephrology consult -Renal US -Likely to need renal biopsy -Attempt diuresis with Lasix -UA is also suggestive of UTI; will order culture.  HTN -Continue Labetalol -Hold Benicar  HLD -Continue Lipitor, Zetia  CAD  -s/p stent -Continue ASA  Prostate CA -watchful waiting -Continue Flomax  DM -Reported history but no records available, normal glucose, not on meds -Will follow with fasting labs only at this time     Thank you for this consultation.  Our [facility] hospitalist team will assume care of the patient at this time.   Time Spent: 50 minutes  Karmen Bongo M.D. [facility] 08/15/2021, 4:53 PM

## 2021-08-15 NOTE — Consult Note (Signed)
Chief Complaint: Patient was seen in consultation today for random renal biopsy at the request of Dr Leanora Cover   Supervising Physician: Arne Cleveland  Patient Status: [facility] - In-pt  History of Present Illness: Corey Beard is a 75 y.o. male   Hx DM; prostate cancer; CAD Acute kidney injury Anasarca Progressive edema Dyspnea Admitted per Cardiology for heart failure  Concerning for nephrotic syndrome:  recent rise in Creatinine 3+ protein; hematuria and high wbc Noted foamy urine and wt gain over 1 mo  Nephrology requesting random renal biopsy Scheduled in IR for same - 10/5     Past Medical History:  Diagnosis Date   Arthritis    Glaucoma, both eyes    History of MI (myocardial infarction)    2002   Nocturia    PAF (paroxysmal atrial fibrillation) ([place])    Prostate cancer [facility]) urologist-- dr eskridge    T1c, Gleason 3+3, PSA 9.83, vol 61cc   S/P drug eluting coronary stent placement    Type 2 diabetes mellitus ([place])    Wears glasses     Past Surgical History:  Procedure Laterality Date   CORONARY ANGIOPLASTY  07-01-2001  dr  Terrence Dupont   PCI to  pLAD, mRCA, ostium D1   CORONARY ANGIOPLASTY  12-04-2001  dr Terrence Dupont   PCI  to  ostial D1,  pLAD   CORONARY ANGIOPLASTY WITH STENT PLACEMENT  09-03-2002  dr Terrence Dupont   PCI and DES x1 to pLAD   CYSTOSCOPY WITH BIOPSY  10/04/2015   Procedure: CYSTOSCOPY WITH BIOPSY;  Surgeon: Festus Aloe, MD;  Location: [facility];  Service: Urology;;   INTRAVASCULAR PRESSURE WIRE/FFR STUDY N/A 07/23/2017   Procedure: INTRAVASCULAR PRESSURE WIRE/FFR STUDY;  Surgeon: Adrian Prows, MD;  Location: [place] CV LAB;  Service: Cardiovascular;  Laterality: N/A;   IR THORACENTESIS ASP PLEURAL SPACE W/IMG GUIDE  08/08/2021   LEFT HEART CATH AND CORONARY ANGIOGRAPHY N/A 07/23/2017   Procedure: LEFT HEART CATH AND CORONARY ANGIOGRAPHY;  Surgeon: Adrian Prows, MD;  Location: [place] CV LAB;  Service: Cardiovascular;   Laterality: N/A;   RADIOACTIVE SEED IMPLANT N/A 10/04/2015   Procedure: RADIOACTIVE SEED IMPLANT/BRACHYTHERAPY IMPLANT;  Surgeon: Festus Aloe, MD;  Location: [facility];  Service: Urology;  Laterality: N/A;   REPAIR  INCARCERATED EPIGASTRIC HERNIA  06-04-2002    Allergies: Amlodipine  Medications: Prior to Admission medications   Medication Sig Start Date End Date Taking? Authorizing Provider  aspirin EC 325 MG tablet Take 325 mg by mouth daily.   Yes [provider]  atorvastatin (LIPITOR) 40 MG tablet Take 1 tablet (40 mg total) by mouth daily. 06/23/21  Yes Adrian Prows, MD  ezetimibe (ZETIA) 10 MG tablet Take 1 tablet (10 mg total) by mouth daily after supper. 06/23/21 06/18/22 Yes Adrian Prows, MD  L-ARGININE-500 PO Take 1,000 mg by mouth daily.   Yes [provider]  labetalol (NORMODYNE) 100 MG tablet Take 1 tablet (100 mg total) by mouth 2 (two) times daily. 06/23/21  Yes Adrian Prows, MD  Multiple Vitamins-Minerals (MULTIVITAMIN PO) Take 1 tablet by mouth daily.   Yes [provider]  olmesartan (BENICAR) 40 MG tablet Take 1 tablet (40 mg total) by mouth daily. 06/23/21  Yes Adrian Prows, MD  potassium chloride (KLOR-CON) 10 MEQ tablet Take 10 mEq by mouth daily. 05/23/21  Yes [provider]  tamsulosin (FLOMAX) 0.4 MG CAPS capsule Take 0.4 mg by mouth daily.   Yes [provider]  torsemide (DEMADEX) 20 MG tablet Take 1 tablet (20 mg total) by mouth daily. Take 1 tab twice daily if leg swelling is worse Patient taking differently: Take 20 mg by mouth 2 (two) times daily. 05/12/21 05/07/22 Yes Adrian Prows, MD     Family History  Problem Relation Age of Onset   Colon cancer Mother     Social History   Socioeconomic History   Marital status: Married    Spouse name: Not on file   Number of children: 2   Years of education: Not on file   Highest education level: Not on file  Occupational History   Occupation: Physician   Tobacco Use   Smoking status: Every Day    Packs/day: 0.50    Years: 40.00    Pack years: 20.00    Types: Cigarettes   Smokeless tobacco: Never  Vaping Use   Vaping Use: Never used  Substance and Sexual Activity   Alcohol use: Yes    Comment: occasionally   Drug use: No   Sexual activity: Not on file  Other Topics Concern   Not on file  Social History Narrative   Not on file   Social Determinants of Health   Financial Resource Strain: Not on file  Food Insecurity: Not on file  Transportation Needs: Not on file  Physical Activity: Not on file  Stress: Not on file  Social Connections: Not on file     Review of Systems: A 12 point ROS discussed and pertinent positives are indicated in the HPI above.  All other systems are negative.  Review of Systems  Constitutional:  Positive for activity change, fatigue and unexpected weight change.  Respiratory:  Positive for cough and shortness of breath.   Cardiovascular:  Positive for leg swelling.  Gastrointestinal:  Positive for abdominal distention and nausea. Negative for abdominal pain.  Genitourinary:  Negative for difficulty urinating.  Neurological:  Negative for weakness.  Psychiatric/Behavioral:  Negative for behavioral problems and confusion.    Vital Signs: BP (!) 101/54 (BP Location: Right Arm)   Pulse 74   Temp 98.2 F (36.8 C) (Oral)   Resp 20   Ht 6\' 1"  (1.854 m)   Wt 261 lb 7.5 oz (118.6 kg)   SpO2 94%   BMI 34.50 kg/m   Physical Exam Vitals reviewed.  HENT:     Mouth/Throat:     Mouth: Mucous membranes are moist.  Cardiovascular:     Rate and Rhythm: Normal rate and regular rhythm.  Pulmonary:     Comments: Lung sounds are noted abnormal Rt side and expiratory breath sounds have more apparent abnormality  Abdominal:     General: There is distension.     Palpations: Abdomen is soft.  Musculoskeletal:        General: Swelling present. No tenderness. Normal range of motion.     Right lower leg:  Edema present.     Left lower leg: Edema present.     Comments: Anascarca all extremities  Skin:    General: Skin is warm.  Neurological:     Mental Status: He is alert and oriented to person, place, and time.  Psychiatric:        Behavior: Behavior normal.    Imaging: DG Chest 1 View  Result Date: 08/08/2021 CLINICAL DATA:  75 year old status post right-sided thoracentesis EXAM: CHEST  1 VIEW COMPARISON:  CT 08/02/2021 FINDINGS: Cardiomediastinal silhouette within normal limits in size and contour. Heart borders partially obscured by overlying lung/pleural disease.  Calcifications of the aortic arch. Meniscus at the bilateral lung bases, larger on the left. No pneumothorax. No confluent airspace disease.  No interlobular septal thickening. No displaced fracture IMPRESSION: No complicating features status post right-sided thoracentesis. Small bilateral pleural effusions persist. Electronically Signed   By: Corrie Mckusick D.O.   On: 08/08/2021 11:18   DG Chest 2 View  Result Date: 08/14/2021 CLINICAL DATA:  Dyspnea. EXAM: CHEST - 2 VIEW COMPARISON:  Chest x-ray 08/08/2021. FINDINGS: Heart is enlarged, unchanged. There are atherosclerotic calcifications of the aorta. There are small bilateral pleural effusions, increasing from prior study. There is no evidence for pneumothorax. There some strandy opacities at the lung bases favored is atelectasis. No acute fractures are seen. IMPRESSION: 1. Increasing small bilateral pleural effusions. 2. Stable cardiomegaly. Electronically Signed   By: Ronney Asters M.D.   On: 08/14/2021 21:15   IR THORACENTESIS ASP PLEURAL SPACE W/IMG GUIDE  Result Date: 08/08/2021 INDICATION: Pt with hx of CAD, HLD, HTN, prostate cancer and edema with recent anasarca and fluid overload with pleural effusions. Request for diagnostic and therapeutic thoracentesis. EXAM: ULTRASOUND GUIDED DIAGNOSTIC AND THERAPEUTIC THORACENTESIS MEDICATIONS: 77ml 1% lidocaine COMPLICATIONS: None  immediate. PROCEDURE: An ultrasound guided thoracentesis was thoroughly discussed with the patient and questions answered. The benefits, risks, alternatives and complications were also discussed. The patient understands and wishes to proceed with the procedure. Written consent was obtained. Ultrasound was performed to localize and mark an adequate pocket of fluid in the right chest. The area was then prepped and draped in the normal sterile fashion. 1% Lidocaine was used for local anesthesia. Under ultrasound guidance a 6 Fr Safe-T-Centesis catheter was introduced. Thoracentesis was performed. The catheter was removed and a dressing applied. FINDINGS: A total of approximately 2 L of clear, yellow fluid was removed. Samples were sent to the laboratory as requested by the clinical team. IMPRESSION: Successful ultrasound guided right thoracentesis yielding 2 L of pleural fluid. Electronically Signed   By: Corrie Mckusick D.O.   On: 08/08/2021 13:05    Labs:  CBC: Recent Labs    08/14/21 2118  WBC 8.2  HGB 11.0*  HCT 33.2*  PLT 291    COAGS: No results for input(s): INR, APTT in the last 8760 hours.  BMP: Recent Labs    10/10/20 0810 08/14/21 2118 08/15/21 0243  NA 136 134* 135  K 4.1 5.2* 4.7  CL 97 106 105  CO2 25 23 23   GLUCOSE 91 83 88  BUN 22 61* 61*  CALCIUM 9.3 7.8* 7.6*  CREATININE 1.28* 3.31* 3.17*  GFRNONAA 56* 19* 20*  GFRAA 64  --   --     LIVER FUNCTION TESTS: Recent Labs    08/14/21 2118  BILITOT 0.4  AST 22  ALT 13  ALKPHOS 60  PROT 4.3*  ALBUMIN 1.7*    TUMOR MARKERS: No results for input(s): AFPTM, CEA, CA199, CHROMGRNA in the last 8760 hours.  Assessment and Plan:  Nephrotic syndrome Anasarca; proteinuria Scheduled for random renal biopsy in am Risks and benefits of random renal biopsy was discussed with the patient and/or patient's family including, but not limited to bleeding, infection, damage to adjacent structures or low yield requiring  additional tests.  All of the questions were answered and there is agreement to proceed.  Consent signed and in chart.    Thank you for this interesting consult.  I greatly enjoyed meeting LUMIR DEMETRIOU and look forward to participating in their care.  A copy of  this report was sent to the requesting provider on this date.  Electronically Signed: Lavonia Drafts, PA-C 08/15/2021, 1:45 PM   I spent a total of 20 Minutes    in face to face in clinical consultation, greater than 50% of which was counseling/coordinating care for random renal bx

## 2021-08-16 ENCOUNTER — Inpatient Hospital Stay (HOSPITAL_COMMUNITY): Payer: Medicare PPO

## 2021-08-16 DIAGNOSIS — N049 Nephrotic syndrome with unspecified morphologic changes: Secondary | ICD-10-CM | POA: Diagnosis not present

## 2021-08-16 DIAGNOSIS — R188 Other ascites: Secondary | ICD-10-CM | POA: Diagnosis not present

## 2021-08-16 DIAGNOSIS — N179 Acute kidney failure, unspecified: Secondary | ICD-10-CM

## 2021-08-16 LAB — SODIUM, URINE, RANDOM: Sodium, Ur: 38 mmol/L

## 2021-08-16 LAB — HIV ANTIBODY (ROUTINE TESTING W REFLEX): HIV Screen 4th Generation wRfx: NONREACTIVE

## 2021-08-16 LAB — RPR: RPR Ser Ql: NONREACTIVE

## 2021-08-16 LAB — RENAL FUNCTION PANEL
Albumin: 1.5 g/dL — ABNORMAL LOW (ref 3.5–5.0)
Anion gap: 8 (ref 5–15)
BUN: 65 mg/dL — ABNORMAL HIGH (ref 8–23)
CO2: 22 mmol/L (ref 22–32)
Calcium: 7.9 mg/dL — ABNORMAL LOW (ref 8.9–10.3)
Chloride: 106 mmol/L (ref 98–111)
Creatinine, Ser: 3.56 mg/dL — ABNORMAL HIGH (ref 0.61–1.24)
GFR, Estimated: 17 mL/min — ABNORMAL LOW (ref >60)
Glucose, Bld: 82 mg/dL (ref 70–99)
Phosphorus: 4.9 mg/dL — ABNORMAL HIGH (ref 2.5–4.6)
Potassium: 4.7 mmol/L (ref 3.5–5.1)
Sodium: 136 mmol/L (ref 135–145)

## 2021-08-16 LAB — LIPID PANEL
Cholesterol: 126 mg/dL (ref 0–200)
HDL: 38 mg/dL — ABNORMAL LOW (ref >40)
LDL Cholesterol: 77 mg/dL (ref 0–99)
Total CHOL/HDL Ratio: 3.3 RATIO
Triglycerides: 55 mg/dL (ref <150)
VLDL: 11 mg/dL (ref 0–40)

## 2021-08-16 LAB — PROTIME-INR
INR: 1 (ref 0.8–1.2)
Prothrombin Time: 13 seconds (ref 11.4–15.2)

## 2021-08-16 LAB — URINE CULTURE: Special Requests: NORMAL

## 2021-08-16 LAB — T4, FREE: Free T4: 0.96 ng/dL (ref 0.61–1.12)

## 2021-08-16 LAB — GLUCOSE, CAPILLARY: Glucose-Capillary: 85 mg/dL (ref 70–99)

## 2021-08-16 LAB — HEPATITIS B CORE ANTIBODY, TOTAL: Hep B Core Total Ab: NONREACTIVE

## 2021-08-16 LAB — CREATININE, URINE, RANDOM: Creatinine, Urine: 178.65 mg/dL

## 2021-08-16 MED ORDER — FUROSEMIDE 10 MG/ML IJ SOLN: 80.0000 mg | Freq: Three times a day (TID) | INTRAMUSCULAR | Status: DC

## 2021-08-16 MED ORDER — FUROSEMIDE 10 MG/ML IJ SOLN
80.0000 mg | Freq: Three times a day (TID) | INTRAMUSCULAR | Status: DC
  Administered 2021-08-16 – 2021-08-17 (×3): 80 mg via INTRAVENOUS
  Filled 2021-08-16 (×3): qty 8

## 2021-08-16 MED ORDER — HEPARIN SODIUM (PORCINE) 5000 UNIT/ML IJ SOLN
5000.0000 [IU] | Freq: Three times a day (TID) | INTRAMUSCULAR | Status: DC
  Administered 2021-08-16 – 2021-08-21 (×16): 5000 [IU] via SUBCUTANEOUS
  Filled 2021-08-16 (×16): qty 1

## 2021-08-16 NOTE — Evaluation (Signed)
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 PMH: HTN, DM2, prostate ca, tobacco use, carotid stenosis, non obstructive CAD, glaucoma, arthritis and MI.  Clinical Impression  PTA pt living in 2 story home with bed and bath upstairs. Pt completely independent serving as care giver to his wife. Pt is currently limited in safe mobility by decreased endurance and ROM due to increased fluid load. Pt is min A for bed mobility, supervision for transfers and ambulation with RW, min guard for ambulation without AD. PT recommending HHPT at discharge to work on strengthening to be able to climb stairs to his bed room. PT will continue to follow acutely.        Recommendations for follow up therapy are one component of a multi-disciplinary discharge planning process, led by the attending physician.  Recommendations may be updated based on patient status, additional functional criteria and insurance authorization.  Follow Up Recommendations Home health PT;Supervision for mobility/OOB    Equipment Recommendations  None recommended by PT       Precautions / Restrictions Precautions Precautions: Fall Restrictions Weight Bearing Restrictions: No      Mobility  Bed Mobility Overal bed mobility: Needs Assistance Bed Mobility: Supine to Sit;Sit to Supine     Supine to sit: Supervision Sit to supine: Min assist   General bed mobility comments: supervision for safety with coming to EoB, increased time and effort required, min A for returning LE to bed due to increased edema weight    Transfers Overall transfer level: Needs assistance Equipment used: Rolling walker (2 wheeled) Transfers: Sit to/from Stand Sit to Stand: Supervision         General  transfer comment: cues for hand placement  Ambulation/Gait Ambulation/Gait assistance: Supervision;Min guard Gait Distance (Feet): 60 Feet Assistive device: Rolling walker (2 wheeled);None Gait Pattern/deviations: Step-through pattern;Decreased step length - right;Decreased step length - left;Wide base of support Gait velocity: slowed Gait velocity interpretation: <1.31 ft/sec, indicative of household ambulator General Gait Details: supervision for ambulation with RW, min guard for ambulation without AD 10 feet back to bed, increasd BoS and waddling gait due to swelling      Balance Overall balance assessment: Needs assistance   Sitting balance-Leahy Scale: Good     Standing balance support: During functional activity Standing balance-Leahy Scale: Fair                               Pertinent Vitals/Pain Pain Assessment: Faces Faces Pain Scale: No hurt    Home Living Family/patient expects to be discharged to:: Private residence Living Arrangements: Spouse/significant other (spous is not in good health) Available Help at Discharge: Family;Available 24 hours/day Type of Home: House Home Access: Ramped entrance     Home Layout: Two level;Able to live on main level with bedroom/bathroom Home Equipment: Grab bars - toilet;Toilet riser;Walker - 2 wheels;Walker - 4 wheels;Wheelchair - manual;Hand held shower head;Grab bars - tub/shower;Shower seat      Prior Function Level of Independence: Independent         Comments: walking without AD, assisting his wife, has a caregiver staying with her while he is hospitalized     Hand Dominance   Dominant Hand: Right    Extremity/Trunk Assessment  Upper Extremity Assessment Upper Extremity Assessment: Defer to OT evaluation    Lower Extremity Assessment Lower Extremity Assessment: Overall WFL for tasks assessed (edema limiting full ROM)    Cervical / Trunk Assessment Cervical / Trunk Assessment: Other  exceptions Cervical / Trunk Exceptions: abdominal swelling  Communication   Communication: No difficulties  Cognition Arousal/Alertness: Awake/alert Behavior During Therapy: WFL for tasks assessed/performed Overall Cognitive Status: Within Functional Limits for tasks assessed                                        General Comments General comments (skin integrity, edema, etc.): Daughter in room, explains that her mother has 24 hour care for now and family will be able to assist with pt when he returns home. VSS on RA        Assessment/Plan    PT Assessment Patient needs continued PT services  PT Problem List Decreased range of motion;Decreased activity tolerance;Decreased balance;Decreased mobility;Cardiopulmonary status limiting activity       PT Treatment Interventions DME instruction;Gait training;Stair training;Functional mobility training;Therapeutic activities;Therapeutic exercise;Balance training;Cognitive remediation;Patient/family education    PT Goals (Current goals can be found in the Care Plan section)  Acute Rehab PT Goals Patient Stated Goal: return home PT Goal Formulation: With patient/family Time For Goal Achievement: 08/30/21 Potential to Achieve Goals: Good    Frequency Min 3X/week    AM-PAC PT "6 Clicks" Mobility  Outcome Measure Help needed turning from your back to your side while in a flat bed without using bedrails?: None Help needed moving from lying on your back to sitting on the side of a flat bed without using bedrails?: None Help needed moving to and from a bed to a chair (including a wheelchair)?: None Help needed standing up from a chair using your arms (e.g., wheelchair or bedside chair)?: None Help needed to walk in hospital room?: A Little Help needed climbing 3-5 steps with a railing? : A Lot 6 Click Score: 21    End of Session Equipment Utilized During Treatment: Gait belt Activity Tolerance: Patient limited by  fatigue Patient left: in bed;with call bell/phone within reach;with family/visitor present Nurse Communication: Mobility status PT Visit Diagnosis: Other abnormalities of gait and mobility (R26.89);Muscle weakness (generalized) (M62.81);Difficulty in walking, not elsewhere classified (R26.2)    Time: 9924-2683 PT Time Calculation (min) (ACUTE ONLY): 26 min   Charges:   PT Evaluation $PT Eval Moderate Complexity: 1 Mod PT Treatments $Therapeutic Activity: 8-22 mins        Bryttani Blew B. Migdalia Dk PT, DPT Acute Rehabilitation Services Pager [phone] Office [phone]   [facility] 08/16/2021, 5:01 PM

## 2021-08-16 NOTE — Progress Notes (Signed)
Mobility Specialist Progress Note   08/16/21 1015  Mobility  Activity Ambulated in hall  Level of Assistance Modified independent, requires aide device or extra time  Assistive Device Front wheel walker  Distance Ambulated (ft) 60 ft  Mobility Ambulated with assistance in room  Mobility Response Tolerated well  Mobility performed by Mobility specialist  $Mobility charge 1 Mobility   Pt received standing in room w/ no c/o of symptoms and agreeable to mobility session. Pt asymptomatic throughout but fatigued quickly. Returned back to recliner w/ table in front, call bell by side and daughter in room.   Pre Mobility: 80 HR, 95% SpO2 Post Mobility: 95 HR, 95% SpO2  Holland Falling Mobility Specialist Phone Number [phone]

## 2021-08-16 NOTE — Progress Notes (Signed)
PROGRESS NOTE    Corey Beard  JJO:[id number] DOB: 10/22/1946 DOA: 08/14/2021 PCP: Benito Mccreedy, MD    No chief complaint on file.   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 He is found to have bilateral pleural effusions R>L, with recent 2 L thoracentesis performed on 08/07/2021. Cr is up to 3.3 from baseline of 1.2-1.3 a year ago. He has 3+ proteinuria.  He was initially admitted by cardiology, transferred to [facility].  Nephrology consulted for evaluation of nephrotic syndrome.   Assessment & Plan:   Principal Problem:   Nephrotic syndrome Active Problems:   Malignant neoplasm of prostate (HCC)   Coronary artery disease   Essential hypertension   Dyslipidemia   Anasarca with bilateral pleural effusions, proteinuria, AKI,  ascites and hypoalbuminemia: - differential include CHF vs nephrotic syndrome vs liver cirrhosis. - echocardiogram ruled out decompensated CHF.  - renal biopsy ordered, nephrology on board for evaluation of nephrotic syndrome.  - diuresis with IV lasix.  - protein electrophoresis and urine electrophoresis pending.  - US liver to check for cirrhosis .     Hypertension:  - on hydralazine, imdur and lasix.    Hyperlipidemia Resume lipitor and zetia.   CAD;  No chest pain  On aspirin held.    Abnormal TSH.  Free t4 and t3 pending.    BPH:  On flomax.     DVT prophylaxis: (Heparin) Code Status: Partial code.  Family Communication: none at bedside.  Disposition:   Status is: Inpatient  Remains inpatient appropriate because:Ongoing diagnostic testing needed not appropriate for outpatient work up, Unsafe d/c plan, and IV treatments appropriate due to intensity of illness or inability to take PO  Dispo: The patient is from: Home              Anticipated d/c is  to:  pending.              Patient currently is not medically stable to d/c.   Difficult to place patient No       Consultants:  Nephrology.   Procedures:  US liver.  US RENAL.  ECHO. Severe concentric hypertrophy of  the left ventricle. Normal global wall motion. Normal LV systolic function  with EF 58%. Doppler evidence of grade I (impaired) diastolic dysfunction,  normal LAP. Left atrial cavity is mildly dilated.   Antimicrobials: none    Subjective: No new complaints.   Objective: Vitals:   08/16/21 0400 08/16/21 0554 08/16/21 0826 08/16/21 1141  BP: (!) 110/55 (!) 136/54 (!) 119/56 101/70  Pulse:  71 74 72  Resp:      Temp:   97.8 F (36.6 C) 97.7 F (36.5 C)  TempSrc:   Oral Oral  SpO2:  97% 92% 95%  Weight:      Height:        Intake/Output Summary (Last 24 hours) at 08/16/2021 1153 Last data filed at 08/16/2021 0829 Gross per 24 hour  Intake 300 ml  Output 650 ml  Net -350 ml   Filed Weights   08/14/21 1848 08/15/21 0429 08/16/21 0350  Weight: 119.2 kg 118.6 kg 118.7 kg    Examination:  General exam: Ill appearing gentleman, fluid overloaded,  Respiratory system: diminished air entry at bases, on RA, no rhonchi heard.  Cardiovascular system: S1 & S2 heard, RRR. JVD present. Edematous extremities.  Gastrointestinal system:  Abdomen is soft, distended, bowel sounds wnl, non tender.  Central nervous system: Alert and oriented. No focal neurological deficits. Extremities: edematous extremities.  Skin: No rashes seen  Psychiatry: Mood & affect appropriate.     Data Reviewed: I have personally reviewed following labs and imaging studies  CBC: Recent Labs  Lab 08/14/21 2118  WBC 8.2  HGB 11.0*  HCT 33.2*  MCV 87.4  PLT 831    Basic Metabolic Panel: Recent Labs  Lab 08/14/21 2118 08/15/21 0243 08/16/21 0404  NA 134* 135 136  K 5.2* 4.7 4.7  CL 106 105 106  CO2 23 23 22   GLUCOSE 83 88 82  BUN 61* 61* 65*  CREATININE 3.31* 3.17* 3.56*   CALCIUM 7.8* 7.6* 7.9*  PHOS  --   --  4.9*    GFR: Estimated Creatinine Clearance: 24.9 mL/min (A) (by C-G formula based on SCr of 3.56 mg/dL (H)).  Liver Function Tests: Recent Labs  Lab 08/14/21 2118 08/16/21 0404  AST 22  --   ALT 13  --   ALKPHOS 60  --   BILITOT 0.4  --   PROT 4.3*  --   ALBUMIN 1.7* 1.5*    CBG: Recent Labs  Lab 08/15/21 2142 08/16/21 0550  GLUCAP 332* 85     Recent Results (from the past 240 hour(s))  Resp Panel by RT-PCR (Flu A&B, Covid) Nasopharyngeal Swab     Status: None   Collection Time: 08/14/21  7:57 PM   Specimen: Nasopharyngeal Swab; Nasopharyngeal(NP) swabs in vial transport medium  Result Value Ref Range Status   SARS Coronavirus 2 by RT PCR NEGATIVE NEGATIVE Final    Comment: (NOTE) SARS-CoV-2 target nucleic acids are NOT DETECTED.  The SARS-CoV-2 RNA is generally detectable in upper respiratory specimens during the acute phase of infection. The lowest concentration of SARS-CoV-2 viral copies this assay can detect is 138 copies/mL. A negative result does not preclude SARS-Cov-2 infection and should not be used as the sole basis for treatment or other patient management decisions. A negative result may occur with  improper specimen collection/handling, submission of specimen other than nasopharyngeal swab, presence of viral mutation(s) within the areas targeted by this assay, and inadequate number of viral copies(<138 copies/mL). A negative result must be combined with clinical observations, patient history, and epidemiological information. The expected result is Negative.  Fact Sheet for Patients:  EntrepreneurPulse.com.au  Fact Sheet for Healthcare Providers:  IncredibleEmployment.be  This test is no t yet approved or cleared by the [place] FDA and  has been authorized for detection and/or diagnosis of SARS-CoV-2 by FDA under an Emergency Use Authorization (EUA). This EUA will  remain  in effect (meaning this test can be used) for the duration of the COVID-19 declaration under Section 564(b)(1) of the Act, 21 U.S.C.section 360bbb-3(b)(1), unless the authorization is terminated  or revoked sooner.       Influenza A by PCR NEGATIVE NEGATIVE Final   Influenza B by PCR NEGATIVE NEGATIVE Final    Comment: (NOTE) The Xpert Xpress SARS-CoV-2/FLU/RSV plus assay is intended as an aid in the diagnosis of influenza from Nasopharyngeal swab specimens and should not be used as a sole basis for treatment. Nasal washings and aspirates are unacceptable for Xpert Xpress SARS-CoV-2/FLU/RSV testing.  Fact Sheet for Patients: EntrepreneurPulse.com.au  Fact Sheet for Healthcare Providers: IncredibleEmployment.be  This test is not yet approved or cleared by the [place] FDA and has been authorized for detection and/or diagnosis of SARS-CoV-2 by FDA  under an Emergency Use Authorization (EUA). This EUA will remain in effect (meaning this test can be used) for the duration of the COVID-19 declaration under Section 564(b)(1) of the Act, 21 U.S.C. section 360bbb-3(b)(1), unless the authorization is terminated or revoked.  Performed at [facility], [place] [address] [place], [place] [address]   Urine Culture     Status: Abnormal   Collection Time: 08/15/21  7:56 AM   Specimen: Urine, Clean Catch  Result Value Ref Range Status   Specimen Description URINE, CLEAN CATCH  Final   Special Requests   Final    Normal Performed at [facility], [place] [address], [place], [place] [address]    Culture MULTIPLE SPECIES PRESENT, SUGGEST RECOLLECTION (A)  Final   Report Status 08/16/2021 FINAL  Final         Radiology Studies: DG Chest 2 View  Result Date: 08/14/2021 CLINICAL DATA:  Dyspnea. EXAM: CHEST - 2 VIEW COMPARISON:  Chest x-ray 08/08/2021. FINDINGS: Heart is enlarged, unchanged. There are atherosclerotic calcifications  of the aorta. There are small bilateral pleural effusions, increasing from prior study. There is no evidence for pneumothorax. There some strandy opacities at the lung bases favored is atelectasis. No acute fractures are seen. IMPRESSION: 1. Increasing small bilateral pleural effusions. 2. Stable cardiomegaly. Electronically Signed   By: Ronney Asters M.D.   On: 08/14/2021 21:15   US RENAL  Result Date: 08/15/2021 CLINICAL DATA:  Nephrotic syndrome EXAM: RENAL / URINARY TRACT ULTRASOUND COMPLETE COMPARISON:  None. FINDINGS: Right Kidney: Renal measurements: 10.7 x 4.7 x 5.5 cm. = volume: 144 mL. Echogenicity within normal limits. No mass or hydronephrosis visualized. Left Kidney: Renal measurements: 11.2 x 6.8 x 5.2 cm. = volume: 208 mL. 2.7 cm lower pole cyst is noted in the left kidney. No mass lesion or hydronephrosis is noted. Bladder: Partially decompressed. The wall is thickened although likely related to the decompression. Other: Diffuse ascites is noted within the abdomen and pelvis. IMPRESSION: Diffuse ascites within the abdomen and pelvis. Normal-appearing kidneys with the exception of a left lower pole renal cyst. Electronically Signed   By: Inez Catalina M.D.   On: 08/15/2021 20:02        Scheduled Meds:  atorvastatin  40 mg Oral Daily   ezetimibe  10 mg Oral QPC supper   furosemide  80 mg Intravenous TID WC & HS   heparin  5,000 Units Subcutaneous Q8H   hydrALAZINE  25 mg Oral Q8H   isosorbide mononitrate  60 mg Oral Daily   labetalol  100 mg Oral BID   sodium chloride flush  3 mL Intravenous Q12H   sodium chloride flush  3 mL Intravenous Q12H   tamsulosin  0.4 mg Oral Daily   Continuous Infusions:  sodium chloride     sodium chloride     sodium chloride     sodium chloride       LOS: 2 days        Hosie Poisson, MD [facility]   To contact the attending provider between 7A-7P or the covering provider during after hours 7P-7A, please log into the web site  [email or web address] and access using universal [place] password for that web site. If you do not have the password, please call the hospital operator.  08/16/2021, 11:53 AM

## 2021-08-16 NOTE — Progress Notes (Signed)
[facility] Progress Note  Assessment/Plan **AKI:  clinical picture most concerning for nephrotic syndrome with AKI though UP/C showing non nephrotic proteinuria with UP/C 1.65, lipids ok.  Renal US normal size and echogenicity.  Checking renal vein dopplers, serologies, and plans for IR US guided renal biopsy after ASA off x 3 days - likely Monday inpt.  Will check urine sodium and FeNa as well.  Hold ARB, avoid nephrotoxins.     **Proteinuria:  3+ on UA, UP/C 1.65  Holding ARB in setting of AKI for now.  W/u per above.   **Anasarca:  low na diet.  Increase lasix from 60q6 to 80q8 given suboptimal diuresis.  Strict I/Os, daily weights.  Eval underlying issue per above.  Given nonnephrotic proteinuria would recommend obtaining imaging of liver to eval for cirrhosis.   **DM:  per primary.     **h/o prostatitis:  asymptomatic currently but UA suggestive of infection - send culture.  I don't think this should preclude kidney biopsy.    Will follow closely, call with concerns.  ________________________________________________________________________________ Subjective:   feels ok but didn't really diurese with lasix 60 IV q6h yesterday.  No new symptoms. No h/o liver disease.   Objective Vitals:   08/16/21 0350 08/16/21 0400 08/16/21 0554 08/16/21 0826  BP: 96/73 (!) 110/55 (!) 136/54 (!) 119/56  Pulse: 86  71 74  Resp: 18     Temp: 98 F (36.7 C)   97.8 F (36.6 C)  TempSrc: Oral   Oral  SpO2: 96%  97% 92%  Weight: 118.7 kg     Height:       Physical Exam Gen: comfortable in bed  Eyes: anicteric, EOMI, +glasses ENT: MMM, no oral ulcers Neck: supple CV:  RRR, no rub Abd: soft, +abd wall edema, mod distended, nontender Lungs: normal WOB, rhonchi scattered L field, R fairly clear GU: no foley Extr:  2-3+ pitting edema throughout Neuro: nonfocal Skin: no rashes, has some excoriations and cracks without weeping on RLE  Additional Objective Labs: Basic Metabolic  Panel: Recent Labs  Lab 08/14/21 2118 08/15/21 0243 08/16/21 0404  NA 134* 135 136  K 5.2* 4.7 4.7  CL 106 105 106  CO2 23 23 22   GLUCOSE 83 88 82  BUN 61* 61* 65*  CREATININE 3.31* 3.17* 3.56*  CALCIUM 7.8* 7.6* 7.9*  PHOS  --   --  4.9*   Liver Function Tests: Recent Labs  Lab 08/14/21 2118 08/16/21 0404  AST 22  --   ALT 13  --   ALKPHOS 60  --   BILITOT 0.4  --   PROT 4.3*  --   ALBUMIN 1.7* 1.5*   No results for input(s): LIPASE, AMYLASE in the last 168 hours. CBC: Recent Labs  Lab 08/14/21 2118  WBC 8.2  HGB 11.0*  HCT 33.2*  MCV 87.4  PLT 291   Blood Culture    Component Value Date/Time   SDES URINE, CLEAN CATCH 08/15/2021 0756   SPECREQUEST  08/15/2021 0756    Normal Performed at [facility], [place] [address] [place], [place] [address]    CULT MULTIPLE SPECIES PRESENT, SUGGEST RECOLLECTION (A) 08/15/2021 0756   REPTSTATUS 08/16/2021 FINAL 08/15/2021 0756    Cardiac Enzymes: No results for input(s): CKTOTAL, CKMB, CKMBINDEX, TROPONINI in the last 168 hours. CBG: Recent Labs  Lab 08/15/21 2142 08/16/21 0550  GLUCAP 332* 85   Iron Studies: No results for input(s): IRON, TIBC, TRANSFERRIN, FERRITIN in the last 72  hours. @lablastinr3 @ Studies/Results: DG Chest 2 View  Result Date: 08/14/2021 CLINICAL DATA:  Dyspnea. EXAM: CHEST - 2 VIEW COMPARISON:  Chest x-ray 08/08/2021. FINDINGS: Heart is enlarged, unchanged. There are atherosclerotic calcifications of the aorta. There are small bilateral pleural effusions, increasing from prior study. There is no evidence for pneumothorax. There some strandy opacities at the lung bases favored is atelectasis. No acute fractures are seen. IMPRESSION: 1. Increasing small bilateral pleural effusions. 2. Stable cardiomegaly. Electronically Signed   By: Ronney Asters M.D.   On: 08/14/2021 21:15   US RENAL  Result Date: 08/15/2021 CLINICAL DATA:  Nephrotic syndrome EXAM: RENAL / URINARY TRACT ULTRASOUND  COMPLETE COMPARISON:  None. FINDINGS: Right Kidney: Renal measurements: 10.7 x 4.7 x 5.5 cm. = volume: 144 mL. Echogenicity within normal limits. No mass or hydronephrosis visualized. Left Kidney: Renal measurements: 11.2 x 6.8 x 5.2 cm. = volume: 208 mL. 2.7 cm lower pole cyst is noted in the left kidney. No mass lesion or hydronephrosis is noted. Bladder: Partially decompressed. The wall is thickened although likely related to the decompression. Other: Diffuse ascites is noted within the abdomen and pelvis. IMPRESSION: Diffuse ascites within the abdomen and pelvis. Normal-appearing kidneys with the exception of a left lower pole renal cyst. Electronically Signed   By: Inez Catalina M.D.   On: 08/15/2021 20:02   Medications:  sodium chloride     sodium chloride     sodium chloride     sodium chloride      atorvastatin  40 mg Oral Daily   ezetimibe  10 mg Oral QPC supper   furosemide  80 mg Intravenous TID WC & HS   heparin  5,000 Units Subcutaneous Q8H   hydrALAZINE  25 mg Oral Q8H   isosorbide mononitrate  60 mg Oral Daily   labetalol  100 mg Oral BID   sodium chloride flush  3 mL Intravenous Q12H   sodium chloride flush  3 mL Intravenous Q12H   tamsulosin  0.4 mg Oral Daily

## 2021-08-16 NOTE — Evaluation (Signed)
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 PMH: HTN, DM2, prostate ca, tobacco use, carotid stenosis, non obstructive CAD, glaucoma, arthritis and MI.   Clinical Impression   Pt was independent and a caregiver of his wife prior to admission. He presents with generalized weakness, impaired standing balance and decreased activity tolerance. Reaching his LEs for ADL is difficult due to swelling. Will follow acutely.      Recommendations for follow up therapy are one component of a multi-disciplinary discharge planning process, led by the attending physician.  Recommendations may be updated based on patient status, additional functional criteria and insurance authorization.   Follow Up Recommendations  Home health OT (depending on progress, may not require)    Equipment Recommendations  None recommended by OT    Recommendations for Other Services       Precautions / Restrictions Precautions Precautions: Fall      Mobility Bed Mobility               General bed mobility comments: NT    Transfers Overall transfer level: Needs assistance Equipment used: Rolling walker (2 wheeled) Transfers: Sit to/from Stand Sit to Stand: Supervision         General transfer comment: cues for hand placement    Balance Overall balance assessment: Needs assistance   Sitting balance-Leahy Scale: Good     Standing balance support: During functional activity Standing balance-Leahy Scale: Fair                             ADL either performed or assessed with clinical judgement   ADL Overall ADL's : Needs assistance/impaired Eating/Feeding: Independent;Sitting   Grooming: Supervision/safety;Standing;Wash/dry hands   Upper  Body Bathing: Set up;Sitting   Lower Body Bathing: Sit to/from stand;Moderate assistance   Upper Body Dressing : Set up;Sitting   Lower Body Dressing: Moderate assistance;Sit to/from stand   Toilet Transfer: Supervision/safety;Ambulation;RW   Toileting- Clothing Manipulation and Hygiene: Supervision/safety;Sit to/from stand       Functional mobility during ADLs: Supervision/safety;Rolling walker General ADL Comments: Began educating pt in energy conservation strategies, he is interested in AE for LB ADL.     Vision Baseline Vision/History: 1 Wears glasses;3 Glaucoma Ability to See in Adequate Light: 0 Adequate Patient Visual Report: No change from baseline       Perception     Praxis      Pertinent Vitals/Pain Pain Assessment: Faces Faces Pain Scale: No hurt     Hand Dominance Right   Extremity/Trunk Assessment Upper Extremity Assessment Upper Extremity Assessment: Overall WFL for tasks assessed (edematous)   Lower Extremity Assessment Lower Extremity Assessment: Defer to PT evaluation   Cervical / Trunk Assessment Cervical / Trunk Assessment: Other exceptions Cervical / Trunk Exceptions: abdominal swelling   Communication Communication Communication: No difficulties   Cognition Arousal/Alertness: Awake/alert Behavior During Therapy: WFL for tasks assessed/performed Overall Cognitive Status: Within Functional Limits for tasks assessed                                     General Comments       Exercises     Shoulder Instructions  Home Living Family/patient expects to be discharged to:: Private residence Living Arrangements: Spouse/significant other (spous is not in good health) Available Help at Discharge: Family;Available 24 hours/day Type of Home: House Home Access: Ramped entrance     Home Layout: Two level;Able to live on main level with bedroom/bathroom Alternate Level Stairs-Number of Steps: 17 Alternate Level  Stairs-Rails: Right Bathroom Shower/Tub: Occupational psychologist: Handicapped height Bathroom Accessibility: Yes   Home Equipment: Toilet riser;Walker - 2 wheels;Walker - 4 wheels;Wheelchair - manual;Hand held shower head;Grab bars - tub/shower;Shower seat          Prior Functioning/Environment Level of Independence: Independent        Comments: walking without AD, assisting his wife, has a caregiver staying with her while he is hospitalized        OT Problem List: Impaired balance (sitting and/or standing);Decreased activity tolerance;Decreased knowledge of use of DME or AE;Cardiopulmonary status limiting activity      OT Treatment/Interventions: Self-care/ADL training;DME and/or AE instruction;Energy conservation;Therapeutic activities;Patient/family education;Balance training    OT Goals(Current goals can be found in the care plan section) Acute Rehab OT Goals Patient Stated Goal: return home OT Goal Formulation: With patient Time For Goal Achievement: 08/30/21 Potential to Achieve Goals: Good ADL Goals Pt Will Perform Grooming: with modified independence;standing Pt Will Perform Lower Body Bathing: with modified independence;with adaptive equipment;sit to/from stand Pt Will Perform Lower Body Dressing: with modified independence;sit to/from stand;with adaptive equipment Pt Will Transfer to Toilet: with modified independence;ambulating Pt Will Perform Toileting - Clothing Manipulation and hygiene: with modified independence;sit to/from stand Pt Will Perform Tub/Shower Transfer: with supervision;ambulating;shower seat;rolling walker;Shower transfer Additional ADL Goal #1: Pt will generalize energy conservation strategies in ADL and mobility independently.  OT Frequency: Min 2X/week   Barriers to D/C:            Co-evaluation              AM-PAC OT "6 Clicks" Daily Activity     Outcome Measure Help from another person eating meals?: None Help from  another person taking care of personal grooming?: A Little Help from another person toileting, which includes using toliet, bedpan, or urinal?: A Little Help from another person bathing (including washing, rinsing, drying)?: A Lot Help from another person to put on and taking off regular upper body clothing?: None Help from another person to put on and taking off regular lower body clothing?: A Lot 6 Click Score: 18   End of Session Equipment Utilized During Treatment: Rolling walker  Activity Tolerance: Patient tolerated treatment well Patient left: in bed (EOB)  OT Visit Diagnosis: Other abnormalities of gait and mobility (R26.89)                Time: 1550-1610 OT Time Calculation (min): 20 min Charges:  OT General Charges $OT Visit: 1 Visit OT Evaluation $OT Eval Moderate Complexity: 1 Mod  Nestor Lewandowsky, OTR/L Acute Rehabilitation Services Pager: [phone] Office: [phone]   Malka So 08/16/2021, 4:17 PM

## 2021-08-16 NOTE — Progress Notes (Signed)
   Pt was scheduled for Renal bx today in IR Pt is on ASA--- last dose yesterday at 1000 am Must be held 3 days   I will discuss with MD Earliest now for Bx is Monday 10/10 /22 Or Can be done as OP   Pt is aware and agreeable

## 2021-08-16 NOTE — Progress Notes (Signed)
Heart Failure Navigator Progress Note  Assessed for Heart & Vascular TOC clinic readiness.  Patient does not meet criteria due to work up for nephrotic syndrome.   Navigator available for reassessment of patient.   Pricilla Holm, MSN, RN Heart Failure Nurse Navigator [phone]

## 2021-08-16 NOTE — Progress Notes (Signed)
Renal vein duplex has been completed. Preliminary results can be found in CV Proc through chart review.   08/16/21 1:07 PM Corey Beard RVT

## 2021-08-17 ENCOUNTER — Inpatient Hospital Stay (HOSPITAL_COMMUNITY): Payer: Medicare PPO

## 2021-08-17 DIAGNOSIS — N179 Acute kidney failure, unspecified: Secondary | ICD-10-CM | POA: Diagnosis not present

## 2021-08-17 DIAGNOSIS — N049 Nephrotic syndrome with unspecified morphologic changes: Secondary | ICD-10-CM | POA: Diagnosis not present

## 2021-08-17 DIAGNOSIS — K7460 Unspecified cirrhosis of liver: Secondary | ICD-10-CM | POA: Diagnosis not present

## 2021-08-17 DIAGNOSIS — R188 Other ascites: Secondary | ICD-10-CM | POA: Diagnosis not present

## 2021-08-17 LAB — HEPATITIS B SURFACE ANTIGEN: Hepatitis B Surface Ag: NONREACTIVE

## 2021-08-17 LAB — KAPPA/LAMBDA LIGHT CHAINS
Kappa free light chain: 79.6 mg/L — ABNORMAL HIGH (ref 3.3–19.4)
Kappa, lambda light chain ratio: 0.03 — ABNORMAL LOW (ref 0.26–1.65)
Lambda free light chains: 2640.4 mg/L — ABNORMAL HIGH (ref 5.7–26.3)

## 2021-08-17 LAB — COMPREHENSIVE METABOLIC PANEL
ALT: 12 U/L (ref 0–44)
AST: 19 U/L (ref 15–41)
Albumin: 1.6 g/dL — ABNORMAL LOW (ref 3.5–5.0)
Alkaline Phosphatase: 63 U/L (ref 38–126)
Anion gap: 9 (ref 5–15)
BUN: 68 mg/dL — ABNORMAL HIGH (ref 8–23)
CO2: 21 mmol/L — ABNORMAL LOW (ref 22–32)
Calcium: 8.1 mg/dL — ABNORMAL LOW (ref 8.9–10.3)
Chloride: 105 mmol/L (ref 98–111)
Creatinine, Ser: 3.77 mg/dL — ABNORMAL HIGH (ref 0.61–1.24)
GFR, Estimated: 16 mL/min — ABNORMAL LOW (ref >60)
Glucose, Bld: 92 mg/dL (ref 70–99)
Potassium: 5 mmol/L (ref 3.5–5.1)
Sodium: 135 mmol/L (ref 135–145)
Total Bilirubin: 0.5 mg/dL (ref 0.3–1.2)
Total Protein: 4.4 g/dL — ABNORMAL LOW (ref 6.5–8.1)

## 2021-08-17 LAB — C3 COMPLEMENT: C3 Complement: 98 mg/dL (ref 82–167)

## 2021-08-17 LAB — IRON AND TIBC
Iron: 28 ug/dL — ABNORMAL LOW (ref 45–182)
Saturation Ratios: 17 % — ABNORMAL LOW (ref 17.9–39.5)
TIBC: 167 ug/dL — ABNORMAL LOW (ref 250–450)
UIBC: 139 ug/dL

## 2021-08-17 LAB — GLUCOSE 6 PHOSPHATE DEHYDROGENASE
G6PDH: 10.3 U/g{Hb} (ref 4.8–15.7)
Hemoglobin: 8.9 g/dL — ABNORMAL LOW (ref 13.0–17.7)

## 2021-08-17 LAB — PTH, INTACT AND CALCIUM
Calcium, Total (PTH): 7.9 mg/dL — ABNORMAL LOW (ref 8.6–10.2)
PTH: 27 pg/mL (ref 15–65)

## 2021-08-17 LAB — T3, FREE: T3, Free: 1.3 pg/mL — ABNORMAL LOW (ref 2.0–4.4)

## 2021-08-17 LAB — C4 COMPLEMENT: Complement C4, Body Fluid: 32 mg/dL (ref 12–38)

## 2021-08-17 LAB — HEPATITIS B SURFACE ANTIBODY, QUANTITATIVE: Hep B S AB Quant (Post): <3.1 m[IU]/mL — ABNORMAL LOW (ref >9.9)

## 2021-08-17 LAB — HEPATITIS A ANTIBODY, TOTAL: hep A Total Ab: REACTIVE — AB

## 2021-08-17 MED ORDER — ALBUMIN HUMAN 25 % IV SOLN
25.0000 g | Freq: Three times a day (TID) | INTRAVENOUS | Status: DC
  Administered 2021-08-17 – 2021-08-20 (×9): 25 g via INTRAVENOUS
  Filled 2021-08-17 (×11): qty 100

## 2021-08-17 MED ORDER — FUROSEMIDE 10 MG/ML IJ SOLN
120.0000 mg | Freq: Three times a day (TID) | INTRAVENOUS | Status: DC
  Administered 2021-08-17 – 2021-08-19 (×9): 120 mg via INTRAVENOUS
  Filled 2021-08-17: qty 2
  Filled 2021-08-17 (×3): qty 10
  Filled 2021-08-17: qty 12
  Filled 2021-08-17 (×5): qty 10
  Filled 2021-08-17: qty 12

## 2021-08-17 NOTE — Care Management Important Message (Signed)
Important Message  Patient Details  Name: Corey Beard MRN: [id number] Date of Birth: [birth year]   Medicare Important Message Given:  Yes     Corey Beard 08/17/2021, 11:41 AM

## 2021-08-17 NOTE — Progress Notes (Signed)
Weeping and blisters noted to LUE, kerlex and gauze placed over multiple sites.

## 2021-08-17 NOTE — Progress Notes (Signed)
PROGRESS NOTE    Corey Beard  KGM:[id number] DOB: 02/20/1946 DOA: 08/14/2021 PCP: Benito Mccreedy, MD    No chief complaint on file.   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 He is found to have bilateral pleural effusions R>L, with recent 2 L thoracentesis performed on 08/07/2021. Cr is up to 3.3 from baseline of 1.2-1.3 a year ago. He has 3+ proteinuria.  He was initially admitted by cardiology, transferred to [facility].  Nephrology consulted for evaluation of nephrotic syndrome. Further evaluation revealed cirrhosis of the liver.  GI consulted for evaluation of hepatorenal syndrome.  Assessment & Plan:   Principal Problem:   Nephrotic syndrome Active Problems:   Malignant neoplasm of prostate (HCC)   Coronary artery disease   Essential hypertension   Dyslipidemia   Anasarca with bilateral pleural effusions, proteinuria, AKI,  ascites and hypoalbuminemia: - differential include CHF vs nephrotic syndrome vs liver cirrhosis. - echocardiogram ruled out decompensated CHF.  - US renal Normal-appearing kidneys with the exception of a left lower pole renal cyst. - Renal biopsy ordered scheduled for Monday as patient has been on aspirin., nephrology on board for evaluation of nephrotic syndrome.  - diuresis with IV lasix 120 mg 3 times daily along with albumin infusions - protein electrophoresis and urine electrophoresis pending.  Complements are wnl. PTH is intact. Rpr is non reactive.  - US liver reveals cirrhosis, will follow up with an MRI with liver protocol. - Meanwhile GI consulted for new diagnosis of cirrhosis.   AKI:  ? Nephrotic syndrome vs HRS  Creatinine at baseline around 1.2 to 1.3, admitted with a  creatinine around 3.3 <3.56 <3.77.  Potassium around 5.  Urine output around      Hypertension:  - on hydralazine, imdur and lasix.    Hyperlipidemia Resume lipitor and zetia.   CAD;  No chest pain  On aspirin held.    Abnormal TSH.  TSH is 7.659, free T3 is 1.3 and free t4 wnl.     BPH:  On flomax.   Abnormal UA with urine culture showing multiple species. Repeat cultures are ordered.  Did not start the patient on IV antibiotics as pt is asymptomatic.   Bilateral upper and lower extremity edema from the anasarca with weeping  - IV albumin infusions ordered.    DVT prophylaxis: (Heparin) Code Status: Partial code.  Family Communication: none at bedside.  Disposition:   Status is: Inpatient  Remains inpatient appropriate because:Ongoing diagnostic testing needed not appropriate for outpatient work up, Unsafe d/c plan, and IV treatments appropriate due to intensity of illness or inability to take PO  Dispo: The patient is from: Home              Anticipated d/c is to:  pending.              Patient currently is not medically stable to d/c.   Difficult to place patient No       Consultants:  Nephrology.  Gastroenterology   Procedures:  US liver.  US RENAL.  ECHO. Severe concentric hypertrophy of  the left ventricle. Normal global wall motion. Normal LV systolic function  with EF 58%. Doppler evidence of grade I (impaired) diastolic dysfunction,  normal LAP. Left atrial cavity is mildly dilated.   Antimicrobials: none    Subjective: Reports feeling the same.   Objective:  Vitals:   08/16/21 1936 08/17/21 0459 08/17/21 0500 08/17/21 0824  BP: (!) 125/55 (!) 113/57 (!) 113/57 137/90  Pulse: 79  83 81  Resp:   18   Temp: (!) 97.5 F (36.4 C) 98.3 F (36.8 C) 98.3 F (36.8 C)   TempSrc: Oral Oral Oral   SpO2: 92% 96% 96% 95%  Weight:  117.5 kg    Height:        Intake/Output Summary (Last 24 hours) at 08/17/2021 1121 Last data filed at 08/17/2021 4650 Gross per 24 hour  Intake 360 ml  Output 225 ml  Net 135 ml     Filed Weights   08/15/21 0429 08/16/21 0350 08/17/21 0459  Weight: 118.6 kg 118.7 kg 117.5 kg    Examination:  General exam: Ill-appearing gentleman on room air and not in any kind of distress Respiratory system: Diminished air entry at bases, no wheezing or tachypnea on room air Cardiovascular system: S1 & S2 heard, RRR.  JVD cannot be appreciated, edematous  extremities present Gastrointestinal system: Abdomen is soft, distended , non tender , bowel sounds wnl.  Central nervous system: Alert and oriented. No focal neurological deficits. Extremities: Edematous upper and lower extremities Skin: No rashes, lesions or ulcers Psychiatry: Mood & affect appropriate.      Data Reviewed: I have personally reviewed following labs and imaging studies  CBC: Recent Labs  Lab 08/14/21 2118  WBC 8.2  HGB 11.0*  HCT 33.2*  MCV 87.4  PLT 291     Basic Metabolic Panel: Recent Labs  Lab 08/14/21 2118 08/15/21 0243 08/16/21 0404 08/17/21 0221  NA 134* 135 136 135  K 5.2* 4.7 4.7 5.0  CL 106 105 106 105  CO2 23 23 22  21*  GLUCOSE 83 88 82 92  BUN 61* 61* 65* 68*  CREATININE 3.31* 3.17* 3.56* 3.77*  CALCIUM 7.8* 7.6* 7.9*  7.9* 8.1*  PHOS  --   --  4.9*  --      GFR: Estimated Creatinine Clearance: 23.4 mL/min (A) (by C-G formula based on SCr of 3.77 mg/dL (H)).  Liver Function Tests: Recent Labs  Lab 08/14/21 2118 08/16/21 0404 08/17/21 0221  AST 22  --  19  ALT 13  --  12  ALKPHOS 60  --  63  BILITOT 0.4  --  0.5  PROT 4.3*  --  4.4*  ALBUMIN 1.7* 1.5* 1.6*     CBG: Recent Labs  Lab 08/15/21 2142 08/16/21 0550  GLUCAP 332* 85      Recent Results (from the past 240 hour(s))  Resp Panel by RT-PCR (Flu A&B, Covid) Nasopharyngeal Swab     Status: None   Collection Time: 08/14/21  7:57 PM   Specimen: Nasopharyngeal Swab; Nasopharyngeal(NP) swabs in vial transport medium  Result Value Ref Range Status   SARS Coronavirus 2 by RT PCR NEGATIVE NEGATIVE  Final    Comment: (NOTE) SARS-CoV-2 target nucleic acids are NOT DETECTED.  The SARS-CoV-2 RNA is generally detectable in upper respiratory specimens during the acute phase of infection. The lowest concentration of SARS-CoV-2 viral copies this assay can detect is 138 copies/mL. A negative result does not preclude SARS-Cov-2 infection and should not be used as the sole basis for treatment or other patient management decisions. A negative result may occur with  improper specimen collection/handling, submission of specimen other than nasopharyngeal swab, presence of viral mutation(s) within the areas targeted by this assay, and inadequate number of viral copies(<138 copies/mL). A negative result  must be combined with clinical observations, patient history, and epidemiological information. The expected result is Negative.  Fact Sheet for Patients:  EntrepreneurPulse.com.au  Fact Sheet for Healthcare Providers:  IncredibleEmployment.be  This test is no t yet approved or cleared by the [place] FDA and  has been authorized for detection and/or diagnosis of SARS-CoV-2 by FDA under an Emergency Use Authorization (EUA). This EUA will remain  in effect (meaning this test can be used) for the duration of the COVID-19 declaration under Section 564(b)(1) of the Act, 21 U.S.C.section 360bbb-3(b)(1), unless the authorization is terminated  or revoked sooner.       Influenza A by PCR NEGATIVE NEGATIVE Final   Influenza B by PCR NEGATIVE NEGATIVE Final    Comment: (NOTE) The Xpert Xpress SARS-CoV-2/FLU/RSV plus assay is intended as an aid in the diagnosis of influenza from Nasopharyngeal swab specimens and should not be used as a sole basis for treatment. Nasal washings and aspirates are unacceptable for Xpert Xpress SARS-CoV-2/FLU/RSV testing.  Fact Sheet for Patients: EntrepreneurPulse.com.au  Fact Sheet for Healthcare  Providers: IncredibleEmployment.be  This test is not yet approved or cleared by the [place] FDA and has been authorized for detection and/or diagnosis of SARS-CoV-2 by FDA under an Emergency Use Authorization (EUA). This EUA will remain in effect (meaning this test can be used) for the duration of the COVID-19 declaration under Section 564(b)(1) of the Act, 21 U.S.C. section 360bbb-3(b)(1), unless the authorization is terminated or revoked.  Performed at [facility], [place] [address] [place], [place] [address]   Urine Culture     Status: Abnormal   Collection Time: 08/15/21  7:56 AM   Specimen: Urine, Clean Catch  Result Value Ref Range Status   Specimen Description URINE, CLEAN CATCH  Final   Special Requests   Final    Normal Performed at [facility], [place] [address], [place], [place] [address]    Culture MULTIPLE SPECIES PRESENT, SUGGEST RECOLLECTION (A)  Final   Report Status 08/16/2021 FINAL  Final          Radiology Studies: US RENAL  Result Date: 08/15/2021 CLINICAL DATA:  Nephrotic syndrome EXAM: RENAL / URINARY TRACT ULTRASOUND COMPLETE COMPARISON:  None. FINDINGS: Right Kidney: Renal measurements: 10.7 x 4.7 x 5.5 cm. = volume: 144 mL. Echogenicity within normal limits. No mass or hydronephrosis visualized. Left Kidney: Renal measurements: 11.2 x 6.8 x 5.2 cm. = volume: 208 mL. 2.7 cm lower pole cyst is noted in the left kidney. No mass lesion or hydronephrosis is noted. Bladder: Partially decompressed. The wall is thickened although likely related to the decompression. Other: Diffuse ascites is noted within the abdomen and pelvis. IMPRESSION: Diffuse ascites within the abdomen and pelvis. Normal-appearing kidneys with the exception of a left lower pole renal cyst. Electronically Signed   By: Inez Catalina M.D.   On: 08/15/2021 20:02   VAS US RENAL ARTERY DUPLEX  Result Date: 08/16/2021 ABDOMINAL VISCERAL Patient Name:  BEE HAMMERSCHMIDT  [facility]  Date of Exam:   08/16/2021 Medical Rec #: [id number]         Accession #:    [id number] Date of Birth: Aug 09, 1946          Patient Gender: M Patient Age:   62 years Exam Location:  [facility] Procedure:      VAS US RENAL ARTERY DUPLEX Referring Phys: [id number] LINDSAY A KRUSKA -------------------------------------------------------------------------------- Indications: AKI High Risk Factors: Hypertension, Diabetes. Limitations: Limited study. MD only wanted  the renal veins to assess patency. Comparison Study: No prior studies. Performing Technologist: Oliver Hum RVT  Examination Guidelines: A complete evaluation includes B-mode imaging, spectral Doppler, color Doppler, and power Doppler as needed of all accessible portions of each vessel. Bilateral testing is considered an integral part of a complete examination. Limited examinations for reoccurring indications may be performed as noted.  Duplex Findings:  Technologist observations: The right and left renal veins appear patent.  Summary:  *See table(s) above for measurements and observations.  Diagnosing physician: Harold Barban MD  Electronically signed by Harold Barban MD on 08/16/2021 at 4:50:07 PM.    Final    US Abdomen Limited RUQ (LIVER/GB)  Result Date: 08/16/2021 CLINICAL DATA:  Cirrhosis/ascites EXAM: ULTRASOUND ABDOMEN LIMITED RIGHT UPPER QUADRANT COMPARISON:  None. FINDINGS: Gallbladder: No gallstones or wall thickening visualized. No sonographic Murphy sign noted by sonographer. Common bile duct: Diameter: 1.7 mL Liver: Nodular hepatic contour. No focal lesion identified. Decreased parenchymal echogenicity. Portal vein is patent on color Doppler imaging with normal direction of blood flow towards the liver. Other: At least small volume free fluid. At least trace volume right pleural effusion. IMPRESSION: 1. Cirrhosis. Recommend MRI liver protocol for further evaluation of the hepatic parenchyma. When the patient is clinically stable  and able to follow directions and hold their breath (preferably as an outpatient) further evaluation with dedicated abdominal MRI should be considered. 2. At least small volume ascites. 3. At least trace volume right pleural effusion. Electronically Signed   By: Iven Finn M.D.   On: 08/16/2021 18:55        Scheduled Meds:  atorvastatin  40 mg Oral Daily   ezetimibe  10 mg Oral QPC supper   heparin  5,000 Units Subcutaneous Q8H   hydrALAZINE  25 mg Oral Q8H   isosorbide mononitrate  60 mg Oral Daily   labetalol  100 mg Oral BID   sodium chloride flush  3 mL Intravenous Q12H   sodium chloride flush  3 mL Intravenous Q12H   tamsulosin  0.4 mg Oral Daily   Continuous Infusions:  sodium chloride     sodium chloride     sodium chloride     sodium chloride     albumin human 25 g (08/17/21 0916)   furosemide       LOS: 3 days        Hosie Poisson, MD [facility]   To contact the attending provider between 7A-7P or the covering provider during after hours 7P-7A, please log into the web site [email or web address] and access using universal [place] password for that web site. If you do not have the password, please call the hospital operator.  08/17/2021, 11:21 AM

## 2021-08-17 NOTE — Progress Notes (Signed)
[facility] Progress Note  Assessment/Plan **AKI:  clinical picture initially most concerning for nephrotic syndrome with AKI though UP/C showing non nephrotic proteinuria with UP/C 1.65, lipids ok and now has new dx of cirrhosis.  Renal US normal size and echogenicity.  Renal vein dopplers ok.  Pending numerous serologies, and plans for IR US guided renal biopsy after ASA off x 3 days - likely Monday inpt however if serologic eval is unrevealing I'm less included to do a renal biopsy as this may be HRS.  Urine sodium is not low but he's receiving obligatory diuretics. Will continue to hold ASA in case renal biopsy remains necessary.  Hold ARB, avoid nephrotoxins.     **Proteinuria:  3+ on UA, UP/C 1.65  Holding ARB in setting of AKI for now.  W/u per above.  **Cirrhosis:  new dx.  No h/o liver dz.  Would involve GI for eval given I'm concerned the anasarca may be primarily driven by cirrhosis.   **Anasarca:  low na diet.  Increase lasix from 80q8 to 120q8 and add albumin IV prior to doses given suboptimal diuresis. Next step would be to max lasix and add thiazide.  Strict I/Os, daily weights.  Eval underlying issue per above.     **DM:  per primary.     **h/o prostatitis:  asymptomatic currently but UA suggestive of infection - culture initially multiple species, culture resent.     Will follow closely, call with concerns.  ________________________________________________________________________________ Subjective:   feels ok but didn't really diurese with lasix 80 IV q8h yesterday.  No new symptoms. No h/o liver disease but imaging showing cirrhosis. Having some dyspnea now.    Objective Vitals:   08/16/21 1617 08/16/21 1936 08/17/21 0459 08/17/21 0500  BP: (!) 120/58 (!) 125/55 (!) 113/57 (!) 113/57  Pulse: 78 79  83  Resp:    18  Temp:  (!) 97.5 F (36.4 C) 98.3 F (36.8 C) 98.3 F (36.8 C)  TempSrc:  Oral Oral Oral  SpO2: 92% 92% 96% 96%  Weight:   117.5 kg    Height:       Physical Exam Gen: comfortable in bed  Eyes: anicteric, EOMI, +glasses ENT: MMM, no oral ulcers Neck: supple CV:  RRR, no rub Abd: soft, +abd wall edema, mod distended, nontender Lungs: normal WOB sitting at rest, diminished BL 1/3 lung fields GU: no foley Extr:  2-3+ pitting edema throughout Neuro: nonfocal Skin: no rashes, has some denuded skin L AC fossa and some small blisters noted L forearm  Additional Objective Labs: Basic Metabolic Panel: Recent Labs  Lab 08/15/21 0243 08/16/21 0404 08/17/21 0221  NA 135 136 135  K 4.7 4.7 5.0  CL 105 106 105  CO2 23 22 21*  GLUCOSE 88 82 92  BUN 61* 65* 68*  CREATININE 3.17* 3.56* 3.77*  CALCIUM 7.6* 7.9*  7.9* 8.1*  PHOS  --  4.9*  --     Liver Function Tests: Recent Labs  Lab 08/14/21 2118 08/16/21 0404 08/17/21 0221  AST 22  --  19  ALT 13  --  12  ALKPHOS 60  --  63  BILITOT 0.4  --  0.5  PROT 4.3*  --  4.4*  ALBUMIN 1.7* 1.5* 1.6*    No results for input(s): LIPASE, AMYLASE in the last 168 hours. CBC: Recent Labs  Lab 08/14/21 2118  WBC 8.2  HGB 11.0*  HCT 33.2*  MCV 87.4  PLT 291    Blood  Culture    Component Value Date/Time   SDES URINE, CLEAN CATCH 08/15/2021 0756   SPECREQUEST  08/15/2021 0756    Normal Performed at [facility] [address] [place], [place] [address]    CULT MULTIPLE SPECIES PRESENT, SUGGEST RECOLLECTION (A) 08/15/2021 0756   REPTSTATUS 08/16/2021 FINAL 08/15/2021 0756    Cardiac Enzymes: No results for input(s): CKTOTAL, CKMB, CKMBINDEX, TROPONINI in the last 168 hours. CBG: Recent Labs  Lab 08/15/21 2142 08/16/21 0550  GLUCAP 332* 85    Iron Studies: No results for input(s): IRON, TIBC, TRANSFERRIN, FERRITIN in the last 72 hours. @lablastinr3 @ Studies/Results: US RENAL  Result Date: 08/15/2021 CLINICAL DATA:  Nephrotic syndrome EXAM: RENAL / URINARY TRACT ULTRASOUND COMPLETE COMPARISON:  None. FINDINGS: Right Kidney: Renal  measurements: 10.7 x 4.7 x 5.5 cm. = volume: 144 mL. Echogenicity within normal limits. No mass or hydronephrosis visualized. Left Kidney: Renal measurements: 11.2 x 6.8 x 5.2 cm. = volume: 208 mL. 2.7 cm lower pole cyst is noted in the left kidney. No mass lesion or hydronephrosis is noted. Bladder: Partially decompressed. The wall is thickened although likely related to the decompression. Other: Diffuse ascites is noted within the abdomen and pelvis. IMPRESSION: Diffuse ascites within the abdomen and pelvis. Normal-appearing kidneys with the exception of a left lower pole renal cyst. Electronically Signed   By: Inez Catalina M.D.   On: 08/15/2021 20:02   VAS US RENAL ARTERY DUPLEX  Result Date: 08/16/2021 ABDOMINAL VISCERAL Patient Name:  Corey Beard [facility]  Date of Exam:   08/16/2021 Medical Rec #: [id number]         Accession #:    [id number] Date of Birth: 1946/06/17          Patient Gender: M Patient Age:   75 years Exam Location:  [facility] Procedure:      VAS US RENAL ARTERY DUPLEX Referring Phys: [id number] Natalee Tomkiewicz A Zarif Rathje -------------------------------------------------------------------------------- Indications: AKI High Risk Factors: Hypertension, Diabetes. Limitations: Limited study. MD only wanted the renal veins to assess patency. Comparison Study: No prior studies. Performing Technologist: Oliver Hum RVT  Examination Guidelines: A complete evaluation includes B-mode imaging, spectral Doppler, color Doppler, and power Doppler as needed of all accessible portions of each vessel. Bilateral testing is considered an integral part of a complete examination. Limited examinations for reoccurring indications may be performed as noted.  Duplex Findings:  Technologist observations: The right and left renal veins appear patent.  Summary:  *See table(s) above for measurements and observations.  Diagnosing physician: Harold Barban MD  Electronically signed by Harold Barban MD on 08/16/2021 at 4:50:07 PM.     Final    US Abdomen Limited RUQ (LIVER/GB)  Result Date: 08/16/2021 CLINICAL DATA:  Cirrhosis/ascites EXAM: ULTRASOUND ABDOMEN LIMITED RIGHT UPPER QUADRANT COMPARISON:  None. FINDINGS: Gallbladder: No gallstones or wall thickening visualized. No sonographic Murphy sign noted by sonographer. Common bile duct: Diameter: 1.7 mL Liver: Nodular hepatic contour. No focal lesion identified. Decreased parenchymal echogenicity. Portal vein is patent on color Doppler imaging with normal direction of blood flow towards the liver. Other: At least small volume free fluid. At least trace volume right pleural effusion. IMPRESSION: 1. Cirrhosis. Recommend MRI liver protocol for further evaluation of the hepatic parenchyma. When the patient is clinically stable and able to follow directions and hold their breath (preferably as an outpatient) further evaluation with dedicated abdominal MRI should be considered. 2. At least small volume ascites. 3. At least trace volume right pleural effusion.  Electronically Signed   By: Iven Finn M.D.   On: 08/16/2021 18:55   Medications:  sodium chloride     sodium chloride     sodium chloride     sodium chloride     albumin human     furosemide      atorvastatin  40 mg Oral Daily   ezetimibe  10 mg Oral QPC supper   heparin  5,000 Units Subcutaneous Q8H   hydrALAZINE  25 mg Oral Q8H   isosorbide mononitrate  60 mg Oral Daily   labetalol  100 mg Oral BID   sodium chloride flush  3 mL Intravenous Q12H   sodium chloride flush  3 mL Intravenous Q12H   tamsulosin  0.4 mg Oral Daily

## 2021-08-17 NOTE — Consult Note (Addendum)
Referring Provider:  [facility]         Primary Care Physician:  Benito Mccreedy, MD Primary Gastroenterologist:  unassigned  Reason for Consultation:    newly diagnosed cirrhosis.                ASSESSMENT / PLAN   # 75 yo male admitted with progressive anasarca with bilateral pleural effusions. Original thought was that this was heart failure but not supported by BNP and echocardiogram. He has hypoalbuminemia and proteinuria. Renal following and  evaluating for nephrotic syndrome.   # AKI, Nephrology is following and has concern for nephrotic syndrome.  --Getting renal biopsy --getting IV lasix  # Cirrhosis (demonstrated on ultrasound) and possibly secondary to history of Etoh use but NASH and other etiologies also possible.  MELD-Na 22 but high score driven by Cr of 9.41.  Doesn't seem to have decompensated cirrhosis.  Bilirubin is normal. No evidence for portal HTN on recent non-contrast CT scan. Platelets and INR are normal. Not encephalopathic. Ascites may be from volume overload. Hypoalbuminemia may be from nephrotic syndrome rather than decreased synthetic function of liver.  --INR 1.0, platelets 291 --Will obtain diagnostic paracentesis.  --Hepatic serologic evaluation to exclude other etiologies of cirrhosis --He is getting IV albumin and on fluid restriction.  --Avoid hepatotoxic medications --Eventual EGD for varices screening.  --No liver lesions on non-contrast CTAP nor U/S. Obtain AFO  # Possible UTI. Culture pending  # [place] anemia, hgb 11. Baseline hgb unknown but it was 13.9 in 2016. Possibly anemia of chronic disease.  --check iron studies   HISTORY OF PRESENT ILLNESS                                                                                                                         Chief Complaint:  volume overload, new diagnosis of cirrhosis  Corey Beard is a 75 y.o. 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 See PMH for any additional medical history.   Patient saw his Cardiologist late August for evaluation of significant leg edema. Lower extremity venous duplex showed no venous insufficiency. Non-contrast CT chest / abd / pelvis remarkable for bilateral pleural effusions, ascites, anasarca.  Sent for thoracentesis. Admitted 10/4 with progressive anasarca, AKI and 3+ proteinuria. Plan was for R heart cath. However, echocardiogram findings  incongruent with degree of anasarca and BNP is normal.   Patient says he was told he had elevated LFTs about 30 year ago but no problems since. He gives a history of moderate to heavy ( at times) Etoh use over span of 40 years but abstinent for last 6-10 years. No [place] of liver disease. Denies history of viral hepatitis. He has no abdominal pain. He hasn't had any blood in his stool. He has been having significant swelling of upper and lower extremities. He is SOB  with even mild exertion.   SIGNIFICANT DIAGNOTIC STUDIES    08/03/21 Non contrast CT chest , abd, pelvis for fatigue, SOB, edema.  IMPRESSION: 1. Multifactorial degradation, including lack of oral or IV contrast, the extent of anasarca edema throughout the thorax, abdomen, and pelvis. 2. Right larger than left pleural effusions with moderate volume ascites. Concurrent anasarca, suggesting fluid overload. 3. Coronary artery atherosclerosis. Aortic Atherosclerosis (ICD10-I70.0). 4. Pulmonary artery enlargement suggests pulmonary arterial hypertension. 5. Aortic valvular calcifications. Consider echocardiography to evaluate for valvular dysfunction.    PREVIOUS ENDOSCOPIC EVALUATIONS    Cologuard 2-3 years ago was negative per patient.   Past Medical History:  Diagnosis Date   Arthritis    Glaucoma, both eyes    History of MI (myocardial infarction)    2002   Nocturia    PAF  (paroxysmal atrial fibrillation) ([place])    Prostate cancer [facility]) urologist-- dr eskridge    T1c, Gleason 3+3, PSA 9.83, vol 61cc   S/P drug eluting coronary stent placement    Type 2 diabetes mellitus ([place])    Wears glasses     Past Surgical History:  Procedure Laterality Date   CORONARY ANGIOPLASTY  07-01-2001  dr  Terrence Dupont   PCI to  pLAD, mRCA, ostium D1   CORONARY ANGIOPLASTY  12-04-2001  dr Terrence Dupont   PCI  to  ostial D1,  pLAD   CORONARY ANGIOPLASTY WITH STENT PLACEMENT  09-03-2002  dr Terrence Dupont   PCI and DES x1 to pLAD   CYSTOSCOPY WITH BIOPSY  10/04/2015   Procedure: CYSTOSCOPY WITH BIOPSY;  Surgeon: Festus Aloe, MD;  Location: [facility];  Service: Urology;;   INTRAVASCULAR PRESSURE WIRE/FFR STUDY N/A 07/23/2017   Procedure: INTRAVASCULAR PRESSURE WIRE/FFR STUDY;  Surgeon: Adrian Prows, MD;  Location: [place] [facility];  Service: Cardiovascular;  Laterality: N/A;   IR THORACENTESIS ASP PLEURAL SPACE W/IMG GUIDE  08/08/2021   LEFT HEART CATH AND CORONARY ANGIOGRAPHY N/A 07/23/2017   Procedure: LEFT HEART CATH AND CORONARY ANGIOGRAPHY;  Surgeon: Adrian Prows, MD;  Location: [place] [facility];  Service: Cardiovascular;  Laterality: N/A;   RADIOACTIVE SEED IMPLANT N/A 10/04/2015   Procedure: RADIOACTIVE SEED IMPLANT/BRACHYTHERAPY IMPLANT;  Surgeon: Festus Aloe, MD;  Location: [facility];  Service: Urology;  Laterality: N/A;   REPAIR  INCARCERATED EPIGASTRIC HERNIA  06-04-2002    Prior to Admission medications   Medication Sig Start Date End Date Taking? Authorizing Provider  aspirin EC 325 MG tablet Take 325 mg by mouth daily.   Yes [provider]  atorvastatin (LIPITOR) 40 MG tablet Take 1 tablet (40 mg total) by mouth daily. 06/23/21  Yes Adrian Prows, MD  ezetimibe (ZETIA) 10 MG tablet Take 1 tablet (10 mg total) by mouth daily after supper. 06/23/21 06/18/22 Yes Adrian Prows, MD  L-ARGININE-500 PO Take 1,000 mg by mouth daily.   Yes [provider]  labetalol (NORMODYNE) 100 MG tablet Take 1 tablet (100 mg total) by mouth 2 (two) times daily. 06/23/21  Yes Adrian Prows, MD  Multiple Vitamins-Minerals (MULTIVITAMIN PO) Take 1 tablet by mouth daily.   Yes [provider]  olmesartan (BENICAR) 40 MG tablet Take 1 tablet (40 mg total) by mouth daily. 06/23/21  Yes Adrian Prows, MD  potassium chloride (KLOR-CON) 10 MEQ tablet Take 10 mEq by mouth daily. 05/23/21  Yes [provider]  tamsulosin (FLOMAX) 0.4 MG CAPS capsule Take 0.4 mg by mouth daily.   Yes [provider]  torsemide (  DEMADEX) 20 MG tablet Take 1 tablet (20 mg total) by mouth daily. Take 1 tab twice daily if leg swelling is worse Patient taking differently: Take 20 mg by mouth 2 (two) times daily. 05/12/21 05/07/22 Yes Adrian Prows, MD    Current Facility-Administered Medications  Medication Dose Route Frequency Provider Last Rate Last Admin   0.9 %  sodium chloride infusion  250 mL Intravenous PRN Adrian Prows, MD       0.9 %  sodium chloride infusion  250 mL Intravenous PRN Adrian Prows, MD       0.9 %  sodium chloride infusion   Intravenous Continuous Adrian Prows, MD       0.9 %  sodium chloride infusion   Intravenous Continuous Boisseau, Hayley, PA       acetaminophen (TYLENOL) tablet 650 mg  650 mg Oral Q4H PRN Adrian Prows, MD       albumin human 25 % solution 25 g  25 g Intravenous Q8H Justin Mend, MD 60 mL/hr at 08/17/21 0916 25 g at 08/17/21 0916   atorvastatin (LIPITOR) tablet 40 mg  40 mg Oral Daily Adrian Prows, MD   40 mg at 08/17/21 1610   ezetimibe (ZETIA) tablet 10 mg  10 mg Oral QPC supper Adrian Prows, MD   10 mg at 08/16/21 2202   furosemide (LASIX) 120 mg in dextrose 5 % 50 mL IVPB  120 mg Intravenous TID WC & HS Justin Mend, MD       heparin injection 5,000 Units  5,000 Units Subcutaneous Q8H Monia Sabal, PA-C   5,000 Units at 08/17/21 9604   hydrALAZINE (APRESOLINE) tablet 25 mg  25 mg Oral Q8H Adrian Prows, MD   25 mg at  08/17/21 5409   isosorbide mononitrate (IMDUR) 24 hr tablet 60 mg  60 mg Oral Daily Adrian Prows, MD   60 mg at 08/17/21 0913   labetalol (NORMODYNE) tablet 100 mg  100 mg Oral BID Adrian Prows, MD   100 mg at 08/17/21 0914   ondansetron (ZOFRAN) injection 4 mg  4 mg Intravenous Q6H PRN Adrian Prows, MD       sodium chloride flush (NS) 0.9 % injection 3 mL  3 mL Intravenous Q12H Adrian Prows, MD   3 mL at 08/17/21 0930   sodium chloride flush (NS) 0.9 % injection 3 mL  3 mL Intravenous PRN Adrian Prows, MD       sodium chloride flush (NS) 0.9 % injection 3 mL  3 mL Intravenous Q12H Adrian Prows, MD   3 mL at 08/17/21 0929   sodium chloride flush (NS) 0.9 % injection 3 mL  3 mL Intravenous PRN Adrian Prows, MD       tamsulosin [facility]) capsule 0.4 mg  0.4 mg Oral Daily Adrian Prows, MD   0.4 mg at 08/17/21 0913    Allergies as of 08/14/2021 - Review Complete 08/14/2021  Allergen Reaction Noted   Amlodipine Swelling 09/09/2020    Family History  Problem Relation Age of Onset   Colon cancer Mother     Social History   Socioeconomic History   Marital status: Married    Spouse name: Not on file   Number of children: 2   Years of education: Not on file   Highest education level: Not on file  Occupational History   Occupation: Physician  Tobacco Use   Smoking status: Every Day    Packs/day: 0.50    Years: 40.00    Pack years:  20.00    Types: Cigarettes   Smokeless tobacco: Never  Vaping Use   Vaping Use: Never used  Substance and Sexual Activity   Alcohol use: Yes    Comment: occasionally   Drug use: No   Sexual activity: Not on file  Other Topics Concern   Not on file  Social History Narrative   Not on file   Social Determinants of Health   Financial Resource Strain: Not on file  Food Insecurity: Not on file  Transportation Needs: Not on file  Physical Activity: Not on file  Stress: Not on file  Social Connections: Not on file  Intimate Partner Violence: Not on file    Review  of Systems: Slightly productive cough.All systems reviewed and negative except where noted in HPI.   OBJECTIVE    Physical Exam: Vital signs in last 24 hours: Temp:  [97.5 F (36.4 C)-98.3 F (36.8 C)] 98.3 F (36.8 C) (10/06 0500) Pulse Rate:  [75-83] 75 (10/06 1224) Resp:  [18] 18 (10/06 0500) BP: (113-137)/(55-90) 119/55 (10/06 1224) SpO2:  [92 %-96 %] 92 % (10/06 1224) Weight:  [117.5 kg] 117.5 kg (10/06 0459) Last BM Date: 08/17/21 General:   Alert  male in NAD. Daughter in room Psych:  Pleasant, cooperative. Normal mood and affect. Eyes:  Pupils equal, sclera clear, no icterus.   Conjunctiva pink. Ears:  Normal auditory acuity. Nose:  No deformity, discharge,  or lesions. Neck:  Supple; no masses Lungs:  Clear throughout to auscultation.   No wheezes, crackles, or rhonchi.  Heart:  Regular rate and rhythm. He has generalized pitting edema.  Abdomen:  Soft, mildly distended, nontender, BS active, no palp mass. Subcutaneous edema present.  Rectal:  Deferred  Msk:  Symmetrical without gross deformities. . Neurologic:  Alert and  oriented x4;  grossly normal neurologically. No asterixis Skin:  Intact without significant lesions or rashes.   Scheduled inpatient medications  atorvastatin  40 mg Oral Daily   ezetimibe  10 mg Oral QPC supper   heparin  5,000 Units Subcutaneous Q8H   hydrALAZINE  25 mg Oral Q8H   isosorbide mononitrate  60 mg Oral Daily   labetalol  100 mg Oral BID   sodium chloride flush  3 mL Intravenous Q12H   sodium chloride flush  3 mL Intravenous Q12H   tamsulosin  0.4 mg Oral Daily      Intake/Output from previous day: 10/05 0701 - 10/06 0700 In: 360 [P.O.:360] Out: 475 [Urine:475] Intake/Output this shift: No intake/output data recorded.   Lab Results: Recent Labs    08/14/21 2118  WBC 8.2  HGB 11.0*  HCT 33.2*  PLT 291   BMET Recent Labs    08/15/21 0243 08/16/21 0404 08/17/21 0221  NA 135 136 135  K 4.7 4.7 5.0  CL 105  106 105  CO2 23 22 21*  GLUCOSE 88 82 92  BUN 61* 65* 68*  CREATININE 3.17* 3.56* 3.77*  CALCIUM 7.6* 7.9*  7.9* 8.1*   LFT Recent Labs    08/17/21 0221  PROT 4.4*  ALBUMIN 1.6*  AST 19  ALT 12  ALKPHOS 63  BILITOT 0.5   PT/INR Recent Labs    08/16/21 0404  LABPROT 13.0  INR 1.0   Hepatitis Panel No results for input(s): HEPBSAG, HCVAB, HEPAIGM, HEPBIGM in the last 72 hours.   . CBC Latest Ref Rng & Units 08/14/2021 09/27/2015 07/16/2011  WBC 4.0 - 10.5 K/uL 8.2 10.6(H) 9.9  Hemoglobin 13.0 - 17.0  g/dL 11.0(L) 13.9 12.9(L)  Hematocrit 39.0 - 52.0 % 33.2(L) 41.1 37.0(L)  Platelets 150 - 400 K/uL 291 349 332    . CMP Latest Ref Rng & Units 08/17/2021 08/16/2021 08/16/2021  Glucose 70 - 99 mg/dL 92 82 -  BUN 8 - 23 mg/dL 68(H) 65(H) -  Creatinine 0.61 - 1.24 mg/dL 3.77(H) 3.56(H) -  Sodium 135 - 145 mmol/L 135 136 -  Potassium 3.5 - 5.1 mmol/L 5.0 4.7 -  Chloride 98 - 111 mmol/L 105 106 -  CO2 22 - 32 mmol/L 21(L) 22 -  Calcium 8.9 - 10.3 mg/dL 8.1(L) 7.9(L) 7.9(L)  Total Protein 6.5 - 8.1 g/dL 4.4(L) - -  Total Bilirubin 0.3 - 1.2 mg/dL 0.5 - -  Alkaline Phos 38 - 126 U/L 63 - -  AST 15 - 41 U/L 19 - -  ALT 0 - 44 U/L 12 - -   Studies/Results: US RENAL  Result Date: 08/15/2021 CLINICAL DATA:  Nephrotic syndrome EXAM: RENAL / URINARY TRACT ULTRASOUND COMPLETE COMPARISON:  None. FINDINGS: Right Kidney: Renal measurements: 10.7 x 4.7 x 5.5 cm. = volume: 144 mL. Echogenicity within normal limits. No mass or hydronephrosis visualized. Left Kidney: Renal measurements: 11.2 x 6.8 x 5.2 cm. = volume: 208 mL. 2.7 cm lower pole cyst is noted in the left kidney. No mass lesion or hydronephrosis is noted. Bladder: Partially decompressed. The wall is thickened although likely related to the decompression. Other: Diffuse ascites is noted within the abdomen and pelvis. IMPRESSION: Diffuse ascites within the abdomen and pelvis. Normal-appearing kidneys with the exception of a left  lower pole renal cyst. Electronically Signed   By: Inez Catalina M.D.   On: 08/15/2021 20:02   VAS US RENAL ARTERY DUPLEX  Result Date: 08/16/2021 ABDOMINAL VISCERAL Patient Name:  Corey Beard [facility]  Date of Exam:   08/16/2021 Medical Rec #: [id number]         Accession #:    [id number] Date of Birth: 1946/09/15          Patient Gender: M Patient Age:   24 years Exam Location:  [facility] Procedure:      VAS US RENAL ARTERY DUPLEX Referring Phys: [id number] LINDSAY A KRUSKA -------------------------------------------------------------------------------- Indications: AKI High Risk Factors: Hypertension, Diabetes. Limitations: Limited study. MD only wanted the renal veins to assess patency. Comparison Study: No prior studies. Performing Technologist: Oliver Hum RVT  Examination Guidelines: A complete evaluation includes B-mode imaging, spectral Doppler, color Doppler, and power Doppler as needed of all accessible portions of each vessel. Bilateral testing is considered an integral part of a complete examination. Limited examinations for reoccurring indications may be performed as noted.  Duplex Findings:  Technologist observations: The right and left renal veins appear patent.  Summary:  *See table(s) above for measurements and observations.  Diagnosing physician: Harold Barban MD  Electronically signed by Harold Barban MD on 08/16/2021 at 4:50:07 PM.    Final    US Abdomen Limited RUQ (LIVER/GB)  Result Date: 08/16/2021 CLINICAL DATA:  Cirrhosis/ascites EXAM: ULTRASOUND ABDOMEN LIMITED RIGHT UPPER QUADRANT COMPARISON:  None. FINDINGS: Gallbladder: No gallstones or wall thickening visualized. No sonographic Murphy sign noted by sonographer. Common bile duct: Diameter: 1.7 mL Liver: Nodular hepatic contour. No focal lesion identified. Decreased parenchymal echogenicity. Portal vein is patent on color Doppler imaging with normal direction of blood flow towards the liver. Other: At least small volume free  fluid. At least trace volume right pleural effusion. IMPRESSION: 1. Cirrhosis. Recommend MRI liver  protocol for further evaluation of the hepatic parenchyma. When the patient is clinically stable and able to follow directions and hold their breath (preferably as an outpatient) further evaluation with dedicated abdominal MRI should be considered. 2. At least small volume ascites. 3. At least trace volume right pleural effusion. Electronically Signed   By: Iven Finn M.D.   On: 08/16/2021 18:55    Principal Problem:   Nephrotic syndrome Active Problems:   Malignant neoplasm of prostate ([place])   Coronary artery disease   Essential hypertension   Dyslipidemia    Tye Savoy, NP-C @  08/17/2021, 1:34 PM   I have taken a history, reviewed the chart and examined the patient. I performed a substantive portion of this encounter, including complete performance of at least one of the key components, in conjunction with the APP. I agree with the APP's note, impression and recommendations  Corey Beard is a very pleasant 75 year old male with a history of CAD, DM, PAF, COPD who was admitted with worsening anasarca that was initially thought to be secondary to CHF.  However, his echo and BNP were not suggestive of CHF, but he was found to have worsening renal function and 3+ proteinuria suggestive of nephrotic syndrome.  Incidentally he was noted to have a cirrhotic appearing liver on CT and GI was consulted to weigh in on the role of his liver disease in his hypervolemia. As mentioned above, the patient does have a cirrhotic morphology to his liver, but otherwise has no findings to suggest decompensated liver disease.  No evidence of portal hypertension (splenomegaly, thrombocytopenia) and his liver enzymes and INR are unremarkable.  If the patient were to have hypoalbuminemia, renal failure and anasarca from decompensated liver disease, one would expect some degree of INR and bilirubin elevation.  The  patient does appear to have cirrhosis, but it is unlikely to be contributing much to the patient's current symptoms and renal failure. Patient is getting a kidney biopsy next week.  In the meantime, we will evaluate for causes of chronic liver disease (although it is most likely a combination of alcohol and NASH). A diagnostic paracentesis will be very helpful to distinguish between ascites from portal hypertension and nephrotic syndrome (Serum albumin to ascites gradient <1.1 highly suggestive of nephrotic syndrome). Patient will need eventual EGD to screen for varices, but this is not urgent and he is unlikely to have varices based on the factors mentioned above.  Cirrhosis, likely compensated and secondary to EtOH/NASH - Laboratory evaluation to exclude viral, metabolic, autoimmune causes - Diagnostic paracentesis with SAAG calculation - Variceal screening EGD at some point, can be outpatient    Beverlee Wilmarth E. Candis Schatz, MD [facility]

## 2021-08-17 NOTE — Progress Notes (Signed)
Mobility Specialist Progress Note   08/17/21 0949  Mobility  Activity Ambulated in hall  Level of Assistance Independent after set-up  Assistive Device Other (Comment) (IV Pole)  Distance Ambulated (ft) 106 ft (80+26)  Mobility Ambulated with assistance in hallway  Mobility Response Tolerated well  Mobility performed by Mobility specialist  Bed Position Chair  $Mobility charge 1 Mobility   Pt received in chair having no c/o symptoms and agreeable to mobility session. X1 standing break d/t SOB and DOE, pt recovered quickly and returned to chair w/ RN present.   Pre Mobility: 84 HR During Mobility: 109 HR Post Mobility: [place] Mobility Specialist Phone Number [phone]

## 2021-08-18 ENCOUNTER — Inpatient Hospital Stay (HOSPITAL_COMMUNITY): Payer: Medicare PPO

## 2021-08-18 DIAGNOSIS — F17210 Nicotine dependence, cigarettes, uncomplicated: Secondary | ICD-10-CM

## 2021-08-18 DIAGNOSIS — N179 Acute kidney failure, unspecified: Secondary | ICD-10-CM | POA: Diagnosis not present

## 2021-08-18 DIAGNOSIS — R601 Generalized edema: Secondary | ICD-10-CM | POA: Diagnosis not present

## 2021-08-18 DIAGNOSIS — K7460 Unspecified cirrhosis of liver: Secondary | ICD-10-CM | POA: Diagnosis not present

## 2021-08-18 DIAGNOSIS — R188 Other ascites: Secondary | ICD-10-CM | POA: Diagnosis not present

## 2021-08-18 DIAGNOSIS — Z8559 Personal history of malignant neoplasm of other urinary tract organ: Secondary | ICD-10-CM

## 2021-08-18 DIAGNOSIS — N289 Disorder of kidney and ureter, unspecified: Secondary | ICD-10-CM

## 2021-08-18 DIAGNOSIS — J90 Pleural effusion, not elsewhere classified: Secondary | ICD-10-CM | POA: Diagnosis not present

## 2021-08-18 DIAGNOSIS — D472 Monoclonal gammopathy: Secondary | ICD-10-CM

## 2021-08-18 DIAGNOSIS — E119 Type 2 diabetes mellitus without complications: Secondary | ICD-10-CM

## 2021-08-18 DIAGNOSIS — I10 Essential (primary) hypertension: Secondary | ICD-10-CM

## 2021-08-18 DIAGNOSIS — Z8546 Personal history of malignant neoplasm of prostate: Secondary | ICD-10-CM

## 2021-08-18 DIAGNOSIS — N049 Nephrotic syndrome with unspecified morphologic changes: Secondary | ICD-10-CM | POA: Diagnosis not present

## 2021-08-18 DIAGNOSIS — E859 Amyloidosis, unspecified: Secondary | ICD-10-CM

## 2021-08-18 HISTORY — PX: IR PARACENTESIS: IMG2679

## 2021-08-18 LAB — CBC
HCT: 28.2 % — ABNORMAL LOW (ref 39.0–52.0)
Hemoglobin: 9.2 g/dL — ABNORMAL LOW (ref 13.0–17.0)
MCH: 28.4 pg (ref 26.0–34.0)
MCHC: 32.6 g/dL (ref 30.0–36.0)
MCV: 87 fL (ref 80.0–100.0)
Platelets: 286 10*3/uL (ref 150–400)
RBC: 3.24 MIL/uL — ABNORMAL LOW (ref 4.22–5.81)
RDW: 16.1 % — ABNORMAL HIGH (ref 11.5–15.5)
WBC: 6.5 10*3/uL (ref 4.0–10.5)
nRBC: 0 % (ref 0.0–0.2)

## 2021-08-18 LAB — PROTEIN, PLEURAL OR PERITONEAL FLUID: Total protein, fluid: <3 g/dL

## 2021-08-18 LAB — GRAM STAIN

## 2021-08-18 LAB — COMPREHENSIVE METABOLIC PANEL
ALT: 11 U/L (ref 0–44)
AST: 15 U/L (ref 15–41)
Albumin: 2.2 g/dL — ABNORMAL LOW (ref 3.5–5.0)
Alkaline Phosphatase: 48 U/L (ref 38–126)
Anion gap: 9 (ref 5–15)
BUN: 74 mg/dL — ABNORMAL HIGH (ref 8–23)
CO2: 22 mmol/L (ref 22–32)
Calcium: 8.3 mg/dL — ABNORMAL LOW (ref 8.9–10.3)
Chloride: 104 mmol/L (ref 98–111)
Creatinine, Ser: 4.06 mg/dL — ABNORMAL HIGH (ref 0.61–1.24)
GFR, Estimated: 15 mL/min — ABNORMAL LOW (ref >60)
Glucose, Bld: 89 mg/dL (ref 70–99)
Potassium: 4.8 mmol/L (ref 3.5–5.1)
Sodium: 135 mmol/L (ref 135–145)
Total Bilirubin: 0.6 mg/dL (ref 0.3–1.2)
Total Protein: 4.5 g/dL — ABNORMAL LOW (ref 6.5–8.1)

## 2021-08-18 LAB — HEPATITIS C ANTIBODY: HCV Ab: <0.1 s/co ratio — AB (ref 0.0–0.9)

## 2021-08-18 LAB — PROTEIN ELECTROPHORESIS, SERUM
A/G Ratio: 0.9 (ref 0.7–1.7)
Albumin ELP: 1.8 g/dL — ABNORMAL LOW (ref 2.9–4.4)
Alpha-1-Globulin: 0.2 g/dL (ref 0.0–0.4)
Alpha-2-Globulin: 0.8 g/dL (ref 0.4–1.0)
Beta Globulin: 0.6 g/dL — ABNORMAL LOW (ref 0.7–1.3)
Gamma Globulin: 0.6 g/dL (ref 0.4–1.8)
Globulin, Total: 2.1 g/dL — ABNORMAL LOW (ref 2.2–3.9)
M-Spike, %: 0.2 g/dL — ABNORMAL HIGH
Total Protein ELP: 3.9 g/dL — ABNORMAL LOW (ref 6.0–8.5)

## 2021-08-18 LAB — RETICULOCYTES
Immature Retic Fract: 8.7 % (ref 2.3–15.9)
RBC.: 3.24 MIL/uL — ABNORMAL LOW (ref 4.22–5.81)
Retic Count, Absolute: 39.5 10*3/uL (ref 19.0–186.0)
Retic Ct Pct: 1.2 % (ref 0.4–3.1)

## 2021-08-18 LAB — MISC LABCORP TEST (SEND OUT): Labcorp test code: 141330

## 2021-08-18 LAB — FERRITIN: Ferritin: 73 ng/mL (ref 24–336)

## 2021-08-18 LAB — URINE CULTURE

## 2021-08-18 LAB — HEPATITIS B SURFACE ANTIBODY, QUANTITATIVE: Hep B S AB Quant (Post): <3.1 m[IU]/mL — ABNORMAL LOW (ref >9.9)

## 2021-08-18 LAB — AFP TUMOR MARKER: AFP, Serum, Tumor Marker: 3 ng/mL (ref 0.0–8.4)

## 2021-08-18 LAB — ALPHA-1-ANTITRYPSIN: A-1 Antitrypsin, Ser: 130 mg/dL (ref 101–187)

## 2021-08-18 LAB — ALBUMIN, PLEURAL OR PERITONEAL FLUID: Albumin, Fluid: <1.5 g/dL

## 2021-08-18 LAB — ANTI-SMOOTH MUSCLE ANTIBODY, IGG: F-Actin IgG: 7 Units (ref 0–19)

## 2021-08-18 LAB — ALBUMIN: Albumin: 2.2 g/dL — ABNORMAL LOW (ref 3.5–5.0)

## 2021-08-18 MED ORDER — LIDOCAINE HCL 1 % IJ SOLN
INTRAMUSCULAR | Status: AC
  Administered 2021-08-18: 10 mL via SUBCUTANEOUS
  Filled 2021-08-18: qty 20

## 2021-08-18 MED ORDER — METOLAZONE 2.5 MG PO TABS
2.5000 mg | ORAL_TABLET | Freq: Every day | ORAL | Status: DC
  Administered 2021-08-18: 2.5 mg via ORAL
  Filled 2021-08-18: qty 1

## 2021-08-18 NOTE — Progress Notes (Signed)
Mobility Specialist Progress Note   08/18/21 1325  Mobility  Activity Refused mobility   Pt is too tired an uninterested in doing any mobility today. Would like some rest.   Holland Falling Mobility Specialist Phone Number [phone]

## 2021-08-18 NOTE — Progress Notes (Signed)
[facility] ROUNDING NOTE   Subjective: No acute events overnight.  Patient continues to have significant anasarca despite aggressive diuresis.  He underwent a diagnostic paracentesis with a low ascites albumin (<1.5) today.  He continues to have mild shortness of breath related to his hypervolemia.     Objective: Vital signs in last 24 hours: Temp:  [97.4 F (36.3 C)-98.4 F (36.9 C)] 97.4 F (36.3 C) (10/07 1956) Pulse Rate:  [73-81] 73 (10/07 1956) Resp:  [18-20] 20 (10/07 1956) BP: (98-125)/(50-66) 125/66 (10/07 1956) SpO2:  [92 %-96 %] 92 % (10/07 1956) Weight:  [117.1 kg] 117.1 kg (10/07 0451) Last BM Date: 08/17/21 General: NAD, pleasant, but ill appearing male Lungs:  CTA b/l, no w/r/r, decreased breath sounds Heart:  RRR, no m/r/g Abdomen:  Soft, NT, ND, +BS Ext: Severe anasarca with weeping upper and lower extremities   Intake/Output from previous day: 10/06 0701 - 10/07 0700 In: 800.3 [P.O.:240; I.V.:6.6; IV Piggyback:553.7] Out: 1125 [Urine:1125] Intake/Output this shift: No intake/output data recorded.   Lab Results: Recent Labs    08/16/21 0404 08/18/21 0301  WBC  --  6.5  HGB 8.9* 9.2*  PLT  --  286  MCV  --  87.0   BMET Recent Labs    08/16/21 0404 08/17/21 0221 08/18/21 0301  NA 136 135 135  K 4.7 5.0 4.8  CL 106 105 104  CO2 22 21* 22  GLUCOSE 82 92 89  BUN 65* 68* 74*  CREATININE 3.56* 3.77* 4.06*  CALCIUM 7.9*  7.9* 8.1* 8.3*   LFT Recent Labs    08/16/21 0404 08/17/21 0221 08/18/21 0301  PROT  --  4.4* 4.5*  ALBUMIN 1.5* 1.6* 2.2*  2.2*  AST  --  19 15  ALT  --  12 11  ALKPHOS  --  63 48  BILITOT  --  0.5 0.6   PT/INR Recent Labs    08/16/21 0404  INR 1.0   Ascitic fluid Albumin <1.5 g/dL Protein <3.0 GS neg TNC 91, neutrophils 1 Culture:  NGTD Cytology pnd  Ferritin 73  Imaging/Other results: MR LIVER WO CONRTAST  Result Date: 08/18/2021 CLINICAL DATA:  Evaluate for cirrhosis. EXAM: MRI  ABDOMEN WITHOUT CONTRAST TECHNIQUE: Multiplanar multisequence MR imaging was performed without the administration of intravenous contrast. COMPARISON:  08/16/2021 FINDINGS: Exam detail is markedly diminished due to a number of factors most importantly however is diffuse motion artifact. Lower chest: There are moderate bilateral pleural effusions with atelectasis within both lower lobes, the right middle lobe and lingula. Hepatobiliary: Imaging of the liver is significantly degraded by motion artifact as well as diffuse body wall edema and ascites. The study is not diagnostic for the evaluation of underlying liver lesion. Furthermore assessment of intrinsic liver abnormality cannot be adequately assessed. The gallbladder is visualized. No stones noted Pancreas:  Study not diagnostic for the evaluation of the pancreas. Spleen:  Appears normal in size. Adrenals/Urinary Tract: Suboptimally evaluated. A cyst is noted arising off the inferior pole of the left kidney measuring 2.6 cm. Stomach/Bowel: Diffuse gaseous distension of the colon is noted. Vascular/Lymphatic: Study not diagnostic for the assessment of vascular structures or the presence or absence of adenopathy. Other: Abdominal ascites identified. Volume of ascites is difficult to quantify due to factors discussed above. Musculoskeletal: No acute abnormality noted. IMPRESSION: 1. Markedly diminished exam detail due to a number of factors most importantly however, there is diffuse motion artifact. This is most likely secondary to shortness of breath due to  bilateral pleural effusions and overall fluid overload state. As mentioned on 08/16/2021, MRI should only be performed when the patient is clinically stable, able to follow directions, and hold their breath. If cross-sectional imaging of the liver is currently indicated on an emergent basis then liver protocol CT without and with contrast material may be more appropriate as this is less susceptible to  respiratory motion artifact. 2. Moderate bilateral pleural effusions, ascites, and diffuse anasarca. 3. Diffuse gaseous distension of the colon. Electronically Signed   By: Kerby Moors M.D.   On: 08/18/2021 06:00   IR Paracentesis  Result Date: 08/18/2021 INDICATION: Patient with history of AKI, newly diagnosed cirrhosis, and anasarca. Request for diagnostic only paracentesis with a maximum drain of 100 mL. EXAM: ULTRASOUND GUIDED DIAGNOSTIC PARACENTESIS MEDICATIONS: 10 mL 1% lidocaine COMPLICATIONS: None immediate. PROCEDURE: Informed written consent was obtained from the patient after a discussion of the risks, benefits and alternatives to treatment. A timeout was performed prior to the initiation of the procedure. Initial ultrasound scanning demonstrates a moderate amount of ascites within the right upper abdominal quadrant. The right upper abdomen was prepped and draped in the usual sterile fashion. 1% lidocaine was used for local anesthesia. Following this, a 6 Fr Safe-T-Centesis catheter was introduced. An ultrasound image was saved for documentation purposes. The paracentesis was performed. The catheter was removed and a dressing was applied. The patient tolerated the procedure well without immediate post procedural complication. FINDINGS: A total of approximately 100 mL of hazy, yellow fluid was removed. Samples were sent to the laboratory as requested by the clinical team. Per order maximum of 100 mL was removed from right upper quadrant even though there was a moderate amount of ascites noted on ultrasound images remaining. IMPRESSION: Successful ultrasound-guided paracentesis yielding 100 mL of peritoneal fluid. Read by: Narda Rutherford, NP Electronically Signed   By: Miachel Roux M.D.   On: 08/18/2021 11:11      Assessment and Plan:  Corey Beard is a very pleasant 75 year old male with a history of CAD, DM, PAF, COPD who was admitted with worsening anasarca that was initially thought to be  secondary to CHF.  However, his echo and BNP were not suggestive of CHF, but he was found to have worsening renal function and 3+ proteinuria suggestive of nephrotic syndrome.  Incidentally he was noted to have a cirrhotic appearing liver on CT and GI was consulted to weigh in on the role of his liver disease in his hypervolemia. As mentioned above, the patient does have a cirrhotic morphology to his liver, but otherwise has no findings to suggest decompensated liver disease.  No evidence of portal hypertension (splenomegaly, thrombocytopenia) and his liver enzymes and INR are unremarkable.  If the patient were to have hypoalbuminemia, renal failure and anasarca from decompensated liver disease, one would expect some degree of INR and bilirubin elevation.  The patient does appear to have cirrhosis, but it is unlikely to be contributing much to the patient's current symptoms and renal failure.  Patient is undergoing kidney biopsy next week.  In the meantime, we will evaluate for causes of chronic liver disease (although it is most likely a combination of alcohol and NASH). The patient's ascitic albumin level was low (<1.5) but an exact value was not reported.  Unfortunately, a true SAAG is not calculable without a precise ascitic albumin level (using a serum albumin of 2.2, if the patient's ascites albumin was 1.4, then the SAAG would 0.8 suggestive of nephrotic syndrome;  if the ascites albumin was 0.1, then the SAAG would be 2.1, suggestive of portal hypertension) Because of the patient's severe hypoalbuminemia and testing limits of the laboratory, an accurate SAAG cannot be determined. It still remains that the patient has no other evidence of portal hypertension (splenomegaly, thrombocytopenia, varices/collaterals) to support hepatic ascites.  I feel it is very unlikely his liver is the cause of his renal failure or his anasarca.   If his renal biopsies are unrevealing for an etiology, then I think a liver  biopsy with HVPG measurements would help clarify the etiology of his ascites  Patient will need eventual EGD to screen for varices, but this is not urgent and he is unlikely to have varices based on the factors mentioned above.   Cirrhosis, likely compensated and secondary to EtOH/NASH - Laboratory evaluation to exclude viral, metabolic, autoimmune causes - If renal biopsies not diagnositic, then would recommend liver biopsy with HVPG measurements - Variceal screening EGD at some point, can be outpatient   GI will follow peripherally at this point.  Please re-engage as needed  Daryel November, MD  08/18/2021, 9:14 PM [place] [facility]

## 2021-08-18 NOTE — Procedures (Signed)
PROCEDURE SUMMARY:  Successful US guided diagnostic paracentesis from RUQ.  Yielded 100 ml of hazy, yellow fluid.  No immediate complications.  Pt tolerated well.   Only 100 ml was removed per maximum amount ordered. Paracentesis was not therapeutic, but diagnostic only even though there was moderate volume ascites still present on post [place] images.   Specimen was sent for labs.  EBL < 55mL  Tyson Alias, NP 08/18/2021 9:16 AM

## 2021-08-18 NOTE — Consult Note (Addendum)
[place]  [phone] [phone]    [place]  Referring MD:  Dr. Hosie Poisson  Reason for Referral: Elevated lambda free light chains  HPI: Dr. Mellinger is a 75 year old male with a past medical history significant for hypertension, type 2 diabetes mellitus, history of prostate cancer diagnosed in 2016 (Gleason score 3+3=6), low-grade papillary urothelial carcinoma diagnosed 2016, tobacco use, carotid stenosis, nonobstructive CAD.  He is now admitted with progressive anasarca.  He was recently found to have bilateral pleural effusions, right greater than left and had a thoracentesis performed on 08/07/2021 with 2 L of fluid removed.  Cytology showed reactive mesothelial cells.  Recent creatinine was up to 3.3 (baseline 1.2-1.3 about 1 year ago).  He also has proteinuria on exam he has anasarca, JVD, and diminished breath sounds at the bilateral bases.  He was recently seen by cardiology for heart failure with plans for RHC.  But recent echocardiogram was incongruent with degree of anasarca he has and his BNP has been completely normal.  On admission, the patient's hemoglobin was 11.0 sodium 134, potassium 5.2, BUN 61, creatinine 3.3, calcium 7.8, total protein 4.3, albumin 1.7.  This admission, nephrology and GI have been consulted.  Nephrology feels clinical picture most concerning for nephrotic syndrome and planning for ultrasound-guided renal biopsy early next week.  Serum light chains were checked this admission and his kappa free light chain was mildly elevated at 79.6, lambda free light chains was elevated at 2640, and kappa, lambda light chain ratio was low at 0.03.  Serum protein electrophoresis and UPEP are currently pending.  GI soft secondary to cirrhosis and recommended diagnostic paracentesis and viral hematology studies.  They checked an AFP which was normal at 3.0.  MRI of the liver without contrast was performed earlier today  which was a markedly diminished exam detailed due to a number of factors including motion artifact.  MRI did show moderate bilateral pleural effusions, ascites, and diffuse anasarca.  MRI report did not comment regarding appearance of the liver.  He had a paracentesis performed earlier today with only 100 cc of fluid removed.  This has been sent for cytology which is currently pending.  I met with the patient and his daughter in his hospital room.  He is sitting up in the recliner.  He is somewhat uncomfortable due to widespread anasarca and reports his joints and skin feel tight.  He reports that he has had progressive anasarca over the past few months with worsening renal function.  Initially, swelling was responsive to diuretics but these have been ineffective recently.  He is not having any fevers or chills.  He reports that his appetite has not been very good lately but he is gaining weight due to anasarca.  He currently has no chest discomfort.  He does have some shortness of breath and cough.  His abdomen feels tight.  He has not having any nausea or vomiting.  No bleeding reported.  The patient is married.  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  Hematology was asked to see the patient to make recommendations regarding his elevated serum lambda free light chains.  Past Medical History:  Diagnosis Date   Arthritis    Glaucoma, both eyes    History of MI (myocardial infarction)    2002   Nocturia    PAF (paroxysmal atrial fibrillation) ([place])    Prostate cancer [facility]) urologist-- dr eskridge    T1c, Gleason 3+3, PSA 9.83, vol 61cc  S/P drug eluting coronary stent placement    Type 2 diabetes mellitus ([place])    Wears glasses   :  Past Surgical History:  Procedure Laterality Date   CORONARY ANGIOPLASTY  07-01-2001  dr  Terrence Dupont   PCI to  pLAD, mRCA, ostium D1   CORONARY ANGIOPLASTY  12-04-2001  dr Terrence Dupont   PCI  to  ostial D1,  pLAD   CORONARY ANGIOPLASTY WITH STENT PLACEMENT  09-03-2002  dr  Terrence Dupont   PCI and DES x1 to pLAD   CYSTOSCOPY WITH BIOPSY  10/04/2015   Procedure: CYSTOSCOPY WITH BIOPSY;  Surgeon: Festus Aloe, MD;  Location: [facility];  Service: Urology;;   INTRAVASCULAR PRESSURE WIRE/FFR STUDY N/A 07/23/2017   Procedure: INTRAVASCULAR PRESSURE WIRE/FFR STUDY;  Surgeon: Adrian Prows, MD;  Location: [place] [facility];  Service: Cardiovascular;  Laterality: N/A;   IR PARACENTESIS  08/18/2021   IR THORACENTESIS ASP PLEURAL SPACE W/IMG GUIDE  08/08/2021   LEFT HEART CATH AND CORONARY ANGIOGRAPHY N/A 07/23/2017   Procedure: LEFT HEART CATH AND CORONARY ANGIOGRAPHY;  Surgeon: Adrian Prows, MD;  Location: [facility];  Service: Cardiovascular;  Laterality: N/A;   RADIOACTIVE SEED IMPLANT N/A 10/04/2015   Procedure: RADIOACTIVE SEED IMPLANT/BRACHYTHERAPY IMPLANT;  Surgeon: Festus Aloe, MD;  Location: [facility];  Service: Urology;  Laterality: N/A;   REPAIR  INCARCERATED EPIGASTRIC HERNIA  06-04-2002  :  CURRENT MEDS: Current Facility-Administered Medications  Medication Dose Route Frequency Provider Last Rate Last Admin   0.9 %  sodium chloride infusion  250 mL Intravenous PRN Adrian Prows, MD       0.9 %  sodium chloride infusion  250 mL Intravenous PRN Adrian Prows, MD 10 mL/hr at 08/18/21 0700 250 mL at 08/18/21 0700   0.9 %  sodium chloride infusion   Intravenous Continuous Adrian Prows, MD       0.9 %  sodium chloride infusion   Intravenous Continuous Boisseau, Hayley, PA       acetaminophen (TYLENOL) tablet 650 mg  650 mg Oral Q4H PRN Adrian Prows, MD       albumin human 25 % solution 25 g  25 g Intravenous Q8H Justin Mend, MD 60 mL/hr at 08/18/21 0503 25 g at 08/18/21 0503   atorvastatin (LIPITOR) tablet 40 mg  40 mg Oral Daily Adrian Prows, MD   40 mg at 08/18/21 1038   ezetimibe (ZETIA) tablet 10 mg  10 mg Oral QPC supper Adrian Prows, MD   10 mg at 08/17/21 1608   furosemide (LASIX) 120 mg in dextrose 5 % 50 mL IVPB  120 mg  Intravenous TID WC & HS Justin Mend, MD 62 mL/hr at 08/18/21 0736 120 mg at 08/18/21 0736   heparin injection 5,000 Units  5,000 Units Subcutaneous Q8H Monia Sabal, PA-C   5,000 Units at 08/18/21 0615   hydrALAZINE (APRESOLINE) tablet 25 mg  25 mg Oral Q8H Adrian Prows, MD   25 mg at 08/18/21 0615   isosorbide mononitrate (IMDUR) 24 hr tablet 60 mg  60 mg Oral Daily Adrian Prows, MD   60 mg at 08/18/21 1038   labetalol (NORMODYNE) tablet 100 mg  100 mg Oral BID Adrian Prows, MD   100 mg at 08/18/21 1038   metolazone (ZAROXOLYN) tablet 2.5 mg  2.5 mg Oral Daily Jannifer Hick A, MD   2.5 mg at 08/18/21 1038   ondansetron (ZOFRAN) injection 4 mg  4 mg Intravenous Q6H PRN Adrian Prows,  MD       sodium chloride flush (NS) 0.9 % injection 3 mL  3 mL Intravenous Q12H Adrian Prows, MD   3 mL at 08/18/21 1039   sodium chloride flush (NS) 0.9 % injection 3 mL  3 mL Intravenous PRN Adrian Prows, MD       sodium chloride flush (NS) 0.9 % injection 3 mL  3 mL Intravenous Q12H Adrian Prows, MD   3 mL at 08/17/21 0929   sodium chloride flush (NS) 0.9 % injection 3 mL  3 mL Intravenous PRN Adrian Prows, MD       tamsulosin [facility]) capsule 0.4 mg  0.4 mg Oral Daily Adrian Prows, MD   0.4 mg at 08/18/21 1041   Allergies  Allergen Reactions   Amlodipine Swelling  : Family History  Problem Relation Age of Onset   Colon cancer Mother   : Social History   Socioeconomic History   Marital status: Married    Spouse name: Not on file   Number of children: 2   Years of education: Not on file   Highest education level: Not on file  Occupational History   Occupation: Physician  Tobacco Use   Smoking status: Every Day    Packs/day: 0.50    Years: 40.00    Pack years: 20.00    Types: Cigarettes   Smokeless tobacco: Never  Vaping Use   Vaping Use: Never used  Substance and Sexual Activity   Alcohol use: Yes    Comment: occasionally   Drug use: No   Sexual activity: Not on file  Other Topics Concern   Not on  file  Social History Narrative   Not on file   Social Determinants of Health   Financial Resource Strain: Not on file  Food Insecurity: Not on file  Transportation Needs: Not on file  Physical Activity: Not on file  Stress: Not on file  Social Connections: Not on file  Intimate Partner Violence: Not on file  :  REVIEW OF SYSTEMS:  A comprehensive 14 point review of systems was negative except as noted in the HPI.    Exam: Patient Vitals for the past 24 hrs:  BP Temp Temp src Pulse Resp SpO2 Weight  08/18/21 1137 (!) 98/50 97.6 F (36.4 C) Oral 75 18 96 % --  08/18/21 1025 (!) 117/55 97.8 F (36.6 C) Oral 77 -- 94 % --  08/18/21 0451 109/61 98.4 F (36.9 C) Oral 81 20 92 % 117.1 kg  08/17/21 1900 (!) 130/56 97.7 F (36.5 C) Oral 74 20 93 % --    General: Awake and alert, sitting up in the recliner. Eyes:  no scleral icterus.   ENT: Dry oral mucosa, there were no oropharyngeal lesions.     Lymphatics:  Negative cervical, supraclavicular or axillary adenopathy.   Respiratory: Diminished breath sounds bilaterally Cardiovascular: Regular rate and rhythm, widespread anasarca GI: Positive bowel sounds, abdomen distended   Skin: Weeping upper extremities due to anasarca Neuro exam was nonfocal.  Patient was alert and oriented.  Attention was good.   Language was appropriate.  Mood was normal without depression.  Speech was not pressured.  Thought content was not tangential.    LABS:  Lab Results  Component Value Date   WBC 6.5 08/18/2021   HGB 9.2 (L) 08/18/2021   HCT 28.2 (L) 08/18/2021   PLT 286 08/18/2021   GLUCOSE 89 08/18/2021   CHOL 126 08/16/2021   TRIG 55 08/16/2021   HDL 38 (  L) 08/16/2021   LDLCALC 77 08/16/2021   ALT 11 08/18/2021   AST 15 08/18/2021   NA 135 08/18/2021   K 4.8 08/18/2021   CL 104 08/18/2021   CREATININE 4.06 (H) 08/18/2021   BUN 74 (H) 08/18/2021   CO2 22 08/18/2021   INR 1.0 08/16/2021    DG Chest 1 View  Result Date:  08/08/2021 CLINICAL DATA:  75 year old status post right-sided thoracentesis EXAM: CHEST  1 VIEW COMPARISON:  CT 08/02/2021 FINDINGS: Cardiomediastinal silhouette within normal limits in size and contour. Heart borders partially obscured by overlying lung/pleural disease. Calcifications of the aortic arch. Meniscus at the bilateral lung bases, larger on the left. No pneumothorax. No confluent airspace disease.  No interlobular septal thickening. No displaced fracture IMPRESSION: No complicating features status post right-sided thoracentesis. Small bilateral pleural effusions persist. Electronically Signed   By: Corrie Mckusick D.O.   On: 08/08/2021 11:18   DG Chest 2 View  Result Date: 08/14/2021 CLINICAL DATA:  Dyspnea. EXAM: CHEST - 2 VIEW COMPARISON:  Chest x-ray 08/08/2021. FINDINGS: Heart is enlarged, unchanged. There are atherosclerotic calcifications of the aorta. There are small bilateral pleural effusions, increasing from prior study. There is no evidence for pneumothorax. There some strandy opacities at the lung bases favored is atelectasis. No acute fractures are seen. IMPRESSION: 1. Increasing small bilateral pleural effusions. 2. Stable cardiomegaly. Electronically Signed   By: Ronney Asters M.D.   On: 08/14/2021 21:15   MR LIVER WO CONRTAST  Result Date: 08/18/2021 CLINICAL DATA:  Evaluate for cirrhosis. EXAM: MRI ABDOMEN WITHOUT CONTRAST TECHNIQUE: Multiplanar multisequence MR imaging was performed without the administration of intravenous contrast. COMPARISON:  08/16/2021 FINDINGS: Exam detail is markedly diminished due to a number of factors most importantly however is diffuse motion artifact. Lower chest: There are moderate bilateral pleural effusions with atelectasis within both lower lobes, the right middle lobe and lingula. Hepatobiliary: Imaging of the liver is significantly degraded by motion artifact as well as diffuse body wall edema and ascites. The study is not diagnostic for the  evaluation of underlying liver lesion. Furthermore assessment of intrinsic liver abnormality cannot be adequately assessed. The gallbladder is visualized. No stones noted Pancreas:  Study not diagnostic for the evaluation of the pancreas. Spleen:  Appears normal in size. Adrenals/Urinary Tract: Suboptimally evaluated. A cyst is noted arising off the inferior pole of the left kidney measuring 2.6 cm. Stomach/Bowel: Diffuse gaseous distension of the colon is noted. Vascular/Lymphatic: Study not diagnostic for the assessment of vascular structures or the presence or absence of adenopathy. Other: Abdominal ascites identified. Volume of ascites is difficult to quantify due to factors discussed above. Musculoskeletal: No acute abnormality noted. IMPRESSION: 1. Markedly diminished exam detail due to a number of factors most importantly however, there is diffuse motion artifact. This is most likely secondary to shortness of breath due to bilateral pleural effusions and overall fluid overload state. As mentioned on 08/16/2021, MRI should only be performed when the patient is clinically stable, able to follow directions, and hold their breath. If cross-sectional imaging of the liver is currently indicated on an emergent basis then liver protocol CT without and with contrast material may be more appropriate as this is less susceptible to respiratory motion artifact. 2. Moderate bilateral pleural effusions, ascites, and diffuse anasarca. 3. Diffuse gaseous distension of the colon. Electronically Signed   By: Kerby Moors M.D.   On: 08/18/2021 06:00   US RENAL  Result Date: 08/15/2021 CLINICAL DATA:  Nephrotic syndrome EXAM:  RENAL / URINARY TRACT ULTRASOUND COMPLETE COMPARISON:  None. FINDINGS: Right Kidney: Renal measurements: 10.7 x 4.7 x 5.5 cm. = volume: 144 mL. Echogenicity within normal limits. No mass or hydronephrosis visualized. Left Kidney: Renal measurements: 11.2 x 6.8 x 5.2 cm. = volume: 208 mL. 2.7 cm lower  pole cyst is noted in the left kidney. No mass lesion or hydronephrosis is noted. Bladder: Partially decompressed. The wall is thickened although likely related to the decompression. Other: Diffuse ascites is noted within the abdomen and pelvis. IMPRESSION: Diffuse ascites within the abdomen and pelvis. Normal-appearing kidneys with the exception of a left lower pole renal cyst. Electronically Signed   By: Inez Catalina M.D.   On: 08/15/2021 20:02   VAS US RENAL ARTERY DUPLEX  Result Date: 08/16/2021 ABDOMINAL VISCERAL Patient Name:  Corey Beard [facility]  Date of Exam:   08/16/2021 Medical Rec #: [id number]         Accession #:    [id number] Date of Birth: 06/22/1946          Patient Gender: M Patient Age:   18 years Exam Location:  [facility] Procedure:      VAS US RENAL ARTERY DUPLEX Referring Phys: [id number] LINDSAY A KRUSKA -------------------------------------------------------------------------------- Indications: AKI High Risk Factors: Hypertension, Diabetes. Limitations: Limited study. MD only wanted the renal veins to assess patency. Comparison Study: No prior studies. Performing Technologist: Oliver Hum RVT  Examination Guidelines: A complete evaluation includes B-mode imaging, spectral Doppler, color Doppler, and power Doppler as needed of all accessible portions of each vessel. Bilateral testing is considered an integral part of a complete examination. Limited examinations for reoccurring indications may be performed as noted.  Duplex Findings:  Technologist observations: The right and left renal veins appear patent.  Summary:  *See table(s) above for measurements and observations.  Diagnosing physician: Harold Barban MD  Electronically signed by Harold Barban MD on 08/16/2021 at 4:50:07 PM.    Final    US Abdomen Limited RUQ (LIVER/GB)  Result Date: 08/16/2021 CLINICAL DATA:  Cirrhosis/ascites EXAM: ULTRASOUND ABDOMEN LIMITED RIGHT UPPER QUADRANT COMPARISON:  None. FINDINGS: Gallbladder: No  gallstones or wall thickening visualized. No sonographic Murphy sign noted by sonographer. Common bile duct: Diameter: 1.7 mL Liver: Nodular hepatic contour. No focal lesion identified. Decreased parenchymal echogenicity. Portal vein is patent on color Doppler imaging with normal direction of blood flow towards the liver. Other: At least small volume free fluid. At least trace volume right pleural effusion. IMPRESSION: 1. Cirrhosis. Recommend MRI liver protocol for further evaluation of the hepatic parenchyma. When the patient is clinically stable and able to follow directions and hold their breath (preferably as an outpatient) further evaluation with dedicated abdominal MRI should be considered. 2. At least small volume ascites. 3. At least trace volume right pleural effusion. Electronically Signed   By: Iven Finn M.D.   On: 08/16/2021 18:55   IR Paracentesis  Result Date: 08/18/2021 INDICATION: Patient with history of AKI, newly diagnosed cirrhosis, and anasarca. Request for diagnostic only paracentesis with a maximum drain of 100 mL. EXAM: ULTRASOUND GUIDED DIAGNOSTIC PARACENTESIS MEDICATIONS: 10 mL 1% lidocaine COMPLICATIONS: None immediate. PROCEDURE: Informed written consent was obtained from the patient after a discussion of the risks, benefits and alternatives to treatment. A timeout was performed prior to the initiation of the procedure. Initial ultrasound scanning demonstrates a moderate amount of ascites within the right upper abdominal quadrant. The right upper abdomen was prepped and draped in the usual sterile fashion. 1% lidocaine was  used for local anesthesia. Following this, a 6 Fr Safe-T-Centesis catheter was introduced. An ultrasound image was saved for documentation purposes. The paracentesis was performed. The catheter was removed and a dressing was applied. The patient tolerated the procedure well without immediate post procedural complication. FINDINGS: A total of approximately 100  mL of hazy, yellow fluid was removed. Samples were sent to the laboratory as requested by the clinical team. Per order maximum of 100 mL was removed from right upper quadrant even though there was a moderate amount of ascites noted on ultrasound images remaining. IMPRESSION: Successful ultrasound-guided paracentesis yielding 100 mL of peritoneal fluid. Read by: Narda Rutherford, NP Electronically Signed   By: Miachel Roux M.D.   On: 08/18/2021 11:11   IR THORACENTESIS ASP PLEURAL SPACE W/IMG GUIDE  Result Date: 08/08/2021 INDICATION: Pt with hx of CAD, HLD, HTN, prostate cancer and edema with recent anasarca and fluid overload with pleural effusions. Request for diagnostic and therapeutic thoracentesis. EXAM: ULTRASOUND GUIDED DIAGNOSTIC AND THERAPEUTIC THORACENTESIS MEDICATIONS: 33m 1% lidocaine COMPLICATIONS: None immediate. PROCEDURE: An ultrasound guided thoracentesis was thoroughly discussed with the patient and questions answered. The benefits, risks, alternatives and complications were also discussed. The patient understands and wishes to proceed with the procedure. Written consent was obtained. Ultrasound was performed to localize and mark an adequate pocket of fluid in the right chest. The area was then prepped and draped in the normal sterile fashion. 1% Lidocaine was used for local anesthesia. Under ultrasound guidance a 6 Fr Safe-T-Centesis catheter was introduced. Thoracentesis was performed. The catheter was removed and a dressing applied. FINDINGS: A total of approximately 2 L of clear, yellow fluid was removed. Samples were sent to the laboratory as requested by the clinical team. IMPRESSION: Successful ultrasound guided right thoracentesis yielding 2 L of pleural fluid. Electronically Signed   By: JCorrie MckusickD.O.   On: 08/08/2021 13:05     ASSESSMENT AND PLAN:  1.  Elevated lambda light chain with normal kappa, lambda light chain ratio 2.  Widespread anasarca 3.  Normocytic anemia 4.   AKI 5.  Proteinuria 6.  Cirrhosis 7.  Diabetes mellitus 8.  History of prostate adenocarcinoma diagnosed 2016 (Gleason score 3+3=6) 9.  History of low-grade papillary urothelial carcinoma diagnosed 2016 10.  Hypertension 11.  CAD 18  Hyperlipidemia  -Discussed lab findings to date with the patient and his daughter.  The patient was found to have elevated serum lambda light chains with low kappa, lambda light chain ratio.  SPEP and UPEP are currently pending.  Given his underlying renal disease with GFR of only about 15, difficult to interpret if elevated lambda light chain may be due to to underlying plasma cell disorder versus decreased clearance of light chains in the setting of AKI. -Will await results of SPEP and UPEP. -He is scheduled for a renal biopsy early next week.  Further recommendations per Dr. EMarin Olpregarding need for bone marrow biopsy versus waiting on results from renal biopsy before making further recommendations.  Thank you for this referral.  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  He has nephrotic syndrome.  He has been in the hospital since 08/14/2021.  His work-up showed that he had a renal insufficiency.  He had a large amount of lambda light chain in the urine.  He is scheduled for a kidney biopsy on  Monday.  He did have a thoracentesis done on the right side back on 08/08/2021.  Pathology on the fluid did not show any malignancy.  He had a paracentesis done today.  The fluid is clearly transudate.  He has cirrhosis.  I am not sure as to what the unifying diagnosis might be but amyloidosis could certainly be a possibility.  He said that he had a 24-hour urine done.  Hopefully this was sent for immunoelectrophoresis to see if there is monoclonal lambda light chains in the urine.  A kidney biopsy will clearly show [place] if there is  amyloidosis.  He will need to have a bone marrow biopsy done.  Right now, it is difficult to say whether or not there is an underlying hematologic malignancy.  However, amyloidosis could certainly be a unifying diagnosis.  I wonder if he needs an echocardiogram to look at his heart to see if there is any type of echocardiogram features suggestive of amyloidosis.  His daughter was with him.  She lives in [place].  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  We will follow along.  We just have to wait the results back from the various biopsies in the 24-hour urine.  He did have an SPEP that had a minimal monoclonal spike.  This is of unclear significance right now.  I do appreciate the opportunity to of seeing Corey Beard.  Lattie Haw, MD  Psalms  119:17

## 2021-08-18 NOTE — Progress Notes (Signed)
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 PMH: HTN, DM2, prostate ca, tobacco use, carotid stenosis, non obstructive CAD, glaucoma, arthritis and MI.   OT comments  Patient up in recliner and agreeable to work with OT. Patient was min assist to stand from recliner and ambulated to bathroom. Patient stood to urinate and stood at sink for hand hygiene. Patient was asked to perform more grooming and he stated that he did more than he wanted to and asked to return to recliner.  Patient positioned in recliner to address LE edema.  Acute OT to continue to follow.     Recommendations for follow up therapy are one component of a multi-disciplinary discharge planning process, led by the attending physician.  Recommendations may be updated based on patient status, additional functional criteria and insurance authorization.    Follow Up Recommendations  Home health OT    Equipment Recommendations  None recommended by OT    Recommendations for Other Services      Precautions / Restrictions Precautions Precautions: Fall       Mobility Bed Mobility               General bed mobility comments: up in recliner    Transfers Overall transfer level: Needs assistance Equipment used: Rolling walker (2 wheeled) Transfers: Sit to/from Stand Sit to Stand: Supervision         General transfer comment: cues for safety    Balance Overall balance assessment: Needs assistance   Sitting balance-Leahy Scale: Good     Standing balance support: During functional activity Standing balance-Leahy Scale: Fair Standing balance comment: able to stand in bathroom to urinate                           ADL either performed or assessed with  clinical judgement   ADL Overall ADL's : Needs assistance/impaired     Grooming: Supervision/safety;Standing;Wash/dry hands Grooming Details (indicate cue type and reason): demonstrated good balance                             Functional mobility during ADLs: Supervision/safety;Rolling walker;Min guard General ADL Comments: patient stated he did not want to do any more therapy after going to bathroom and doing grooming.     Vision       Perception     Praxis      Cognition Arousal/Alertness: Awake/alert Behavior During Therapy: WFL for tasks assessed/performed Overall Cognitive Status: Within Functional Limits for tasks assessed                                 General Comments: patient oriented x3        Exercises     Shoulder Instructions       General Comments      Pertinent Vitals/ Pain       Pain Assessment: Faces Faces Pain Scale: Hurts little more Pain Location: generalized Pain Descriptors / Indicators: Grimacing;Discomfort Pain Intervention(s): Monitored during session  Home Living  Prior Functioning/Environment              Frequency  Min 2X/week        Progress Toward Goals  OT Goals(current goals can now be found in the care plan section)  Progress towards OT goals: Progressing toward goals  Acute Rehab OT Goals Patient Stated Goal: return home OT Goal Formulation: With patient Time For Goal Achievement: 08/30/21 Potential to Achieve Goals: Good ADL Goals Pt Will Perform Grooming: with modified independence;standing Pt Will Perform Lower Body Bathing: with modified independence;with adaptive equipment;sit to/from stand Pt Will Perform Lower Body Dressing: with modified independence;sit to/from stand;with adaptive equipment Pt Will Transfer to Toilet: with modified independence;ambulating Pt Will Perform Toileting - Clothing Manipulation and  hygiene: with modified independence;sit to/from stand Pt Will Perform Tub/Shower Transfer: with supervision;ambulating;shower seat;rolling walker;Shower transfer Additional ADL Goal #1: Pt will generalize energy conservation strategies in ADL and mobility independently.  Plan Discharge plan remains appropriate    Co-evaluation                 AM-PAC OT "6 Clicks" Daily Activity     Outcome Measure   Help from another person eating meals?: None Help from another person taking care of personal grooming?: A Little Help from another person toileting, which includes using toliet, bedpan, or urinal?: A Little Help from another person bathing (including washing, rinsing, drying)?: A Lot Help from another person to put on and taking off regular upper body clothing?: None Help from another person to put on and taking off regular lower body clothing?: A Lot 6 Click Score: 18    End of Session Equipment Utilized During Treatment: Rolling walker  OT Visit Diagnosis: Other abnormalities of gait and mobility (R26.89)   Activity Tolerance Patient tolerated treatment well   Patient Left in chair;with call bell/phone within reach;with family/visitor present   Nurse Communication Mobility status        Time: 1505-6979 OT Time Calculation (min): 22 min  Charges: OT General Charges $OT Visit: 1 Visit OT Treatments $Self Care/Home Management : 8-22 mins  Lodema Hong, OTA   Trixie Dredge 08/18/2021, 12:51 PM

## 2021-08-18 NOTE — Progress Notes (Signed)
PROGRESS NOTE    Corey Beard  AJO:[id number] DOB: [birth year] DOA: 08/14/2021 PCP: Benito Mccreedy, MD    No chief complaint on file.   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 He is found to have bilateral pleural effusions R>L, with recent 2 L thoracentesis performed on 08/07/2021. Cr is up to 3.3 from baseline of 1.2-1.3 a year ago. He has 3+ proteinuria.  He was initially admitted by cardiology, transferred to [facility].  Nephrology consulted for evaluation of nephrotic syndrome. Further evaluation revealed cirrhosis of the liver.  GI consulted for evaluation of hepatorenal syndrome.  Assessment & Plan:   Principal Problem:   Nephrotic syndrome Active Problems:   Malignant neoplasm of prostate (HCC)   Coronary artery disease   Essential hypertension   Dyslipidemia   Anasarca with bilateral pleural effusions, proteinuria, AKI,  ascites and hypoalbuminemia: - differential include CHF vs nephrotic syndrome vs liver cirrhosis. - echocardiogram ruled out decompensated CHF.  - US renal Normal-appearing kidneys with the exception of a left lower pole renal cyst. - Renal biopsy ordered scheduled for Monday as patient has been on aspirin., nephrology on board for evaluation of nephrotic syndrome.  - diuresis with IV lasix 120 mg 3 times daily along with albumin infusions, AND metolazone added to the regimen.  - protein electrophoresis show increased kappa and lambda light chains 79/2640.  Complements are wnl. PTH is intact. Rpr is non reactive.  - heme onc consulted for evaluation for MM.  - US liver reveals cirrhosis, will follow up with an MRI with liver protocol. - Meanwhile GI consulted for new diagnosis of cirrhosis. US paracentesis ordered for further eval.    AKI:  ? Nephrotic syndrome vs HRS   Creatinine at baseline around 1.2 to 1.3, admitted with a  creatinine around 3.3 <3.56 <3.77. <4 Potassium wnl Urine output around 1100 in the last 24 hours.  Currently on IV lasix and metolazone.     Hypertension:  - on hydralazine, imdur and lasix.    Hyperlipidemia Resume lipitor and zetia.   CAD;  No chest pain  On aspirin held.    Abnormal TSH.  TSH is 7.659, free T3 is 1.3 and free t4 wnl. Recommend checking thyroid panel in 4 weeks.    BPH:  On flomax.   Abnormal UA with urine culture showing multiple species. Repeat cultures are ordered.  Did not start the patient on IV antibiotics as pt is asymptomatic.   Bilateral upper and lower extremity edema from the anasarca with weeping  - IV albumin infusions ordered.    Ascites  US paracentesis ordered and fluid analysis sent.   Body mass index is 34.07 kg/m. Obesity:  Fluid overloaded.  Continue to monitor.    DVT prophylaxis: (Heparin) Code Status: Partial code.  Family Communication: none at bedside.  Disposition:   Status is: Inpatient  Remains inpatient appropriate because:Ongoing diagnostic testing needed not appropriate for outpatient work up, Unsafe d/c plan, and IV treatments appropriate due to intensity of illness or inability to take PO  Dispo: The patient is from: Home              Anticipated d/c is to:  pending.              Patient currently is not medically stable to d/c.   Difficult to place patient No  Consultants:  Nephrology.  Gastroenterology  Hematology oncology.   Procedures:  US liver.  US RENAL.  ECHO. Severe concentric hypertrophy of  the left ventricle. Normal global wall motion. Normal LV systolic function  with EF 58%. Doppler evidence of grade I (impaired) diastolic dysfunction,  normal LAP. Left atrial cavity is mildly dilated.   Antimicrobials: none    Subjective: Sob, on RA,  Fluid overloaded.   Objective: Vitals:   08/17/21 1900 08/18/21 0451  08/18/21 1025 08/18/21 1137  BP: (!) 130/56 109/61 (!) 117/55 (!) 98/50  Pulse: 74 81 77 75  Resp: 20 20  18   Temp: 97.7 F (36.5 C) 98.4 F (36.9 C) 97.8 F (36.6 C) 97.6 F (36.4 C)  TempSrc: Oral Oral Oral Oral  SpO2: 93% 92% 94% 96%  Weight:  117.1 kg    Height:        Intake/Output Summary (Last 24 hours) at 08/18/2021 1313 Last data filed at 08/18/2021 1039 Gross per 24 hour  Intake 853.28 ml  Output 1125 ml  Net -271.72 ml    Filed Weights   08/16/21 0350 08/17/21 0459 08/18/21 0451  Weight: 118.7 kg 117.5 kg 117.1 kg    Examination:  General exam: Ill-appearing gentleman, not in any kind of distress Respiratory system: Diminished air entry at bases on room air, some tachypnea on minimal activity Cardiovascular system: S1 & S2 heard, RRR.,  Edematous lower extremities and upper extremities, JVD cannot be appreciated Gastrointestinal system: Abdomen is soft distended nontender bowel sounds Central nervous system: Alert and oriented. No focal neurological deficits. Extremities: Edema that is upper extremity and lower extremities Skin: Anasarca with weeping upper extremities Psychiatry: Mood is appropriate     Data Reviewed: I have personally reviewed following labs and imaging studies  CBC: Recent Labs  Lab 08/14/21 2118 08/16/21 0404 08/18/21 0301  WBC 8.2  --  6.5  HGB 11.0* 8.9* 9.2*  HCT 33.2*  --  28.2*  MCV 87.4  --  87.0  PLT 291  --  286     Basic Metabolic Panel: Recent Labs  Lab 08/14/21 2118 08/15/21 0243 08/16/21 0404 08/17/21 0221 08/18/21 0301  NA 134* 135 136 135 135  K 5.2* 4.7 4.7 5.0 4.8  CL 106 105 106 105 104  CO2 23 23 22  21* 22  GLUCOSE 83 88 82 92 89  BUN 61* 61* 65* 68* 74*  CREATININE 3.31* 3.17* 3.56* 3.77* 4.06*  CALCIUM 7.8* 7.6* 7.9*  7.9* 8.1* 8.3*  PHOS  --   --  4.9*  --   --      GFR: Estimated Creatinine Clearance: 21.7 mL/min (A) (by C-G formula based on SCr of 4.06 mg/dL (H)).  Liver Function  Tests: Recent Labs  Lab 08/14/21 2118 08/16/21 0404 08/17/21 0221 08/18/21 0301  AST 22  --  19 15  ALT 13  --  12 11  ALKPHOS 60  --  63 48  BILITOT 0.4  --  0.5 0.6  PROT 4.3*  --  4.4* 4.5*  ALBUMIN 1.7* 1.5* 1.6* 2.2*  2.2*     CBG: Recent Labs  Lab 08/15/21 2142 08/16/21 0550  GLUCAP 332* 85      Recent Results (from the past 240 hour(s))  Resp Panel by RT-PCR (Flu A&B, Covid) Nasopharyngeal Swab     Status: None   Collection Time: 08/14/21  7:57 PM   Specimen: Nasopharyngeal Swab; Nasopharyngeal(NP) swabs in vial transport medium  Result Value Ref Range Status  SARS Coronavirus 2 by RT PCR NEGATIVE NEGATIVE Final    Comment: (NOTE) SARS-CoV-2 target nucleic acids are NOT DETECTED.  The SARS-CoV-2 RNA is generally detectable in upper respiratory specimens during the acute phase of infection. The lowest concentration of SARS-CoV-2 viral copies this assay can detect is 138 copies/mL. A negative result does not preclude SARS-Cov-2 infection and should not be used as the sole basis for treatment or other patient management decisions. A negative result may occur with  improper specimen collection/handling, submission of specimen other than nasopharyngeal swab, presence of viral mutation(s) within the areas targeted by this assay, and inadequate number of viral copies(<138 copies/mL). A negative result must be combined with clinical observations, patient history, and epidemiological information. The expected result is Negative.  Fact Sheet for Patients:  EntrepreneurPulse.com.au  Fact Sheet for Healthcare Providers:  IncredibleEmployment.be  This test is no t yet approved or cleared by the [place] FDA and  has been authorized for detection and/or diagnosis of SARS-CoV-2 by FDA under an Emergency Use Authorization (EUA). This EUA will remain  in effect (meaning this test can be used) for the duration of the COVID-19  declaration under Section 564(b)(1) of the Act, 21 U.S.C.section 360bbb-3(b)(1), unless the authorization is terminated  or revoked sooner.       Influenza A by PCR NEGATIVE NEGATIVE Final   Influenza B by PCR NEGATIVE NEGATIVE Final    Comment: (NOTE) The Xpert Xpress SARS-CoV-2/FLU/RSV plus assay is intended as an aid in the diagnosis of influenza from Nasopharyngeal swab specimens and should not be used as a sole basis for treatment. Nasal washings and aspirates are unacceptable for Xpert Xpress SARS-CoV-2/FLU/RSV testing.  Fact Sheet for Patients: EntrepreneurPulse.com.au  Fact Sheet for Healthcare Providers: IncredibleEmployment.be  This test is not yet approved or cleared by the [place] FDA and has been authorized for detection and/or diagnosis of SARS-CoV-2 by FDA under an Emergency Use Authorization (EUA). This EUA will remain in effect (meaning this test can be used) for the duration of the COVID-19 declaration under Section 564(b)(1) of the Act, 21 U.S.C. section 360bbb-3(b)(1), unless the authorization is terminated or revoked.  Performed at [facility], [place] [address], [place], [place] [address]   Urine Culture     Status: Abnormal   Collection Time: 08/15/21  7:56 AM   Specimen: Urine, Clean Catch  Result Value Ref Range Status   Specimen Description URINE, CLEAN CATCH  Final   Special Requests   Final    Normal Performed at [facility], [place] [address] [place], [place] [address]    Culture MULTIPLE SPECIES PRESENT, SUGGEST RECOLLECTION (A)  Final   Report Status 08/16/2021 FINAL  Final  Urine Culture     Status: Abnormal   Collection Time: 08/17/21  5:24 AM   Specimen: Urine, Clean Catch  Result Value Ref Range Status   Specimen Description URINE, CLEAN CATCH  Final   Special Requests   Final    NONE Performed at [facility], [place] [address] [place], [place] [address]    Culture MULTIPLE  SPECIES PRESENT, SUGGEST RECOLLECTION (A)  Final   Report Status 08/18/2021 FINAL  Final  Gram stain     Status: None   Collection Time: 08/18/21  9:27 AM   Specimen: Abdomen; Peritoneal Fluid  Result Value Ref Range Status   Specimen Description PERITONEAL FLUID  Final   Special Requests NONE  Final   Gram Stain   Final  WBC PRESENT, PREDOMINANTLY MONONUCLEAR NO ORGANISMS SEEN CYTOSPIN SMEAR Performed at [facility], [place] [address], [place], [place] [address]    Report Status 08/18/2021 FINAL  Final          Radiology Studies: MR LIVER WO CONRTAST  Result Date: 08/18/2021 CLINICAL DATA:  Evaluate for cirrhosis. EXAM: MRI ABDOMEN WITHOUT CONTRAST TECHNIQUE: Multiplanar multisequence MR imaging was performed without the administration of intravenous contrast. COMPARISON:  08/16/2021 FINDINGS: Exam detail is markedly diminished due to a number of factors most importantly however is diffuse motion artifact. Lower chest: There are moderate bilateral pleural effusions with atelectasis within both lower lobes, the right middle lobe and lingula. Hepatobiliary: Imaging of the liver is significantly degraded by motion artifact as well as diffuse body wall edema and ascites. The study is not diagnostic for the evaluation of underlying liver lesion. Furthermore assessment of intrinsic liver abnormality cannot be adequately assessed. The gallbladder is visualized. No stones noted Pancreas:  Study not diagnostic for the evaluation of the pancreas. Spleen:  Appears normal in size. Adrenals/Urinary Tract: Suboptimally evaluated. A cyst is noted arising off the inferior pole of the left kidney measuring 2.6 cm. Stomach/Bowel: Diffuse gaseous distension of the colon is noted. Vascular/Lymphatic: Study not diagnostic for the assessment of vascular structures or the presence or absence of adenopathy. Other: Abdominal ascites identified. Volume of ascites is difficult to quantify due to factors  discussed above. Musculoskeletal: No acute abnormality noted. IMPRESSION: 1. Markedly diminished exam detail due to a number of factors most importantly however, there is diffuse motion artifact. This is most likely secondary to shortness of breath due to bilateral pleural effusions and overall fluid overload state. As mentioned on 08/16/2021, MRI should only be performed when the patient is clinically stable, able to follow directions, and hold their breath. If cross-sectional imaging of the liver is currently indicated on an emergent basis then liver protocol CT without and with contrast material may be more appropriate as this is less susceptible to respiratory motion artifact. 2. Moderate bilateral pleural effusions, ascites, and diffuse anasarca. 3. Diffuse gaseous distension of the colon. Electronically Signed   By: Kerby Moors M.D.   On: 08/18/2021 06:00   US Abdomen Limited RUQ (LIVER/GB)  Result Date: 08/16/2021 CLINICAL DATA:  Cirrhosis/ascites EXAM: ULTRASOUND ABDOMEN LIMITED RIGHT UPPER QUADRANT COMPARISON:  None. FINDINGS: Gallbladder: No gallstones or wall thickening visualized. No sonographic Murphy sign noted by sonographer. Common bile duct: Diameter: 1.7 mL Liver: Nodular hepatic contour. No focal lesion identified. Decreased parenchymal echogenicity. Portal vein is patent on color Doppler imaging with normal direction of blood flow towards the liver. Other: At least small volume free fluid. At least trace volume right pleural effusion. IMPRESSION: 1. Cirrhosis. Recommend MRI liver protocol for further evaluation of the hepatic parenchyma. When the patient is clinically stable and able to follow directions and hold their breath (preferably as an outpatient) further evaluation with dedicated abdominal MRI should be considered. 2. At least small volume ascites. 3. At least trace volume right pleural effusion. Electronically Signed   By: Iven Finn M.D.   On: 08/16/2021 18:55   IR  Paracentesis  Result Date: 08/18/2021 INDICATION: Patient with history of AKI, newly diagnosed cirrhosis, and anasarca. Request for diagnostic only paracentesis with a maximum drain of 100 mL. EXAM: ULTRASOUND GUIDED DIAGNOSTIC PARACENTESIS MEDICATIONS: 10 mL 1% lidocaine COMPLICATIONS: None immediate. PROCEDURE: Informed written consent was obtained from the patient after a discussion of the risks, benefits and alternatives to treatment.  A timeout was performed prior to the initiation of the procedure. Initial ultrasound scanning demonstrates a moderate amount of ascites within the right upper abdominal quadrant. The right upper abdomen was prepped and draped in the usual sterile fashion. 1% lidocaine was used for local anesthesia. Following this, a 6 Fr Safe-T-Centesis catheter was introduced. An ultrasound image was saved for documentation purposes. The paracentesis was performed. The catheter was removed and a dressing was applied. The patient tolerated the procedure well without immediate post procedural complication. FINDINGS: A total of approximately 100 mL of hazy, yellow fluid was removed. Samples were sent to the laboratory as requested by the clinical team. Per order maximum of 100 mL was removed from right upper quadrant even though there was a moderate amount of ascites noted on ultrasound images remaining. IMPRESSION: Successful ultrasound-guided paracentesis yielding 100 mL of peritoneal fluid. Read by: Narda Rutherford, NP Electronically Signed   By: Miachel Roux M.D.   On: 08/18/2021 11:11        Scheduled Meds:  atorvastatin  40 mg Oral Daily   ezetimibe  10 mg Oral QPC supper   heparin  5,000 Units Subcutaneous Q8H   hydrALAZINE  25 mg Oral Q8H   isosorbide mononitrate  60 mg Oral Daily   labetalol  100 mg Oral BID   metolazone  2.5 mg Oral Daily   sodium chloride flush  3 mL Intravenous Q12H   sodium chloride flush  3 mL Intravenous Q12H   tamsulosin  0.4 mg Oral Daily   Continuous  Infusions:  sodium chloride     sodium chloride 250 mL (08/18/21 0700)   sodium chloride     sodium chloride     albumin human 25 g (08/18/21 0503)   furosemide 120 mg (08/18/21 0736)     LOS: 4 days        Hosie Poisson, MD [facility]   To contact the attending provider between 7A-7P or the covering provider during after hours 7P-7A, please log into the web site [email or web address] and access using universal [place] password for that web site. If you do not have the password, please call the hospital operator.  08/18/2021, 1:13 PM

## 2021-08-18 NOTE — Progress Notes (Signed)
PT Cancellation Note  Patient Details Name: JALEEN FINCH MRN: [id number] DOB: 1946-01-04   Cancelled Treatment:    Reason Eval/Treat Not Completed: Patient declined, no reason specified. Pt reports still being too tired to do any supine exercises or mobility at this time and requesting no PT this date. Will plan to follow-up another day as able.   Moishe Spice, PT, DPT Acute Rehabilitation Services  Pager: [phone] Office: [place] 08/18/2021, 5:17 PM

## 2021-08-18 NOTE — Plan of Care (Signed)

## 2021-08-18 NOTE — Progress Notes (Signed)
Pt had a 3.6 second pause. Pt asymptomatic and sleeping. MD paged.

## 2021-08-18 NOTE — Progress Notes (Signed)
[facility] Progress Note  Assessment/Plan **AKI, worsening:  Cr 1.3 earlier in the year and now trending up from 3s to 4s in the past few week.  Clinical picture initially most concerning for nephrotic syndrome with AKI though UP/C showing non nephrotic proteinuria with UP/C 1.65, lipids ok. Renal US normal size and echogenicity.  Renal vein dopplers ok.  Pending numerous serologies, and plans for IR US guided renal biopsy after ASA off x 3 days - likely Monday.  Serologic eval has been unremarkable to date but now see K/L FLC abnormal - elevated lambda chains; will need to involve hematology and if MM likely may not need renal biopsy as it will not affect treatment.  Will continue to hold ASA in case renal biopsy remains necessary.  Hold ARB, avoid nephrotoxins.     **Proteinuria:  3+ on UA, UP/C 1.65  Holding ARB in setting of AKI for now.  W/u per above with elevated lambda light chains - will have hematology consult.   **Cirrhosis:  new dx.  No h/o liver dz.  GI consulting and eval etiology.  ?EtOH +/- NASH.  No significant portal hypertension so unlikely this is driving the ansarca.    **Anasarca:  Still suboptimal.  low na diet.  Cont lasix 120q8 and albumin IV prior to doses and add metolazone 2.5 dily given suboptimal diuresis. Next step would be to max lasix and add thiazide.  Strict I/Os, daily weights.  Eval underlying issue per above.     **DM:  per primary.     **h/o prostatitis:  asymptomatic currently but UA suggestive of infection - culture initially multiple species, culture resent.     Will follow closely, call with concerns.  ________________________________________________________________________________ Subjective:   UOP increased to 1.1L but only net neg 0.3L.  Dyspnea better than yesterday.  No new symptoms.  Dismayed by continued decline in renal function.   Objective Vitals:   08/17/21 0824 08/17/21 1224 08/17/21 1900 08/18/21 0451  BP: 137/90 (!)  119/55 (!) 130/56 109/61  Pulse: 81 75 74 81  Resp:   20 20  Temp:   97.7 F (36.5 C) 98.4 F (36.9 C)  TempSrc:   Oral Oral  SpO2: 95% 92% 93% 92%  Weight:    117.1 kg  Height:       Physical Exam Gen: comfortable in bed  Eyes: anicteric, EOMI, +glasses ENT: MMM, no oral ulcers Neck: supple CV:  RRR, no rub Abd: soft, +abd wall edema, mod distended, nontender Lungs: normal WOB sitting at rest, diminished BL 1/3 lung fields GU: no foley - has condom cath with skin breakdown around SP edema; scrotal edema Extr:  3+ pitting edema throughout Neuro: nonfocal Skin: no rashes, has some denuded skin L AC fossa and some small blisters noted L forearm; Legs without blistering  Additional Objective Labs: Basic Metabolic Panel: Recent Labs  Lab 08/16/21 0404 08/17/21 0221 08/18/21 0301  NA 136 135 135  K 4.7 5.0 4.8  CL 106 105 104  CO2 22 21* 22  GLUCOSE 82 92 89  BUN 65* 68* 74*  CREATININE 3.56* 3.77* 4.06*  CALCIUM 7.9*  7.9* 8.1* 8.3*  PHOS 4.9*  --   --     Liver Function Tests: Recent Labs  Lab 08/14/21 2118 08/16/21 0404 08/17/21 0221 08/18/21 0301  AST 22  --  19 15  ALT 13  --  12 11  ALKPHOS 60  --  63 48  BILITOT 0.4  --  0.5 0.6  PROT 4.3*  --  4.4* 4.5*  ALBUMIN 1.7* 1.5* 1.6* 2.2*    No results for input(s): LIPASE, AMYLASE in the last 168 hours. CBC: Recent Labs  Lab 08/14/21 2118 08/16/21 0404 08/18/21 0301  WBC 8.2  --  6.5  HGB 11.0* 8.9* 9.2*  HCT 33.2*  --  28.2*  MCV 87.4  --  87.0  PLT 291  --  286    Blood Culture    Component Value Date/Time   SDES URINE, CLEAN CATCH 08/17/2021 0524   SPECREQUEST  08/17/2021 0524    NONE Performed at [facility], [place] [address] [place], [place] [address]    CULT MULTIPLE SPECIES PRESENT, SUGGEST RECOLLECTION (A) 08/17/2021 0524   REPTSTATUS 08/18/2021 FINAL 08/17/2021 0524    Cardiac Enzymes: No results for input(s): CKTOTAL, CKMB, CKMBINDEX, TROPONINI in the last 168  hours. CBG: Recent Labs  Lab 08/15/21 2142 08/16/21 0550  GLUCAP 332* 85    Iron Studies:  Recent Labs    08/16/21 0404 08/18/21 0301  IRON 28*  --   TIBC 167*  --   FERRITIN  --  73   @[email or web address] @ Studies/Results: MR LIVER WO CONRTAST  Result Date: 08/18/2021 CLINICAL DATA:  Evaluate for cirrhosis. EXAM: MRI ABDOMEN WITHOUT CONTRAST TECHNIQUE: Multiplanar multisequence MR imaging was performed without the administration of intravenous contrast. COMPARISON:  08/16/2021 FINDINGS: Exam detail is markedly diminished due to a number of factors most importantly however is diffuse motion artifact. Lower chest: There are moderate bilateral pleural effusions with atelectasis within both lower lobes, the right middle lobe and lingula. Hepatobiliary: Imaging of the liver is significantly degraded by motion artifact as well as diffuse body wall edema and ascites. The study is not diagnostic for the evaluation of underlying liver lesion. Furthermore assessment of intrinsic liver abnormality cannot be adequately assessed. The gallbladder is visualized. No stones noted Pancreas:  Study not diagnostic for the evaluation of the pancreas. Spleen:  Appears normal in size. Adrenals/Urinary Tract: Suboptimally evaluated. A cyst is noted arising off the inferior pole of the left kidney measuring 2.6 cm. Stomach/Bowel: Diffuse gaseous distension of the colon is noted. Vascular/Lymphatic: Study not diagnostic for the assessment of vascular structures or the presence or absence of adenopathy. Other: Abdominal ascites identified. Volume of ascites is difficult to quantify due to factors discussed above. Musculoskeletal: No acute abnormality noted. IMPRESSION: 1. Markedly diminished exam detail due to a number of factors most importantly however, there is diffuse motion artifact. This is most likely secondary to shortness of breath due to bilateral pleural effusions and overall fluid overload state. As mentioned on  08/16/2021, MRI should only be performed when the patient is clinically stable, able to follow directions, and hold their breath. If cross-sectional imaging of the liver is currently indicated on an emergent basis then liver protocol CT without and with contrast material may be more appropriate as this is less susceptible to respiratory motion artifact. 2. Moderate bilateral pleural effusions, ascites, and diffuse anasarca. 3. Diffuse gaseous distension of the colon. Electronically Signed   By: Kerby Moors M.D.   On: 08/18/2021 06:00   VAS US RENAL ARTERY DUPLEX  Result Date: 08/16/2021 ABDOMINAL VISCERAL Patient Name:  Corey [facility]ard [facility]  Date of Exam:   08/16/2021 Medical Rec #: [id number]         Accession #:    [id number] Date of Birth: January 04, 1946          Patient Gender: M Patient  Age:   50 years Exam Location:  [facility] Procedure:      VAS US RENAL ARTERY DUPLEX Referring Phys: [id number] Jelisha Weed A Liat Mayol -------------------------------------------------------------------------------- Indications: AKI High Risk Factors: Hypertension, Diabetes. Limitations: Limited study. MD only wanted the renal veins to assess patency. Comparison Study: No prior studies. Performing Technologist: Oliver Hum RVT  Examination Guidelines: A complete evaluation includes B-mode imaging, spectral Doppler, color Doppler, and power Doppler as needed of all accessible portions of each vessel. Bilateral testing is considered an integral part of a complete examination. Limited examinations for reoccurring indications may be performed as noted.  Duplex Findings:  Technologist observations: The right and left renal veins appear patent.  Summary:  *See table(s) above for measurements and observations.  Diagnosing physician: Harold Barban MD  Electronically signed by Harold Barban MD on 08/16/2021 at 4:50:07 PM.    Final    US Abdomen Limited RUQ (LIVER/GB)  Result Date: 08/16/2021 CLINICAL DATA:  Cirrhosis/ascites EXAM:  ULTRASOUND ABDOMEN LIMITED RIGHT UPPER QUADRANT COMPARISON:  None. FINDINGS: Gallbladder: No gallstones or wall thickening visualized. No sonographic Murphy sign noted by sonographer. Common bile duct: Diameter: 1.7 mL Liver: Nodular hepatic contour. No focal lesion identified. Decreased parenchymal echogenicity. Portal vein is patent on color Doppler imaging with normal direction of blood flow towards the liver. Other: At least small volume free fluid. At least trace volume right pleural effusion. IMPRESSION: 1. Cirrhosis. Recommend MRI liver protocol for further evaluation of the hepatic parenchyma. When the patient is clinically stable and able to follow directions and hold their breath (preferably as an outpatient) further evaluation with dedicated abdominal MRI should be considered. 2. At least small volume ascites. 3. At least trace volume right pleural effusion. Electronically Signed   By: Iven Finn M.D.   On: 08/16/2021 18:55   Medications:  sodium chloride     sodium chloride 250 mL (08/18/21 0700)   sodium chloride     sodium chloride     albumin human 25 g (08/18/21 0503)   furosemide 120 mg (08/18/21 0736)    atorvastatin  40 mg Oral Daily   ezetimibe  10 mg Oral QPC supper   heparin  5,000 Units Subcutaneous Q8H   hydrALAZINE  25 mg Oral Q8H   isosorbide mononitrate  60 mg Oral Daily   labetalol  100 mg Oral BID   metolazone  2.5 mg Oral Daily   sodium chloride flush  3 mL Intravenous Q12H   sodium chloride flush  3 mL Intravenous Q12H   tamsulosin  0.4 mg Oral Daily

## 2021-08-19 ENCOUNTER — Inpatient Hospital Stay (HOSPITAL_COMMUNITY): Payer: Medicare PPO

## 2021-08-19 DIAGNOSIS — R601 Generalized edema: Secondary | ICD-10-CM | POA: Diagnosis not present

## 2021-08-19 DIAGNOSIS — K7460 Unspecified cirrhosis of liver: Secondary | ICD-10-CM | POA: Diagnosis not present

## 2021-08-19 DIAGNOSIS — N179 Acute kidney failure, unspecified: Secondary | ICD-10-CM | POA: Diagnosis not present

## 2021-08-19 DIAGNOSIS — N049 Nephrotic syndrome with unspecified morphologic changes: Secondary | ICD-10-CM | POA: Diagnosis not present

## 2021-08-19 DIAGNOSIS — J90 Pleural effusion, not elsewhere classified: Secondary | ICD-10-CM | POA: Diagnosis not present

## 2021-08-19 DIAGNOSIS — D472 Monoclonal gammopathy: Secondary | ICD-10-CM | POA: Diagnosis not present

## 2021-08-19 LAB — COMPREHENSIVE METABOLIC PANEL
ALT: 10 U/L (ref 0–44)
AST: 16 U/L (ref 15–41)
Albumin: 2.2 g/dL — ABNORMAL LOW (ref 3.5–5.0)
Alkaline Phosphatase: 46 U/L (ref 38–126)
Anion gap: 10 (ref 5–15)
BUN: 78 mg/dL — ABNORMAL HIGH (ref 8–23)
CO2: 22 mmol/L (ref 22–32)
Calcium: 8.2 mg/dL — ABNORMAL LOW (ref 8.9–10.3)
Chloride: 103 mmol/L (ref 98–111)
Creatinine, Ser: 3.96 mg/dL — ABNORMAL HIGH (ref 0.61–1.24)
GFR, Estimated: 15 mL/min — ABNORMAL LOW (ref >60)
Glucose, Bld: 89 mg/dL (ref 70–99)
Potassium: 4.5 mmol/L (ref 3.5–5.1)
Sodium: 135 mmol/L (ref 135–145)
Total Bilirubin: 0.8 mg/dL (ref 0.3–1.2)
Total Protein: 4.3 g/dL — ABNORMAL LOW (ref 6.5–8.1)

## 2021-08-19 LAB — CBC
HCT: 25.8 % — ABNORMAL LOW (ref 39.0–52.0)
Hemoglobin: 8.5 g/dL — ABNORMAL LOW (ref 13.0–17.0)
MCH: 29 pg (ref 26.0–34.0)
MCHC: 32.9 g/dL (ref 30.0–36.0)
MCV: 88.1 fL (ref 80.0–100.0)
Platelets: 280 10*3/uL (ref 150–400)
RBC: 2.93 MIL/uL — ABNORMAL LOW (ref 4.22–5.81)
RDW: 16 % — ABNORMAL HIGH (ref 11.5–15.5)
WBC: 6.1 10*3/uL (ref 4.0–10.5)
nRBC: 0 % (ref 0.0–0.2)

## 2021-08-19 LAB — PROTEIN / CREATININE RATIO, URINE
Creatinine, Urine: 109.39 mg/dL
Protein Creatinine Ratio: 2.25 mg/mg{Cre} — ABNORMAL HIGH (ref 0.00–0.15)
Total Protein, Urine: 246 mg/dL

## 2021-08-19 LAB — PSA: Prostatic Specific Antigen: 0.01 ng/mL (ref 0.00–4.00)

## 2021-08-19 LAB — ANA W/REFLEX IF POSITIVE: Anti Nuclear Antibody (ANA): NEGATIVE

## 2021-08-19 MED ORDER — SODIUM CHLORIDE 0.9 % IV SOLN
40.0000 mg | Freq: Once | INTRAVENOUS | Status: AC
  Administered 2021-08-19: 40 mg via INTRAVENOUS
  Filled 2021-08-19: qty 4

## 2021-08-19 MED ORDER — SODIUM CHLORIDE 0.9 % IV SOLN
510.0000 mg | Freq: Once | INTRAVENOUS | Status: AC
  Administered 2021-08-19: 510 mg via INTRAVENOUS
  Filled 2021-08-19: qty 17

## 2021-08-19 MED ORDER — METHYLPREDNISOLONE SODIUM SUCC 125 MG IJ SOLR
80.0000 mg | Freq: Once | INTRAMUSCULAR | Status: AC
  Administered 2021-08-19: 80 mg via INTRAVENOUS
  Filled 2021-08-19: qty 2

## 2021-08-19 MED ORDER — FUROSEMIDE 10 MG/ML IJ SOLN
160.0000 mg | Freq: Three times a day (TID) | INTRAVENOUS | Status: DC
  Administered 2021-08-19 – 2021-08-25 (×19): 160 mg via INTRAVENOUS
  Filled 2021-08-19: qty 16
  Filled 2021-08-19: qty 2
  Filled 2021-08-19 (×2): qty 16
  Filled 2021-08-19: qty 2
  Filled 2021-08-19 (×2): qty 16
  Filled 2021-08-19: qty 12
  Filled 2021-08-19 (×5): qty 16
  Filled 2021-08-19: qty 10
  Filled 2021-08-19 (×3): qty 16
  Filled 2021-08-19: qty 10
  Filled 2021-08-19 (×3): qty 16
  Filled 2021-08-19 (×2): qty 10
  Filled 2021-08-19 (×4): qty 16
  Filled 2021-08-19: qty 10

## 2021-08-19 MED ORDER — CEFTRIAXONE SODIUM 1 G IJ SOLR
1.0000 g | INTRAMUSCULAR | Status: AC
  Administered 2021-08-19 – 2021-08-21 (×3): 1 g via INTRAVENOUS
  Filled 2021-08-19 (×3): qty 10

## 2021-08-19 MED ORDER — METOLAZONE 5 MG PO TABS
5.0000 mg | ORAL_TABLET | Freq: Every day | ORAL | Status: DC
  Administered 2021-08-19: 5 mg via ORAL
  Filled 2021-08-19: qty 1

## 2021-08-19 NOTE — Progress Notes (Addendum)
PROGRESS NOTE    Corey Beard  VEL:[id number] DOB: Jun 18, 1946 DOA: 08/14/2021 PCP: Benito Mccreedy, MD    No chief complaint on file.   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 He is found to have bilateral pleural effusions R>L, with recent 2 L thoracentesis performed on 08/07/2021. Cr is up to 3.3 from baseline of 1.2-1.3 a year ago. He has 3+ proteinuria.  He was initially admitted by cardiology, transferred to [facility].  Nephrology consulted for evaluation of nephrotic syndrome. Further evaluation revealed cirrhosis of the liver.  GI consulted for evaluation of hepatorenal syndrome.  Assessment & Plan:   Principal Problem:   Nephrotic syndrome Active Problems:   Malignant neoplasm of prostate (HCC)   Coronary artery disease   Essential hypertension   Dyslipidemia   Anasarca with bilateral pleural effusions, proteinuria, AKI,  ascites and hypoalbuminemia: - differential include CHF vs nephrotic syndrome vs liver cirrhosis. - echocardiogram ruled out decompensated CHF.  - US renal Normal-appearing kidneys with the exception of a left lower pole renal cyst. - Renal biopsy ordered scheduled for Monday as patient has been on aspirin.,  - nephrology on board for evaluation of nephrotic syndrome.  - diuresis with IV lasix 160 mg 3 times daily along with albumin infusions, AND metolazone added to the regimen.  - protein electrophoresis show monoclonal band, UPEP is pending. increased kappa and lambda light chains 79/2640.  Concern for amyloidosis. Complements are wnl. PTH is intact. Rpr is non reactive.  - heme onc consulted for evaluation for MM. Skeletal survey is negative for lytic or sclerotic osseous lesions throughout the skeletal survey. - US liver reveals cirrhosis, will follow up with an MRI with  liver protocol. - Meanwhile GI consulted for new diagnosis of cirrhosis. US paracentesis ordered for further eval.    AKI:  ? Nephrotic syndrome vs HRS vs amyloidosis.  Creatinine at baseline around 1.2 to 1.3, admitted with a  creatinine around 3.3 <3.56 <3.77. <4 >3.9 Potassium wnl Currently on IV lasix and metolazone.     Hypertension:  Well controlled.  - on hydralazine, imdur and lasix.    Hyperlipidemia Resume lipitor and zetia.   CAD;  No chest pain  On aspirin held.    Abnormal TSH.  TSH is 7.659, free T3 is 1.3 and free t4 wnl. Recommend checking thyroid panel in 4 weeks.    BPH:  On flomax.   Abnormal UA with urine culture showing multiple species. Repeat cultures are ordered.  Nephrology started him on IV rocephin.   Bilateral upper and lower extremity edema from the anasarca with weeping  - IV albumin infusions ordered.    Ascites  US paracentesis ordered and fluid analysis sent.   Body mass index is 33.74 kg/m. Obesity:  Fluid overloaded.  Continue to monitor.    DVT prophylaxis: (Heparin) Code Status: Partial code.  Family Communication: none at bedside.  Disposition:   Status is: Inpatient  Remains inpatient appropriate because:Ongoing diagnostic testing needed not appropriate for outpatient work up, Unsafe d/c plan, and IV treatments appropriate due to intensity of illness or inability to take PO  Dispo: The patient is from: Home              Anticipated d/c is to:  pending.              Patient currently is not  medically stable to d/c.   Difficult to place patient No       Consultants:  Nephrology.  Gastroenterology  Hematology oncology.   Procedures:  US liver.  US RENAL.  ECHO. Severe concentric hypertrophy of  the left ventricle. Normal global wall motion. Normal LV systolic function  with EF 58%. Doppler evidence of grade I (impaired) diastolic dysfunction,  normal LAP. Left atrial cavity is mildly dilated.    Antimicrobials:  Antibiotics Given (last 72 hours)     Date/Time Action Medication Dose Rate   08/19/21 0948 New Bag/Given  [lasix infusing, only one iv access]   cefTRIAXone (ROCEPHIN) 1 g in sodium chloride 0.9 % 100 mL IVPB 1 g 200 mL/hr         Subjective: Weeping from the upper extremities improved.   Objective: Vitals:   08/18/21 1500 08/18/21 1956 08/19/21 0441 08/19/21 1051  BP: (!) 120/54 125/66 (!) 114/49 123/66  Pulse: 73 73 71 78  Resp: _0 Temp: 98.3 F (36.8 C) (!) 97.4 F (36.3 C) 97.8 F (36.6 C) 97.7 F (36.5 C)  TempSrc: Oral Oral Oral Oral  SpO2: 92% 92% 96% 91%  Weight:   116 kg   Height:        Intake/Output Summary (Last 24 hours) at 08/19/2021 1606 Last data filed at 08/19/2021 1336 Gross per 24 hour  Intake 1325.65 ml  Output 1530 ml  Net -204.35 ml    Filed Weights   08/17/21 0459 08/18/21 0451 08/19/21 0441  Weight: 117.5 kg 117.1 kg 116 kg    Examination: General exam: Ill-appearing elderly gentleman not in any kind of distress Respiratory system: Diminished air entry at bases, no wheezing or rhonchi Cardiovascular system: S1 & S2 heard, RRR.  JVD cannot be appreciated edematous upper extremities and lower extremities Gastrointestinal system: Abdomen is soft, distended nontender bowel sounds normal Central nervous system: Alert and oriented. No focal neurological deficits. Extremities: 3+ leg edema Skin: Weeping clear fluid from erythematous upper extremities Psychiatry: Mood is appropriate     Data Reviewed: I have personally reviewed following labs and imaging studies  CBC: Recent Labs  Lab 08/14/21 2118 08/16/21 0404 08/18/21 0301 08/19/21 0239  WBC 8.2  --  6.5 6.1  HGB 11.0* 8.9* 9.2* 8.5*  HCT 33.2*  --  28.2* 25.8*  MCV 87.4  --  87.0 88.1  PLT 291  --  286 280     Basic Metabolic Panel: Recent Labs  Lab 08/15/21 0243 08/16/21 0404 08/17/21 0221 08/18/21 0301 08/19/21 0239  NA 135 136 135  135 135  K 4.7 4.7 5.0 4.8 4.5  CL 105 106 105 104 103  CO2 23 22 21* 22 22  GLUCOSE 88 82 92 89 89  BUN 61* 65* 68* 74* 78*  CREATININE 3.17* 3.56* 3.77* 4.06* 3.96*  CALCIUM 7.6* 7.9*  7.9* 8.1* 8.3* 8.2*  PHOS  --  4.9*  --   --   --      GFR: Estimated Creatinine Clearance: 22.2 mL/min (A) (by C-G formula based on SCr of 3.96 mg/dL (H)).  Liver Function Tests: Recent Labs  Lab 08/14/21 2118 08/16/21 0404 08/17/21 0221 08/18/21 0301 08/19/21 0239  AST 22  --  _1 ALT 13  --  _2 ALKPHOS 60  --  63 48 46  BILITOT 0.4  --  0.5 0.6 0.8  PROT 4.3*  --  4.4* 4.5* 4.3*  ALBUMIN 1.7* 1.5*  1.6* 2.2*  2.2* 2.2*     CBG: Recent Labs  Lab 08/15/21 2142 08/16/21 0550  GLUCAP 332* 85      Recent Results (from the past 240 hour(s))  Resp Panel by RT-PCR (Flu A&B, Covid) Nasopharyngeal Swab     Status: None   Collection Time: 08/14/21  7:57 PM   Specimen: Nasopharyngeal Swab; Nasopharyngeal(NP) swabs in vial transport medium  Result Value Ref Range Status   SARS Coronavirus 2 by RT PCR NEGATIVE NEGATIVE Final    Comment: (NOTE) SARS-CoV-2 target nucleic acids are NOT DETECTED.  The SARS-CoV-2 RNA is generally detectable in upper respiratory specimens during the acute phase of infection. The lowest concentration of SARS-CoV-2 viral copies this assay can detect is 138 copies/mL. A negative result does not preclude SARS-Cov-2 infection and should not be used as the sole basis for treatment or other patient management decisions. A negative result may occur with  improper specimen collection/handling, submission of specimen other than nasopharyngeal swab, presence of viral mutation(s) within the areas targeted by this assay, and inadequate number of viral copies(<138 copies/mL). A negative result must be combined with clinical observations, patient history, and epidemiological information. The expected result is Negative.  Fact Sheet for Patients:   EntrepreneurPulse.com.au  Fact Sheet for Healthcare Providers:  IncredibleEmployment.be  This test is no t yet approved or cleared by the [place] FDA and  has been authorized for detection and/or diagnosis of SARS-CoV-2 by FDA under an Emergency Use Authorization (EUA). This EUA will remain  in effect (meaning this test can be used) for the duration of the COVID-19 declaration under Section 564(b)(1) of the Act, 21 U.S.C.section 360bbb-3(b)(1), unless the authorization is terminated  or revoked sooner.       Influenza A by PCR NEGATIVE NEGATIVE Final   Influenza B by PCR NEGATIVE NEGATIVE Final    Comment: (NOTE) The Xpert Xpress SARS-CoV-2/FLU/RSV plus assay is intended as an aid in the diagnosis of influenza from Nasopharyngeal swab specimens and should not be used as a sole basis for treatment. Nasal washings and aspirates are unacceptable for Xpert Xpress SARS-CoV-2/FLU/RSV testing.  Fact Sheet for Patients: EntrepreneurPulse.com.au  Fact Sheet for Healthcare Providers: IncredibleEmployment.be  This test is not yet approved or cleared by the [place] FDA and has been authorized for detection and/or diagnosis of SARS-CoV-2 by FDA under an Emergency Use Authorization (EUA). This EUA will remain in effect (meaning this test can be used) for the duration of the COVID-19 declaration under Section 564(b)(1) of the Act, 21 U.S.C. section 360bbb-3(b)(1), unless the authorization is terminated or revoked.  Performed at [facility], [place] [address] [place], [place] [address]   Urine Culture     Status: Abnormal   Collection Time: 08/15/21  7:56 AM   Specimen: Urine, Clean Catch  Result Value Ref Range Status   Specimen Description URINE, CLEAN CATCH  Final   Special Requests   Final    Normal Performed at [facility], [place] [address] [place], [place] [address]    Culture  MULTIPLE SPECIES PRESENT, SUGGEST RECOLLECTION (A)  Final   Report Status 08/16/2021 FINAL  Final  Urine Culture     Status: Abnormal   Collection Time: 08/17/21  5:24 AM   Specimen: Urine, Clean Catch  Result Value Ref Range Status   Specimen Description URINE, CLEAN CATCH  Final   Special Requests   Final    NONE Performed at [facility], [place]  [address] [place], [place] [address]    Culture MULTIPLE SPECIES PRESENT, SUGGEST RECOLLECTION (A)  Final   Report Status 08/18/2021 FINAL  Final  Gram stain     Status: None   Collection Time: 08/18/21  9:27 AM   Specimen: Abdomen; Peritoneal Fluid  Result Value Ref Range Status   Specimen Description PERITONEAL FLUID  Final   Special Requests NONE  Final   Gram Stain   Final    WBC PRESENT, PREDOMINANTLY MONONUCLEAR NO ORGANISMS SEEN CYTOSPIN SMEAR Performed at [facility], [place] [address], [place], [place] [address]    Report Status 08/18/2021 FINAL  Final  Culture, body fluid w Gram Stain-bottle     Status: None (Preliminary result)   Collection Time: 08/18/21  9:27 AM   Specimen: Peritoneal Washings  Result Value Ref Range Status   Specimen Description PERITONEAL FLUID  Final   Special Requests NONE  Final   Culture   Final    NO GROWTH 1 DAY Performed at [place] [address], [place], [place] [address]    Report Status PENDING  Incomplete          Radiology Studies: MR LIVER WO CONRTAST  Result Date: 08/18/2021 CLINICAL DATA:  Evaluate for cirrhosis. EXAM: MRI ABDOMEN WITHOUT CONTRAST TECHNIQUE: Multiplanar multisequence MR imaging was performed without the administration of intravenous contrast. COMPARISON:  08/16/2021 FINDINGS: Exam detail is markedly diminished due to a number of factors most importantly however is diffuse motion artifact. Lower chest: There are moderate bilateral pleural effusions with atelectasis within both lower lobes, the right middle lobe and lingula. Hepatobiliary:  Imaging of the liver is significantly degraded by motion artifact as well as diffuse body wall edema and ascites. The study is not diagnostic for the evaluation of underlying liver lesion. Furthermore assessment of intrinsic liver abnormality cannot be adequately assessed. The gallbladder is visualized. No stones noted Pancreas:  Study not diagnostic for the evaluation of the pancreas. Spleen:  Appears normal in size. Adrenals/Urinary Tract: Suboptimally evaluated. A cyst is noted arising off the inferior pole of the left kidney measuring 2.6 cm. Stomach/Bowel: Diffuse gaseous distension of the colon is noted. Vascular/Lymphatic: Study not diagnostic for the assessment of vascular structures or the presence or absence of adenopathy. Other: Abdominal ascites identified. Volume of ascites is difficult to quantify due to factors discussed above. Musculoskeletal: No acute abnormality noted. IMPRESSION: 1. Markedly diminished exam detail due to a number of factors most importantly however, there is diffuse motion artifact. This is most likely secondary to shortness of breath due to bilateral pleural effusions and overall fluid overload state. As mentioned on 08/16/2021, MRI should only be performed when the patient is clinically stable, able to follow directions, and hold their breath. If cross-sectional imaging of the liver is currently indicated on an emergent basis then liver protocol CT without and with contrast material may be more appropriate as this is less susceptible to respiratory motion artifact. 2. Moderate bilateral pleural effusions, ascites, and diffuse anasarca. 3. Diffuse gaseous distension of the colon. Electronically Signed   By: Kerby Moors M.D.   On: 08/18/2021 06:00   DG Bone Survey Met  Result Date: 08/19/2021 CLINICAL DATA:  Metastatic bone survey. EXAM: METASTATIC BONE SURVEY COMPARISON:  MRI of the abdomen August 17, 2021 FINDINGS: Skull: Normal bony mineralization. No focal lytic or  blastic osseous lesion. Cervical Spine: Normal bony mineralization. No focal lytic or blastic osseous lesion. Thoracic Spine: Normal bony mineralization. No focal  lytic or blastic osseous lesion. Chest: Normal bony mineralization. No focal lytic or blastic osseous lesion. Lumbar Spine: Normal bony mineralization. No focal lytic or blastic osseous lesion. Pelvis: Normal bony mineralization. No focal lytic or blastic osseous lesion. Right Upper Extremity: Normal bony mineralization. No focal lytic or blastic osseous lesion. Left Upper Extremity: Normal bony mineralization. No focal lytic or blastic osseous lesion. Right Lower Extremity: Normal bony mineralization. No focal lytic or blastic osseous lesion. Left Lower Extremity: Normal bony mineralization. No focal lytic or blastic osseous lesion. Additional Bilateral pleural effusions. Osteoarthritic changes of the bilateral shoulders. Calcific atherosclerotic disease of the carotid arteries, aorta and femoral arteries. IMPRESSION: No lytic or sclerotic osseous lesions seen throughout the skeletal survey. Electronically Signed   By: Fidela Salisbury M.D.   On: 08/19/2021 14:07   IR Paracentesis  Result Date: 08/18/2021 INDICATION: Patient with history of AKI, newly diagnosed cirrhosis, and anasarca. Request for diagnostic only paracentesis with a maximum drain of 100 mL. EXAM: ULTRASOUND GUIDED DIAGNOSTIC PARACENTESIS MEDICATIONS: 10 mL 1% lidocaine COMPLICATIONS: None immediate. PROCEDURE: Informed written consent was obtained from the patient after a discussion of the risks, benefits and alternatives to treatment. A timeout was performed prior to the initiation of the procedure. Initial ultrasound scanning demonstrates a moderate amount of ascites within the right upper abdominal quadrant. The right upper abdomen was prepped and draped in the usual sterile fashion. 1% lidocaine was used for local anesthesia. Following this, a 6 Fr Safe-T-Centesis catheter was  introduced. An ultrasound image was saved for documentation purposes. The paracentesis was performed. The catheter was removed and a dressing was applied. The patient tolerated the procedure well without immediate post procedural complication. FINDINGS: A total of approximately 100 mL of hazy, yellow fluid was removed. Samples were sent to the laboratory as requested by the clinical team. Per order maximum of 100 mL was removed from right upper quadrant even though there was a moderate amount of ascites noted on ultrasound images remaining. IMPRESSION: Successful ultrasound-guided paracentesis yielding 100 mL of peritoneal fluid. Read by: Narda Rutherford, NP Electronically Signed   By: Miachel Roux M.D.   On: 08/18/2021 11:11        Scheduled Meds:  atorvastatin  40 mg Oral Daily   ezetimibe  10 mg Oral QPC supper   heparin  5,000 Units Subcutaneous Q8H   hydrALAZINE  25 mg Oral Q8H   isosorbide mononitrate  60 mg Oral Daily   labetalol  100 mg Oral BID   metolazone  5 mg Oral Daily   sodium chloride flush  3 mL Intravenous Q12H   tamsulosin  0.4 mg Oral Daily   Continuous Infusions:  sodium chloride 250 mL (08/19/21 0642)   sodium chloride 10 mL/hr at 08/18/21 2046   albumin human 25 g (08/19/21 1519)   cefTRIAXone (ROCEPHIN)  IV 1 g (08/19/21 0948)   furosemide       LOS: 5 days        Hosie Poisson, MD [facility]   To contact the attending provider between 7A-7P or the covering provider during after hours 7P-7A, please log into the web site [email or web address] and access using universal [facility] password for that web site. If you do not have the password, please call the hospital operator.  08/19/2021, 4:06 PM

## 2021-08-19 NOTE — Progress Notes (Signed)
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 PMH: HTN, DM2, prostate ca, tobacco use, carotid stenosis, non obstructive CAD, glaucoma, arthritis and MI.    PT Comments    Pt received in supine, agreeable to limited bed-level therapy session, refusing OOB or repositioning in bed for pressure relief due to fatigue. Per RN, pt got up to chair earlier in day. Pt instructed on positioning for edema reduction and pressure relief and supine UE/LE home exercise program as well as importance of continued mobility. Supine HEP given and instructions on Incentive Spirometry due to pleural effusions, no IS found in room and RN notified he would benefit from one for pulmonary clearance/endurance building. Pt continues to benefit from PT services to progress toward functional mobility goals. Continue to recommend HHPT, pending progress acutely.   Recommendations for follow up therapy are one component of a multi-disciplinary discharge planning process, led by the attending physician.  Recommendations may be updated based on patient status, additional functional criteria and insurance authorization.  Follow Up Recommendations  Home health PT;Supervision for mobility/OOB     Equipment Recommendations  None recommended by PT;Other (comment) (may need to consider ramp installation for stairs)    Recommendations for Other Services       Precautions / Restrictions Precautions Precautions: Fall Restrictions Weight Bearing Restrictions: No     Mobility  Bed Mobility Overal bed mobility: Needs Assistance             General bed mobility comments: pt refused rolling or supine scoot toward HOB, verbally reviewed pressure relief technique/frequency with him     Transfers                 General transfer comment: per RN he got up to chair earlier in the day; pt declines OOB mobility, citing fatigue  Ambulation/Gait                 Stairs             Wheelchair Mobility    Modified Rankin (Stroke Patients Only)       Balance Overall balance assessment: Needs assistance     Sitting balance - Comments: UTA; pt refused                                    Cognition Arousal/Alertness: Awake/alert Behavior During Therapy: WFL for tasks assessed/performed;Flat affect Overall Cognitive Status: Within Functional Limits for tasks assessed                                 General Comments: patient oriented to location/self/reason for admission, very flat and seems frustrated with therapist encouragement and instruction. limited session per his wishes.      Exercises Other Exercises Other Exercises: supine BLE AROM: ankle pumps/circles, quad sets, glute sets x10 reps ea; pt refusing hip adduction or hip IR as this is too uncomfortable Other Exercises: supine BUE AROM: finger grasp/extension, wrist flex/ext elbow flex/ext (within pain-free range) x10 reps ea -heavily encouraged for edema reduction/encouraged ease of movement in UE Other Exercises: pt refused seated/standing activity    General Comments General comments (skin integrity, edema, etc.):  noted significant BUE pitting edema, pt agreeable to retrograde massage in B hands/forearms and BUE repositioning for edema relief; encouraged him to float heels on pillows but pt refusing further repositioning      Pertinent Vitals/Pain Pain Assessment: Faces Faces Pain Scale: Hurts even more Pain Location: generalized Pain Descriptors / Indicators: Grimacing;Discomfort Pain Intervention(s): Limited activity within patient's tolerance;Other (comment) (pt refused repositioning despite encouragement; agreeable only for therapist to help him  elevate BUE)    Home Living                      Prior Function            PT Goals (current goals can now be found in the care plan section) Acute Rehab PT Goals Patient Stated Goal: return home PT Goal Formulation: With patient/family Time For Goal Achievement: 08/30/21 Potential to Achieve Goals: Fair Progress towards PT goals: Not progressing toward goals - comment (refusing OOB for PT past 2 days, has mobilized a little in room per RN)    Frequency    Min 3X/week      PT Plan Current plan remains appropriate    Co-evaluation              AM-PAC PT "6 Clicks" Mobility   Outcome Measure  Help needed turning from your back to your side while in a flat bed without using bedrails?: A Little Help needed moving from lying on your back to sitting on the side of a flat bed without using bedrails?: A Little Help needed moving to and from a bed to a chair (including a wheelchair)?: A Little Help needed standing up from a chair using your arms (e.g., wheelchair or bedside chair)?: A Little Help needed to walk in hospital room?: A Little Help needed climbing 3-5 steps with a railing? : Total 6 Click Score: 16    End of Session   Activity Tolerance: Patient limited by fatigue;Patient limited by pain Patient left: in bed;with call bell/phone within reach;with bed alarm set;Other (comment) (BUE (hands) elevated on pillows) Nurse Communication: Mobility status;Other (comment) (pt refusing OOB, would benefit from incentive spirometer (didn't see one in his room)) PT Visit Diagnosis: Other abnormalities of gait and mobility (R26.89);Muscle weakness (generalized) (M62.81);Difficulty in walking, not elsewhere classified (R26.2)     Time: 6967-8938 PT Time Calculation (min) (ACUTE ONLY): 12 min  Charges:  $Therapeutic Exercise: 8-22 mins                     Carrin Vannostrand P., PTA Acute Rehabilitation Services Pager: [phone] Office: [place] 08/19/2021, 5:47 PM

## 2021-08-19 NOTE — Progress Notes (Signed)
Overall, there really is nothing new to report.  His hemoglobin is dropping.  Hemoglobin 8.5.  He has a very low reticulocyte count.  I think that the corrected reticulocyte count is less than 1%.  Will send off an erythropoietin level.  I am sure this is can be quite low.  The 24-hour urine will be critical.  Results not back yet.  A bone survey might help [place] out.  He did have a chest x-ray when he came in which looked unremarkable for any obvious lytic lesions in the thoracic area.  Again, the 1 diagnoses I can think of that can tie all of this together will be amyloidosis.  The kidney biopsy can certainly shows this.  The bone marrow biopsy can also shows this.  His renal function is still quite low.  His BUN is 78 creatinine 3.96.  His albumin is 2.2.  He does have some iron deficiency.  He may benefit from some IV iron.  We can give him a dose and see how he does with that.  Again, we still are missing a couple "pieces of the puzzle."  I think that next week we will easily know what is going on whether or not there is a hematologic malignancy.  Lattie Haw, MD  Psalms 91:1-2

## 2021-08-19 NOTE — Progress Notes (Signed)
[facility] Progress Note  Assessment/Plan **AKI, worsening:  Cr 1.3 earlier in the year and now trending up from 3s to 4s in the past few week.  Clinical picture initially most concerning for nephrotic syndrome with AKI though UP/C showing non nephrotic proteinuria with UP/C 1.65, lipids ok. Renal US normal size and echogenicity.  Renal vein dopplers ok.  Pending numerous serologies, and plans for IR US guided renal biopsy after ASA off x 3 days - likely Monday.  Serologic eval has been unremarkable to date but now see K/L FLC abnormal - elevated lambda chains, SPEP with faint monoclonal band; UPEP pending.  Concern for amyloid or MGRS mainly.  Hematology following now too. Will continue to hold ASA in prep for renal biopsy Mon.  Hold ARB, avoid nephrotoxins.     **Proteinuria:  3+ on UA, UP/C 1.65  Holding ARB in setting of AKI for now.  W/u per above with elevated lambda light chains. Skeletal survey today.   **Cirrhosis:  new dx.  No h/o liver dz.  GI consulting and eval etiology.  ?EtOH +/- NASH.  No significant portal hypertension so unlikely this is driving the ansarca.    **Anasarca:  Still suboptimal diuresis.  low na diet.  increase lasix 160q8 and albumin IV prior to doses and increase metolazone 5 daily given suboptimal diuresis. He ultimately may end up needing RRT/ultrafiltration if he fails medical management - he's ok with this if it's needed. Strict I/Os, daily weights.  Eval underlying issue per above.     **DM:  per primary.     **UTI:  asymptomatic currently but UA suggestive of infection - culture initially multiple species, culture resent and showing the same.  Will treat with ceftriaxone empirically.   Does have h/o prostatitis following prostate ca therapy but is currently asymptomatic from that.    Will follow closely, call with concerns.  ________________________________________________________________________________ Subjective:   UOP increased to 1.1L but  only net neg 0.470L.  No new complaints today.   Objective Vitals:   08/18/21 1137 08/18/21 1500 08/18/21 1956 08/19/21 0441  BP: (!) 98/50 (!) 120/54 125/66 (!) 114/49  Pulse: 75 73 73 71  Resp: 18 18 20 20   Temp: 97.6 F (36.4 C) 98.3 F (36.8 C) (!) 97.4 F (36.3 C) 97.8 F (36.6 C)  TempSrc: Oral Oral Oral Oral  SpO2: 96% 92% 92% 96%  Weight:    116 kg  Height:       Physical Exam Gen: comfortable in bed  Eyes: anicteric, EOMI, +glasses ENT: MMM, no oral ulcers Neck: supple CV:  RRR, no rub Abd: soft, +abd wall edema, mod distended, nontender Lungs: normal WOB sitting at rest, diminished BL 1/3 lung fields GU: no foley - has condom cath with skin breakdown around SP edema; scrotal edema Extr:  3+ pitting edema throughout Neuro: nonfocal Skin: no rashes, has some denuded skin L AC fossa and some small blisters noted L forearm, small blisters R AC fossa; Legs without blistering  Additional Objective Labs: Basic Metabolic Panel: Recent Labs  Lab 08/16/21 0404 08/17/21 0221 08/18/21 0301 08/19/21 0239  NA 136 135 135 135  K 4.7 5.0 4.8 4.5  CL 106 105 104 103  CO2 22 21* 22 22  GLUCOSE 82 92 89 89  BUN 65* 68* 74* 78*  CREATININE 3.56* 3.77* 4.06* 3.96*  CALCIUM 7.9*  7.9* 8.1* 8.3* 8.2*  PHOS 4.9*  --   --   --     Liver  Function Tests: Recent Labs  Lab 08/17/21 0221 08/18/21 0301 08/19/21 0239  AST 19 15 16   ALT 12 11 10   ALKPHOS 63 48 46  BILITOT 0.5 0.6 0.8  PROT 4.4* 4.5* 4.3*  ALBUMIN 1.6* 2.2*  2.2* 2.2*    No results for input(s): LIPASE, AMYLASE in the last 168 hours. CBC: Recent Labs  Lab 08/14/21 2118 08/16/21 0404 08/18/21 0301 08/19/21 0239  WBC 8.2  --  6.5 6.1  HGB 11.0* 8.9* 9.2* 8.5*  HCT 33.2*  --  28.2* 25.8*  MCV 87.4  --  87.0 88.1  PLT 291  --  286 280    Blood Culture    Component Value Date/Time   SDES PERITONEAL FLUID 08/18/2021 0927   SPECREQUEST NONE 08/18/2021 0927   CULT MULTIPLE SPECIES PRESENT,  SUGGEST RECOLLECTION (A) 08/17/2021 0524   REPTSTATUS 08/18/2021 FINAL 08/18/2021 0927    Cardiac Enzymes: No results for input(s): CKTOTAL, CKMB, CKMBINDEX, TROPONINI in the last 168 hours. CBG: Recent Labs  Lab 08/15/21 2142 08/16/21 0550  GLUCAP 332* 85    Iron Studies:  Recent Labs    08/18/21 0301  FERRITIN 73    @[email or web address] @ Studies/Results: MR LIVER WO CONRTAST  Result Date: 08/18/2021 CLINICAL DATA:  Evaluate for cirrhosis. EXAM: MRI ABDOMEN WITHOUT CONTRAST TECHNIQUE: Multiplanar multisequence MR imaging was performed without the administration of intravenous contrast. COMPARISON:  08/16/2021 FINDINGS: Exam detail is markedly diminished due to a number of factors most importantly however is diffuse motion artifact. Lower chest: There are moderate bilateral pleural effusions with atelectasis within both lower lobes, the right middle lobe and lingula. Hepatobiliary: Imaging of the liver is significantly degraded by motion artifact as well as diffuse body wall edema and ascites. The study is not diagnostic for the evaluation of underlying liver lesion. Furthermore assessment of intrinsic liver abnormality cannot be adequately assessed. The gallbladder is visualized. No stones noted Pancreas:  Study not diagnostic for the evaluation of the pancreas. Spleen:  Appears normal in size. Adrenals/Urinary Tract: Suboptimally evaluated. A cyst is noted arising off the inferior pole of the left kidney measuring 2.6 cm. Stomach/Bowel: Diffuse gaseous distension of the colon is noted. Vascular/Lymphatic: Study not diagnostic for the assessment of vascular structures or the presence or absence of adenopathy. Other: Abdominal ascites identified. Volume of ascites is difficult to quantify due to factors discussed above. Musculoskeletal: No acute abnormality noted. IMPRESSION: 1. Markedly diminished exam detail due to a number of factors most importantly however, there is diffuse motion artifact.  This is most likely secondary to shortness of breath due to bilateral pleural effusions and overall fluid overload state. As mentioned on 08/16/2021, MRI should only be performed when the patient is clinically stable, able to follow directions, and hold their breath. If cross-sectional imaging of the liver is currently indicated on an emergent basis then liver protocol CT without and with contrast material may be more appropriate as this is less susceptible to respiratory motion artifact. 2. Moderate bilateral pleural effusions, ascites, and diffuse anasarca. 3. Diffuse gaseous distension of the colon. Electronically Signed   By: Kerby Moors M.D.   On: 08/18/2021 06:00   IR Paracentesis  Result Date: 08/18/2021 INDICATION: Patient with history of AKI, newly diagnosed cirrhosis, and anasarca. Request for diagnostic only paracentesis with a maximum drain of 100 mL. EXAM: ULTRASOUND GUIDED DIAGNOSTIC PARACENTESIS MEDICATIONS: 10 mL 1% lidocaine COMPLICATIONS: None immediate. PROCEDURE: Informed written consent was obtained from the patient after a discussion of the risks, benefits and  alternatives to treatment. A timeout was performed prior to the initiation of the procedure. Initial ultrasound scanning demonstrates a moderate amount of ascites within the right upper abdominal quadrant. The right upper abdomen was prepped and draped in the usual sterile fashion. 1% lidocaine was used for local anesthesia. Following this, a 6 Fr Safe-T-Centesis catheter was introduced. An ultrasound image was saved for documentation purposes. The paracentesis was performed. The catheter was removed and a dressing was applied. The patient tolerated the procedure well without immediate post procedural complication. FINDINGS: A total of approximately 100 mL of hazy, yellow fluid was removed. Samples were sent to the laboratory as requested by the clinical team. Per order maximum of 100 mL was removed from right upper quadrant even  though there was a moderate amount of ascites noted on ultrasound images remaining. IMPRESSION: Successful ultrasound-guided paracentesis yielding 100 mL of peritoneal fluid. Read by: Narda Rutherford, NP Electronically Signed   By: Miachel Roux M.D.   On: 08/18/2021 11:11   Medications:  sodium chloride 250 mL (08/19/21 0642)   sodium chloride 10 mL/hr at 08/18/21 2046   albumin human 25 g (08/19/21 0509)   famotidine (PEPCID) IV     ferumoxytol     furosemide 120 mg (08/19/21 0723)    atorvastatin  40 mg Oral Daily   ezetimibe  10 mg Oral QPC supper   heparin  5,000 Units Subcutaneous Q8H   hydrALAZINE  25 mg Oral Q8H   isosorbide mononitrate  60 mg Oral Daily   labetalol  100 mg Oral BID   methylPREDNISolone (SOLU-MEDROL) injection  80 mg Intravenous Once   metolazone  2.5 mg Oral Daily   sodium chloride flush  3 mL Intravenous Q12H   tamsulosin  0.4 mg Oral Daily

## 2021-08-20 DIAGNOSIS — N179 Acute kidney failure, unspecified: Secondary | ICD-10-CM | POA: Diagnosis not present

## 2021-08-20 DIAGNOSIS — N049 Nephrotic syndrome with unspecified morphologic changes: Secondary | ICD-10-CM | POA: Diagnosis not present

## 2021-08-20 DIAGNOSIS — D472 Monoclonal gammopathy: Secondary | ICD-10-CM | POA: Diagnosis not present

## 2021-08-20 DIAGNOSIS — K7460 Unspecified cirrhosis of liver: Secondary | ICD-10-CM | POA: Diagnosis not present

## 2021-08-20 LAB — RENAL FUNCTION PANEL
Albumin: 2.5 g/dL — ABNORMAL LOW (ref 3.5–5.0)
Anion gap: 11 (ref 5–15)
BUN: 85 mg/dL — ABNORMAL HIGH (ref 8–23)
CO2: 21 mmol/L — ABNORMAL LOW (ref 22–32)
Calcium: 8.4 mg/dL — ABNORMAL LOW (ref 8.9–10.3)
Chloride: 104 mmol/L (ref 98–111)
Creatinine, Ser: 4.28 mg/dL — ABNORMAL HIGH (ref 0.61–1.24)
GFR, Estimated: 14 mL/min — ABNORMAL LOW (ref >60)
Glucose, Bld: 106 mg/dL — ABNORMAL HIGH (ref 70–99)
Phosphorus: 7 mg/dL — ABNORMAL HIGH (ref 2.5–4.6)
Potassium: 5.2 mmol/L — ABNORMAL HIGH (ref 3.5–5.1)
Sodium: 136 mmol/L (ref 135–145)

## 2021-08-20 LAB — CBC
HCT: 30.4 % — ABNORMAL LOW (ref 39.0–52.0)
Hemoglobin: 10 g/dL — ABNORMAL LOW (ref 13.0–17.0)
MCH: 28.6 pg (ref 26.0–34.0)
MCHC: 32.9 g/dL (ref 30.0–36.0)
MCV: 86.9 fL (ref 80.0–100.0)
Platelets: 334 10*3/uL (ref 150–400)
RBC: 3.5 MIL/uL — ABNORMAL LOW (ref 4.22–5.81)
RDW: 15.5 % (ref 11.5–15.5)
WBC: 5.6 10*3/uL (ref 4.0–10.5)
nRBC: 0 % (ref 0.0–0.2)

## 2021-08-20 LAB — QUANTIFERON-TB GOLD PLUS (RQFGPL)
QuantiFERON Mitogen Value: 2.96 IU/mL
QuantiFERON Nil Value: 0.18 IU/mL
QuantiFERON TB1 Ag Value: 1.16 IU/mL
QuantiFERON TB2 Ag Value: 1.02 IU/mL

## 2021-08-20 LAB — ERYTHROPOIETIN: Erythropoietin: 14.1 m[IU]/mL (ref 2.6–18.5)

## 2021-08-20 LAB — QUANTIFERON-TB GOLD PLUS: QuantiFERON-TB Gold Plus: POSITIVE — AB

## 2021-08-20 LAB — POTASSIUM: Potassium: 5 mmol/L (ref 3.5–5.1)

## 2021-08-20 MED ORDER — METOLAZONE 5 MG PO TABS
10.0000 mg | ORAL_TABLET | Freq: Every day | ORAL | Status: DC
  Administered 2021-08-20 – 2021-08-24 (×5): 10 mg via ORAL
  Filled 2021-08-20 (×5): qty 2

## 2021-08-20 NOTE — Progress Notes (Addendum)
[facility] Progress Note  Assessment/Plan **AKI, worsening:  Cr 1.3 earlier in the year and now trending up from 3s to 4s in the past few week.  Clinical picture initially most concerning for nephrotic syndrome with AKI though UP/C showing non nephrotic proteinuria with UP/C 1.65 and on repeat 2.3, lipids ok. Renal US normal size and echogenicity.  Renal vein dopplers ok.  Serologic eval has shown K/L FLC abnormal - elevated lambda chains, SPEP with faint monoclonal band; UPEP pending; skeletal survey is unrevealing.  Concern for amyloid or MGRS mainly.  Hematology following now too. Will continue to hold ASA in prep for renal biopsy Mon.  Hold ARB, avoid nephrotoxins.     **Proteinuria:  3+ on UA, UP/C 1.65 repeated 2.3. Holding ARB in setting of AKI for now.  W/u per above with elevated lambda light chains.   **Anasarca:  Still suboptimal diuresis and really maxed on medical management --> discussed with him today rec proceed to RRT for UF.  C/S IR for [facility].  D/C albumin infusions given lack of efficacy and additional fluid load; cont IV lasix and metolazone today.  Strict I/Os, daily weights.  Eval underlying issue per above.    **Cirrhosis:  new dx.  No h/o liver dz.  GI consulting and eval etiology.  ?EtOH +/- NASH.  No significant portal hypertension so unlikely this is driving the ansarca.    **DM:  per primary.     **UTI:  asymptomatic currently but UA suggestive of infection - culture initially multiple species, culture resent and showing the same.  Will treat with ceftriaxone empirically.   Does have h/o prostatitis following prostate ca therapy but is currently asymptomatic from that.   **h/o prostate ca: tx with local bead implants - PSA < 1.   **Anemia:  rec'd IV Iron yesterday; hematology has sent epo level.  Hb 10 today.    Will follow closely, call with concerns.  ________________________________________________________________________________ Subjective:   UOP  only 0.63L but only net neg 0.9L for admission - did miss 1 dose yesterday for unclear reasons.  No new complaints today.  No uremic symptoms.   Objective Vitals:   08/19/21 0441 08/19/21 1051 08/19/21 1943 08/20/21 0439  BP: (!) 114/49 123/66 138/66 126/66  Pulse: 71 78 75 64  Resp: _0 Temp: 97.8 F (36.6 C) 97.7 F (36.5 C) 97.8 F (36.6 C) 97.6 F (36.4 C)  TempSrc: Oral Oral Oral Oral  SpO2: 96% 91% 97% 92%  Weight: 116 kg     Height:       Physical Exam Gen: comfortable in bed  Eyes: anicteric, EOMI, +glasses ENT: MMM, no oral ulcers Neck: supple CV:  RRR, no rub Abd: soft, +abd wall edema, mod distended, nontender Lungs: normal WOB sitting at rest, diminished BL 1/3 lung fields GU: no foley - has condom cath with skin breakdown around SP edema; scrotal edema Extr:  3+ pitting edema throughout Neuro: nonfocal Skin: no rashes, has some denuded skin L AC fossa and some small blisters noted L forearm, small blisters R AC fossa; Legs without blistering  Additional Objective Labs: Basic Metabolic Panel: Recent Labs  Lab 08/16/21 0404 08/17/21 0221 08/18/21 0301 08/19/21 0239  NA 136 135 135 135  K 4.7 5.0 4.8 4.5  CL 106 105 104 103  CO2 22 21* 22 22  GLUCOSE 82 92 89 89  BUN 65* 68* 74* 78*  CREATININE 3.56* 3.77* 4.06* 3.96*  CALCIUM 7.9*  7.9*  8.1* 8.3* 8.2*  PHOS 4.9*  --   --   --     Liver Function Tests: Recent Labs  Lab 08/17/21 0221 08/18/21 0301 08/19/21 0239  AST _0 ALT _1 ALKPHOS 63 48 46  BILITOT 0.5 0.6 0.8  PROT 4.4* 4.5* 4.3*  ALBUMIN 1.6* 2.2*  2.2* 2.2*    No results for input(s): LIPASE, AMYLASE in the last 168 hours. CBC: Recent Labs  Lab 08/14/21 2118 08/16/21 0404 08/18/21 0301 08/19/21 0239 08/20/21 0200  WBC 8.2  --  6.5 6.1 5.6  HGB 11.0*   < > 9.2* 8.5* 10.0*  HCT 33.2*  --  28.2* 25.8* 30.4*  MCV 87.4  --  87.0 88.1 86.9  PLT 291  --  286 280 334   < > = values in this interval not  displayed.    Blood Culture    Component Value Date/Time   SDES PERITONEAL FLUID 08/18/2021 0927   SDES PERITONEAL FLUID 08/18/2021 0927   SPECREQUEST NONE 08/18/2021 0927   SPECREQUEST NONE 08/18/2021 0927   CULT  08/18/2021 0927    NO GROWTH 1 DAY Performed at [facility], [place] [address] [place], [place] [address]    REPTSTATUS 08/18/2021 FINAL 08/18/2021 0927   REPTSTATUS PENDING 08/18/2021 6301    Cardiac Enzymes: No results for input(s): CKTOTAL, CKMB, CKMBINDEX, TROPONINI in the last 168 hours. CBG: Recent Labs  Lab 08/15/21 2142 08/16/21 0550  GLUCAP 332* 85    Iron Studies:  Recent Labs    08/18/21 0301  FERRITIN 73    _2 @ Studies/Results: DG Bone Survey Met  Result Date: 08/19/2021 CLINICAL DATA:  Metastatic bone survey. EXAM: METASTATIC BONE SURVEY COMPARISON:  MRI of the abdomen August 17, 2021 FINDINGS: Skull: Normal bony mineralization. No focal lytic or blastic osseous lesion. Cervical Spine: Normal bony mineralization. No focal lytic or blastic osseous lesion. Thoracic Spine: Normal bony mineralization. No focal lytic or blastic osseous lesion. Chest: Normal bony mineralization. No focal lytic or blastic osseous lesion. Lumbar Spine: Normal bony mineralization. No focal lytic or blastic osseous lesion. Pelvis: Normal bony mineralization. No focal lytic or blastic osseous lesion. Right Upper Extremity: Normal bony mineralization. No focal lytic or blastic osseous lesion. Left Upper Extremity: Normal bony mineralization. No focal lytic or blastic osseous lesion. Right Lower Extremity: Normal bony mineralization. No focal lytic or blastic osseous lesion. Left Lower Extremity: Normal bony mineralization. No focal lytic or blastic osseous lesion. Additional Bilateral pleural effusions. Osteoarthritic changes of the bilateral shoulders. Calcific atherosclerotic disease of the carotid arteries, aorta and femoral arteries. IMPRESSION: No lytic or  sclerotic osseous lesions seen throughout the skeletal survey. Electronically Signed   By: Fidela Salisbury M.D.   On: 08/19/2021 14:07   IR Paracentesis  Result Date: 08/18/2021 INDICATION: Patient with history of AKI, newly diagnosed cirrhosis, and anasarca. Request for diagnostic only paracentesis with a maximum drain of 100 mL. EXAM: ULTRASOUND GUIDED DIAGNOSTIC PARACENTESIS MEDICATIONS: 10 mL 1% lidocaine COMPLICATIONS: None immediate. PROCEDURE: Informed written consent was obtained from the patient after a discussion of the risks, benefits and alternatives to treatment. A timeout was performed prior to the initiation of the procedure. Initial ultrasound scanning demonstrates a moderate amount of ascites within the right upper abdominal quadrant. The right upper abdomen was prepped and draped in the usual sterile fashion. 1% lidocaine was used for local anesthesia. Following this, a 6 Fr Safe-T-Centesis catheter was introduced. An ultrasound image was saved  for documentation purposes. The paracentesis was performed. The catheter was removed and a dressing was applied. The patient tolerated the procedure well without immediate post procedural complication. FINDINGS: A total of approximately 100 mL of hazy, yellow fluid was removed. Samples were sent to the laboratory as requested by the clinical team. Per order maximum of 100 mL was removed from right upper quadrant even though there was a moderate amount of ascites noted on ultrasound images remaining. IMPRESSION: Successful ultrasound-guided paracentesis yielding 100 mL of peritoneal fluid. Read by: Narda Rutherford, NP Electronically Signed   By: Miachel Roux M.D.   On: 08/18/2021 11:11   Medications:  sodium chloride Stopped (08/19/21 1233)   sodium chloride 10 mL/hr at 08/18/21 2046   albumin human 25 g (08/20/21 0640)   cefTRIAXone (ROCEPHIN)  IV Stopped (08/19/21 1025)   furosemide 160 mg (08/19/21 1909)    atorvastatin  40 mg Oral Daily    ezetimibe  10 mg Oral QPC supper   heparin  5,000 Units Subcutaneous Q8H   hydrALAZINE  25 mg Oral Q8H   isosorbide mononitrate  60 mg Oral Daily   labetalol  100 mg Oral BID   metolazone  5 mg Oral Daily   sodium chloride flush  3 mL Intravenous Q12H   tamsulosin  0.4 mg Oral Daily

## 2021-08-20 NOTE — Progress Notes (Signed)
PROGRESS NOTE    Corey Beard  LPF:[id number] DOB: 18-Oct-1946 DOA: 08/14/2021 PCP: Benito Mccreedy, MD    No chief complaint on file.   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 He is found to have bilateral pleural effusions R>L, with recent 2 L thoracentesis performed on 08/07/2021. Cr is up to 3.3 from baseline of 1.2-1.3 a year ago. He has 3+ proteinuria.  He was initially admitted by cardiology, transferred to [facility].  Nephrology consulted for evaluation of nephrotic syndrome. Further evaluation revealed cirrhosis of the liver.  GI consulted for evaluation of hepatorenal syndrome.  Assessment & Plan:   Principal Problem:   Nephrotic syndrome Active Problems:   Malignant neoplasm of prostate (HCC)   Coronary artery disease   Essential hypertension   Dyslipidemia   Anasarca with bilateral pleural effusions, proteinuria, AKI,  ascites and hypoalbuminemia: - differential include CHF vs nephrotic syndrome vs liver cirrhosis. - echocardiogram ruled out decompensated CHF.  - US renal Normal-appearing kidneys with the exception of a left lower pole renal cyst. - He is scheduled for renal biopsy and catheter placement in am by IR.  - nephrology on board for evaluation of nephrotic syndrome.  - diuresis with IV lasix 160 mg 3 times daily and metolazone.  - protein electrophoresis show monoclonal band, UPEP is pending. increased kappa and lambda light chains 79/2640.  Concern for amyloidosis. Complements are wnl. PTH is intact. Rpr is non reactive.  - heme onc consulted for evaluation for MM. Skeletal survey is negative for lytic or sclerotic osseous lesions throughout the skeletal survey. - US liver reveals cirrhosis, will follow up with an MRI with liver protocol. - Meanwhile GI consulted for new diagnosis  of cirrhosis. US paracentesis ordered for further eval.    AKI:  ? Nephrotic syndrome vs HRS vs amyloidosis.  Creatinine at baseline around 1.2 to 1.3, admitted with a  creatinine around 3.3 <3.56 <3.77. <4 >3.9<4.28 Potassium slightly up at 5.2 Currently on IV lasix 160 mg TID and metolazone.     Hypertension:  Well controlled.  - on hydralazine, imdur and lasix.    Hyperlipidemia Resume lipitor and zetia.   CAD;  No chest pain  On aspirin held.    Abnormal TSH.  TSH is 7.659, free T3 is 1.3 and free t4 wnl. Recommend checking thyroid panel in 4 weeks.    BPH:  On flomax.   Abnormal UA with urine culture showing multiple species. Repeat cultures are ordered.  Nephrology started him on IV rocephin.   Bilateral upper and lower extremity edema from the anasarca with weeping  - IV albumin infusions ordered.    Ascites  US paracentesis ordered and fluid analysis sent.   Body mass index is 33.74 kg/m. Obesity:  Fluid overloaded.  Continue to monitor.   Hyperkalemia:  Recheck levels tonight.    Anemia of chronic disease:  Hemoglobin around 10.  Iron stores are low.  He received IV iron infusion.     DVT prophylaxis: (Heparin) Code Status: Partial code.  Family Communication: none at bedside.  Disposition:   Status is: Inpatient  Remains inpatient appropriate because:Ongoing diagnostic testing needed not appropriate for outpatient work up, Unsafe d/c plan, and IV treatments appropriate due to intensity of illness or inability to take PO  Dispo: The patient is from: Home  Anticipated d/c is to:  pending.              Patient currently is not medically stable to d/c.   Difficult to place patient No       Consultants:  Nephrology.  Gastroenterology  Hematology oncology.   Procedures:  US liver.  US RENAL.  ECHO. Severe concentric hypertrophy of  the left ventricle. Normal global wall motion. Normal LV systolic function  with EF  58%. Doppler evidence of grade I (impaired) diastolic dysfunction,  normal LAP. Left atrial cavity is mildly dilated.   Antimicrobials:  Antibiotics Given (last 72 hours)     Date/Time Action Medication Dose Rate   08/19/21 0948 New Bag/Given  [lasix infusing, only one iv access]   cefTRIAXone (ROCEPHIN) 1 g in sodium chloride 0.9 % 100 mL IVPB 1 g 200 mL/hr   08/20/21 1139 New Bag/Given   cefTRIAXone (ROCEPHIN) 1 g in sodium chloride 0.9 % 100 mL IVPB 1 g 200 mL/hr         Subjective:  No new complaints today. Sitting in the chair.  Objective: Vitals:   08/19/21 1051 08/19/21 1943 08/20/21 0439 08/20/21 1053  BP: 123/66 138/66 126/66 (!) 137/53  Pulse: 78 75 64 74  Resp: 19 19 20  (!) 23  Temp: 97.7 F (36.5 C) 97.8 F (36.6 C) 97.6 F (36.4 C) 98 F (36.7 C)  TempSrc: Oral Oral Oral Oral  SpO2: 91% 97% 92% 96%  Weight:      Height:        Intake/Output Summary (Last 24 hours) at 08/20/2021 1704 Last data filed at 08/20/2021 1323 Gross per 24 hour  Intake 1212.56 ml  Output 250 ml  Net 962.56 ml    Filed Weights   08/17/21 0459 08/18/21 0451 08/19/21 0441  Weight: 117.5 kg 117.1 kg 116 kg    Examination:   General exam: Appears calm and comfortable  Respiratory system: diminished air entry at bases, on RA.  Cardiovascular system: S1 & S2 heard, RRR. Edematous upper and lower extremities.  Gastrointestinal system: Abdomen is soft, non tender, mildly distended. Normal bowel sounds heard. Central nervous system: Alert and oriented. No focal neurological deficits. Extremities: Symmetric 5 x 5 power. Skin: weeping from the upper extremities Psychiatry:  Mood & affect appropriate.      Data Reviewed: I have personally reviewed following labs and imaging studies  CBC: Recent Labs  Lab 08/14/21 2118 08/16/21 0404 08/18/21 0301 08/19/21 0239 08/20/21 0200  WBC 8.2  --  6.5 6.1 5.6  HGB 11.0* 8.9* 9.2* 8.5* 10.0*  HCT 33.2*  --  28.2* 25.8* 30.4*  MCV  87.4  --  87.0 88.1 86.9  PLT 291  --  286 280 334     Basic Metabolic Panel: Recent Labs  Lab 08/16/21 0404 08/17/21 0221 08/18/21 0301 08/19/21 0239 08/20/21 0729  NA 136 135 135 135 136  K 4.7 5.0 4.8 4.5 5.2*  CL 106 105 104 103 104  CO2 22 21* 22 22 21*  GLUCOSE 82 92 89 89 106*  BUN 65* 68* 74* 78* 85*  CREATININE 3.56* 3.77* 4.06* 3.96* 4.28*  CALCIUM 7.9*  7.9* 8.1* 8.3* 8.2* 8.4*  PHOS 4.9*  --   --   --  7.0*     GFR: Estimated Creatinine Clearance: 20.5 mL/min (A) (by C-G formula based on SCr of 4.28 mg/dL (H)).  Liver Function Tests: Recent Labs  Lab 08/14/21 2118 08/16/21 0404 08/17/21 0221 08/18/21 0301 08/19/21  5726 08/20/21 0729  AST 22  --  $R'19 15 16  'yT$ --   ALT 13  --  $R'12 11 10  'nJ$ --   ALKPHOS 60  --  63 48 46  --   BILITOT 0.4  --  0.5 0.6 0.8  --   PROT 4.3*  --  4.4* 4.5* 4.3*  --   ALBUMIN 1.7* 1.5* 1.6* 2.2*  2.2* 2.2* 2.5*     CBG: Recent Labs  Lab 08/15/21 2142 08/16/21 0550  GLUCAP 332* 85      Recent Results (from the past 240 hour(s))  Resp Panel by RT-PCR (Flu A&B, Covid) Nasopharyngeal Swab     Status: None   Collection Time: 08/14/21  7:57 PM   Specimen: Nasopharyngeal Swab; Nasopharyngeal(NP) swabs in vial transport medium  Result Value Ref Range Status   SARS Coronavirus 2 by RT PCR NEGATIVE NEGATIVE Final    Comment: (NOTE) SARS-CoV-2 target nucleic acids are NOT DETECTED.  The SARS-CoV-2 RNA is generally detectable in upper respiratory specimens during the acute phase of infection. The lowest concentration of SARS-CoV-2 viral copies this assay can detect is 138 copies/mL. A negative result does not preclude SARS-Cov-2 infection and should not be used as the sole basis for treatment or other patient management decisions. A negative result may occur with  improper specimen collection/handling, submission of specimen other than nasopharyngeal swab, presence of viral mutation(s) within the areas targeted by this  assay, and inadequate number of viral copies(<138 copies/mL). A negative result must be combined with clinical observations, patient history, and epidemiological information. The expected result is Negative.  Fact Sheet for Patients:  EntrepreneurPulse.com.au  Fact Sheet for Healthcare Providers:  IncredibleEmployment.be  This test is no t yet approved or cleared by the [place] FDA and  has been authorized for detection and/or diagnosis of SARS-CoV-2 by FDA under an Emergency Use Authorization (EUA). This EUA will remain  in effect (meaning this test can be used) for the duration of the COVID-19 declaration under Section 564(b)(1) of the Act, 21 U.S.C.section 360bbb-3(b)(1), unless the authorization is terminated  or revoked sooner.       Influenza A by PCR NEGATIVE NEGATIVE Final   Influenza B by PCR NEGATIVE NEGATIVE Final    Comment: (NOTE) The Xpert Xpress SARS-CoV-2/FLU/RSV plus assay is intended as an aid in the diagnosis of influenza from Nasopharyngeal swab specimens and should not be used as a sole basis for treatment. Nasal washings and aspirates are unacceptable for Xpert Xpress SARS-CoV-2/FLU/RSV testing.  Fact Sheet for Patients: EntrepreneurPulse.com.au  Fact Sheet for Healthcare Providers: IncredibleEmployment.be  This test is not yet approved or cleared by the [place] FDA and has been authorized for detection and/or diagnosis of SARS-CoV-2 by FDA under an Emergency Use Authorization (EUA). This EUA will remain in effect (meaning this test can be used) for the duration of the COVID-19 declaration under Section 564(b)(1) of the Act, 21 U.S.C. section 360bbb-3(b)(1), unless the authorization is terminated or revoked.  Performed at [facility], [place] [address], [place], [place] [address]   Urine Culture     Status: Abnormal   Collection Time: 08/15/21  7:56 AM    Specimen: Urine, Clean Catch  Result Value Ref Range Status   Specimen Description URINE, CLEAN CATCH  Final   Special Requests   Final    Normal Performed at [facility], [place] [address] [place], [place] [address]    Culture MULTIPLE SPECIES PRESENT, SUGGEST RECOLLECTION (  A)  Final   Report Status 08/16/2021 FINAL  Final  Urine Culture     Status: Abnormal   Collection Time: 08/17/21  5:24 AM   Specimen: Urine, Clean Catch  Result Value Ref Range Status   Specimen Description URINE, CLEAN CATCH  Final   Special Requests   Final    NONE Performed at [facility], [place] [address] [place], [place] [address]    Culture MULTIPLE SPECIES PRESENT, SUGGEST RECOLLECTION (A)  Final   Report Status 08/18/2021 FINAL  Final  Gram stain     Status: None   Collection Time: 08/18/21  9:27 AM   Specimen: Abdomen; Peritoneal Fluid  Result Value Ref Range Status   Specimen Description PERITONEAL FLUID  Final   Special Requests NONE  Final   Gram Stain   Final    WBC PRESENT, PREDOMINANTLY MONONUCLEAR NO ORGANISMS SEEN CYTOSPIN SMEAR Performed at [facility], [place] [address], [place], [place] [address]    Report Status 08/18/2021 FINAL  Final  Culture, body fluid w Gram Stain-bottle     Status: None (Preliminary result)   Collection Time: 08/18/21  9:27 AM   Specimen: Peritoneal Washings  Result Value Ref Range Status   Specimen Description PERITONEAL FLUID  Final   Special Requests NONE  Final   Culture   Final    NO GROWTH 2 DAYS Performed at [facility] [address], [place], [place] [address]    Report Status PENDING  Incomplete          Radiology Studies: DG Bone Survey Met  Result Date: 08/19/2021 CLINICAL DATA:  Metastatic bone survey. EXAM: METASTATIC BONE SURVEY COMPARISON:  MRI of the abdomen August 17, 2021 FINDINGS: Skull: Normal bony mineralization. No focal lytic or blastic osseous lesion. Cervical Spine: Normal bony mineralization. No  focal lytic or blastic osseous lesion. Thoracic Spine: Normal bony mineralization. No focal lytic or blastic osseous lesion. Chest: Normal bony mineralization. No focal lytic or blastic osseous lesion. Lumbar Spine: Normal bony mineralization. No focal lytic or blastic osseous lesion. Pelvis: Normal bony mineralization. No focal lytic or blastic osseous lesion. Right Upper Extremity: Normal bony mineralization. No focal lytic or blastic osseous lesion. Left Upper Extremity: Normal bony mineralization. No focal lytic or blastic osseous lesion. Right Lower Extremity: Normal bony mineralization. No focal lytic or blastic osseous lesion. Left Lower Extremity: Normal bony mineralization. No focal lytic or blastic osseous lesion. Additional Bilateral pleural effusions. Osteoarthritic changes of the bilateral shoulders. Calcific atherosclerotic disease of the carotid arteries, aorta and femoral arteries. IMPRESSION: No lytic or sclerotic osseous lesions seen throughout the skeletal survey. Electronically Signed   By: Fidela Salisbury M.D.   On: 08/19/2021 14:07        Scheduled Meds:  atorvastatin  40 mg Oral Daily   ezetimibe  10 mg Oral QPC supper   heparin  5,000 Units Subcutaneous Q8H   hydrALAZINE  25 mg Oral Q8H   isosorbide mononitrate  60 mg Oral Daily   metolazone  10 mg Oral Daily   sodium chloride flush  3 mL Intravenous Q12H   tamsulosin  0.4 mg Oral Daily   Continuous Infusions:  sodium chloride Stopped (08/19/21 1233)   sodium chloride 10 mL/hr at 08/18/21 2046   cefTRIAXone (ROCEPHIN)  IV 1 g (08/20/21 1139)   furosemide 160 mg (08/20/21 1425)     LOS: 6 days        Hosie Poisson, MD [facility]  To contact the attending provider between 7A-7P or the covering provider during after hours 7P-7A, please log into the web site [email or web address] and access using universal [place] password for that web site. If you do not have the password, please call the hospital  operator.  08/20/2021, 5:04 PM

## 2021-08-20 NOTE — Progress Notes (Signed)
Chief Complaint: Patient was seen in consultation today for renal biopsy and dialysis catheter placement  Referring Physician(s): Dr. Johnney Ou  Supervising Physician: Sandi Mariscal  Patient Status: [facility] - In-pt  History of Present Illness: Corey Beard is a 75 y.o. male being worked up for renal failure. IR is asked to perform image guided renal biopsy as well as placement of tunneled HD catheter. PMHx, meds, labs, imaging, allergies reviewed. Feels well, no recent fevers, chills, illness.     Past Medical History:  Diagnosis Date  . Arthritis   . Glaucoma, both eyes   . History of MI (myocardial infarction)    2002  . Nocturia   . PAF (paroxysmal atrial fibrillation) ([place])   . Prostate cancer [facility]) urologist-- dr eskridge    T1c, Gleason 3+3, PSA 9.83, vol 61cc  . S/P drug eluting coronary stent placement   . Type 2 diabetes mellitus ([place])   . Wears glasses     Past Surgical History:  Procedure Laterality Date  . CORONARY ANGIOPLASTY  07-01-2001  dr  Terrence Dupont   PCI to  pLAD, mRCA, ostium D1  . CORONARY ANGIOPLASTY  12-04-2001  dr Terrence Dupont   PCI  to  ostial D1,  pLAD  . CORONARY ANGIOPLASTY WITH STENT PLACEMENT  09-03-2002  dr Terrence Dupont   PCI and DES x1 to pLAD  . CYSTOSCOPY WITH BIOPSY  10/04/2015   Procedure: CYSTOSCOPY WITH BIOPSY;  Surgeon: Festus Aloe, MD;  Location: [facility];  Service: Urology;;  . INTRAVASCULAR PRESSURE WIRE/FFR STUDY N/A 07/23/2017   Procedure: INTRAVASCULAR PRESSURE WIRE/FFR STUDY;  Surgeon: Adrian Prows, MD;  Location: [place] [facility];  Service: Cardiovascular;  Laterality: N/A;  . IR PARACENTESIS  08/18/2021  . IR THORACENTESIS ASP PLEURAL SPACE W/IMG GUIDE  08/08/2021  . LEFT HEART CATH AND CORONARY ANGIOGRAPHY N/A 07/23/2017   Procedure: LEFT HEART CATH AND CORONARY ANGIOGRAPHY;  Surgeon: Adrian Prows, MD;  Location: [place] [facility];  Service: Cardiovascular;  Laterality: N/A;  . RADIOACTIVE SEED IMPLANT N/A  10/04/2015   Procedure: RADIOACTIVE SEED IMPLANT/BRACHYTHERAPY IMPLANT;  Surgeon: Festus Aloe, MD;  Location: [facility];  Service: Urology;  Laterality: N/A;  . REPAIR  INCARCERATED EPIGASTRIC HERNIA  06-04-2002    Allergies: Amlodipine  Medications:  Current Facility-Administered Medications:  .  0.9 %  sodium chloride infusion, 250 mL, Intravenous, PRN, Adrian Prows, MD, Stopped at 08/19/21 1233 .  0.9 %  sodium chloride infusion, , Intravenous, Continuous, Boisseau, Hayley, PA, Last Rate: 10 mL/hr at 08/18/21 2046, Restarted at 08/18/21 2046 .  acetaminophen (TYLENOL) tablet 650 mg, 650 mg, Oral, Q4H PRN, Adrian Prows, MD .  atorvastatin (LIPITOR) tablet 40 mg, 40 mg, Oral, Daily, Adrian Prows, MD, 40 mg at 08/20/21 0924 .  cefTRIAXone (ROCEPHIN) 1 g in sodium chloride 0.9 % 100 mL IVPB, 1 g, Intravenous, Q24H, Justin Mend, MD, Stopped at 08/19/21 1025 .  ezetimibe (ZETIA) tablet 10 mg, 10 mg, Oral, QPC supper, Adrian Prows, MD, 10 mg at 08/19/21 1747 .  furosemide (LASIX) 160 mg in dextrose 5 % 50 mL IVPB, 160 mg, Intravenous, TID WC & HS, Justin Mend, MD, Last Rate: 66 mL/hr at 08/20/21 0922, 160 mg at 08/20/21 0922 .  heparin injection 5,000 Units, 5,000 Units, Subcutaneous, Q8H, Monia Sabal, PA-C, 5,000 Units at 08/20/21 5427 .  hydrALAZINE (APRESOLINE) tablet 25 mg, 25 mg, Oral, Q8H, Adrian Prows, MD, 25 mg at 08/20/21 0623 .  isosorbide mononitrate (IMDUR) 24  hr tablet 60 mg, 60 mg, Oral, Daily, Adrian Prows, MD, 60 mg at 08/20/21 0924 .  metolazone (ZAROXOLYN) tablet 10 mg, 10 mg, Oral, Daily, Justin Mend, MD, 10 mg at 08/20/21 0924 .  ondansetron (ZOFRAN) injection 4 mg, 4 mg, Intravenous, Q6H PRN, Adrian Prows, MD .  sodium chloride flush (NS) 0.9 % injection 3 mL, 3 mL, Intravenous, Q12H, Adrian Prows, MD, 3 mL at 08/19/21 2155 .  sodium chloride flush (NS) 0.9 % injection 3 mL, 3 mL, Intravenous, PRN, Adrian Prows, MD .  tamsulosin (FLOMAX) capsule 0.4  mg, 0.4 mg, Oral, Daily, Adrian Prows, MD, 0.4 mg at 08/20/21 6967    Family History  Problem Relation Age of Onset  . Colon cancer Mother     Social History   Socioeconomic History  . Marital status: Married    Spouse name: Not on file  . Number of children: 2  . Years of education: Not on file  . Highest education level: Not on file  Occupational History  . Occupation: Physician  Tobacco Use  . Smoking status: Every Day    Packs/day: 0.50    Years: 40.00    Pack years: 20.00    Types: Cigarettes  . Smokeless tobacco: Never  Vaping Use  . Vaping Use: Never used  Substance and Sexual Activity  . Alcohol use: Yes    Comment: occasionally  . Drug use: No  . Sexual activity: Not on file  Other Topics Concern  . Not on file  Social History Narrative  . Not on file   Social Determinants of Health   Financial Resource Strain: Not on file  Food Insecurity: Not on file  Transportation Needs: Not on file  Physical Activity: Not on file  Stress: Not on file  Social Connections: Not on file    Review of Systems: A 12 point ROS discussed and pertinent positives are indicated in the HPI above.  All other systems are negative.  Review of Systems  Vital Signs: BP 126/66 (BP Location: Left Wrist)   Pulse 64   Temp 97.6 F (36.4 C) (Oral)   Resp 20   Ht $R'6\' 1"'pc$  (1.854 m)   Wt 116 kg   SpO2 92%   BMI 33.74 kg/m   Physical Exam Constitutional:      Appearance: Normal appearance. He is not ill-appearing.  HENT:     Mouth/Throat:     Mouth: Mucous membranes are moist.     Pharynx: Oropharynx is clear.  Cardiovascular:     Rate and Rhythm: Normal rate and regular rhythm.  Pulmonary:     Effort: Pulmonary effort is normal. No respiratory distress.     Breath sounds: Normal breath sounds.  Skin:    General: Skin is warm and dry.  Neurological:     General: No focal deficit present.     Mental Status: He is alert and oriented to person, place, and time.   Psychiatric:        Mood and Affect: Mood normal.        Thought Content: Thought content normal.        Judgment: Judgment normal.    Imaging: DG Chest 1 View  Result Date: 08/08/2021 CLINICAL DATA:  75 year old status post right-sided thoracentesis EXAM: CHEST  1 VIEW COMPARISON:  CT 08/02/2021 FINDINGS: Cardiomediastinal silhouette within normal limits in size and contour. Heart borders partially obscured by overlying lung/pleural disease. Calcifications of the aortic arch. Meniscus at the bilateral lung bases, larger  on the left. No pneumothorax. No confluent airspace disease.  No interlobular septal thickening. No displaced fracture IMPRESSION: No complicating features status post right-sided thoracentesis. Small bilateral pleural effusions persist. Electronically Signed   By: Gilmer Mor D.O.   On: 08/08/2021 11:18   DG Chest 2 View  Result Date: 08/14/2021 CLINICAL DATA:  Dyspnea. EXAM: CHEST - 2 VIEW COMPARISON:  Chest x-ray 08/08/2021. FINDINGS: Heart is enlarged, unchanged. There are atherosclerotic calcifications of the aorta. There are small bilateral pleural effusions, increasing from prior study. There is no evidence for pneumothorax. There some strandy opacities at the lung bases favored is atelectasis. No acute fractures are seen. IMPRESSION: 1. Increasing small bilateral pleural effusions. 2. Stable cardiomegaly. Electronically Signed   By: Darliss Cheney M.D.   On: 08/14/2021 21:15   MR LIVER WO CONRTAST  Result Date: 08/18/2021 CLINICAL DATA:  Evaluate for cirrhosis. EXAM: MRI ABDOMEN WITHOUT CONTRAST TECHNIQUE: Multiplanar multisequence MR imaging was performed without the administration of intravenous contrast. COMPARISON:  08/16/2021 FINDINGS: Exam detail is markedly diminished due to a number of factors most importantly however is diffuse motion artifact. Lower chest: There are moderate bilateral pleural effusions with atelectasis within both lower lobes, the right middle  lobe and lingula. Hepatobiliary: Imaging of the liver is significantly degraded by motion artifact as well as diffuse body wall edema and ascites. The study is not diagnostic for the evaluation of underlying liver lesion. Furthermore assessment of intrinsic liver abnormality cannot be adequately assessed. The gallbladder is visualized. No stones noted Pancreas:  Study not diagnostic for the evaluation of the pancreas. Spleen:  Appears normal in size. Adrenals/Urinary Tract: Suboptimally evaluated. A cyst is noted arising off the inferior pole of the left kidney measuring 2.6 cm. Stomach/Bowel: Diffuse gaseous distension of the colon is noted. Vascular/Lymphatic: Study not diagnostic for the assessment of vascular structures or the presence or absence of adenopathy. Other: Abdominal ascites identified. Volume of ascites is difficult to quantify due to factors discussed above. Musculoskeletal: No acute abnormality noted. IMPRESSION: 1. Markedly diminished exam detail due to a number of factors most importantly however, there is diffuse motion artifact. This is most likely secondary to shortness of breath due to bilateral pleural effusions and overall fluid overload state. As mentioned on 08/16/2021, MRI should only be performed when the patient is clinically stable, able to follow directions, and hold their breath. If cross-sectional imaging of the liver is currently indicated on an emergent basis then liver protocol CT without and with contrast material may be more appropriate as this is less susceptible to respiratory motion artifact. 2. Moderate bilateral pleural effusions, ascites, and diffuse anasarca. 3. Diffuse gaseous distension of the colon. Electronically Signed   By: Signa Kell M.D.   On: 08/18/2021 06:00   US RENAL  Result Date: 08/15/2021 CLINICAL DATA:  Nephrotic syndrome EXAM: RENAL / URINARY TRACT ULTRASOUND COMPLETE COMPARISON:  None. FINDINGS: Right Kidney: Renal measurements: 10.7 x 4.7 x  5.5 cm. = volume: 144 mL. Echogenicity within normal limits. No mass or hydronephrosis visualized. Left Kidney: Renal measurements: 11.2 x 6.8 x 5.2 cm. = volume: 208 mL. 2.7 cm lower pole cyst is noted in the left kidney. No mass lesion or hydronephrosis is noted. Bladder: Partially decompressed. The wall is thickened although likely related to the decompression. Other: Diffuse ascites is noted within the abdomen and pelvis. IMPRESSION: Diffuse ascites within the abdomen and pelvis. Normal-appearing kidneys with the exception of a left lower pole renal cyst. Electronically Signed  By: Inez Catalina M.D.   On: 08/15/2021 20:02   DG Bone Survey Met  Result Date: 08/19/2021 CLINICAL DATA:  Metastatic bone survey. EXAM: METASTATIC BONE SURVEY COMPARISON:  MRI of the abdomen August 17, 2021 FINDINGS: Skull: Normal bony mineralization. No focal lytic or blastic osseous lesion. Cervical Spine: Normal bony mineralization. No focal lytic or blastic osseous lesion. Thoracic Spine: Normal bony mineralization. No focal lytic or blastic osseous lesion. Chest: Normal bony mineralization. No focal lytic or blastic osseous lesion. Lumbar Spine: Normal bony mineralization. No focal lytic or blastic osseous lesion. Pelvis: Normal bony mineralization. No focal lytic or blastic osseous lesion. Right Upper Extremity: Normal bony mineralization. No focal lytic or blastic osseous lesion. Left Upper Extremity: Normal bony mineralization. No focal lytic or blastic osseous lesion. Right Lower Extremity: Normal bony mineralization. No focal lytic or blastic osseous lesion. Left Lower Extremity: Normal bony mineralization. No focal lytic or blastic osseous lesion. Additional Bilateral pleural effusions. Osteoarthritic changes of the bilateral shoulders. Calcific atherosclerotic disease of the carotid arteries, aorta and femoral arteries. IMPRESSION: No lytic or sclerotic osseous lesions seen throughout the skeletal survey. Electronically  Signed   By: Fidela Salisbury M.D.   On: 08/19/2021 14:07   VAS US RENAL ARTERY DUPLEX  Result Date: 08/16/2021 ABDOMINAL VISCERAL Patient Name:  Corey [facility]ard [facility]  Date of Exam:   08/16/2021 Medical Rec #: [id number]         Accession #:    [id number] Date of Birth: 1946/09/30          Patient Gender: M Patient Age:   55 years Exam Location:  [facility] Procedure:      VAS US RENAL ARTERY DUPLEX Referring Phys: [id number] LINDSAY A KRUSKA -------------------------------------------------------------------------------- Indications: AKI High Risk Factors: Hypertension, Diabetes. Limitations: Limited study. MD only wanted the renal veins to assess patency. Comparison Study: No prior studies. Performing Technologist: Oliver Hum RVT  Examination Guidelines: A complete evaluation includes B-mode imaging, spectral Doppler, color Doppler, and power Doppler as needed of all accessible portions of each vessel. Bilateral testing is considered an integral part of a complete examination. Limited examinations for reoccurring indications may be performed as noted.  Duplex Findings:  Technologist observations: The right and left renal veins appear patent.  Summary:  *See table(s) above for measurements and observations.  Diagnosing physician: Harold Barban MD  Electronically signed by Harold Barban MD on 08/16/2021 at 4:50:07 PM.    Final    US Abdomen Limited RUQ (LIVER/GB)  Result Date: 08/16/2021 CLINICAL DATA:  Cirrhosis/ascites EXAM: ULTRASOUND ABDOMEN LIMITED RIGHT UPPER QUADRANT COMPARISON:  None. FINDINGS: Gallbladder: No gallstones or wall thickening visualized. No sonographic Murphy sign noted by sonographer. Common bile duct: Diameter: 1.7 mL Liver: Nodular hepatic contour. No focal lesion identified. Decreased parenchymal echogenicity. Portal vein is patent on color Doppler imaging with normal direction of blood flow towards the liver. Other: At least small volume free fluid. At least trace volume right  pleural effusion. IMPRESSION: 1. Cirrhosis. Recommend MRI liver protocol for further evaluation of the hepatic parenchyma. When the patient is clinically stable and able to follow directions and hold their breath (preferably as an outpatient) further evaluation with dedicated abdominal MRI should be considered. 2. At least small volume ascites. 3. At least trace volume right pleural effusion. Electronically Signed   By: Iven Finn M.D.   On: 08/16/2021 18:55   IR Paracentesis  Result Date: 08/18/2021 INDICATION: Patient with history of AKI, newly diagnosed cirrhosis, and anasarca. Request  for diagnostic only paracentesis with a maximum drain of 100 mL. EXAM: ULTRASOUND GUIDED DIAGNOSTIC PARACENTESIS MEDICATIONS: 10 mL 1% lidocaine COMPLICATIONS: None immediate. PROCEDURE: Informed written consent was obtained from the patient after a discussion of the risks, benefits and alternatives to treatment. A timeout was performed prior to the initiation of the procedure. Initial ultrasound scanning demonstrates a moderate amount of ascites within the right upper abdominal quadrant. The right upper abdomen was prepped and draped in the usual sterile fashion. 1% lidocaine was used for local anesthesia. Following this, a 6 Fr Safe-T-Centesis catheter was introduced. An ultrasound image was saved for documentation purposes. The paracentesis was performed. The catheter was removed and a dressing was applied. The patient tolerated the procedure well without immediate post procedural complication. FINDINGS: A total of approximately 100 mL of hazy, yellow fluid was removed. Samples were sent to the laboratory as requested by the clinical team. Per order maximum of 100 mL was removed from right upper quadrant even though there was a moderate amount of ascites noted on ultrasound images remaining. IMPRESSION: Successful ultrasound-guided paracentesis yielding 100 mL of peritoneal fluid. Read by: Narda Rutherford, NP Electronically  Signed   By: Miachel Roux M.D.   On: 08/18/2021 11:11   IR THORACENTESIS ASP PLEURAL SPACE W/IMG GUIDE  Result Date: 08/08/2021 INDICATION: Pt with hx of CAD, HLD, HTN, prostate cancer and edema with recent anasarca and fluid overload with pleural effusions. Request for diagnostic and therapeutic thoracentesis. EXAM: ULTRASOUND GUIDED DIAGNOSTIC AND THERAPEUTIC THORACENTESIS MEDICATIONS: 36ml 1% lidocaine COMPLICATIONS: None immediate. PROCEDURE: An ultrasound guided thoracentesis was thoroughly discussed with the patient and questions answered. The benefits, risks, alternatives and complications were also discussed. The patient understands and wishes to proceed with the procedure. Written consent was obtained. Ultrasound was performed to localize and mark an adequate pocket of fluid in the right chest. The area was then prepped and draped in the normal sterile fashion. 1% Lidocaine was used for local anesthesia. Under ultrasound guidance a 6 Fr Safe-T-Centesis catheter was introduced. Thoracentesis was performed. The catheter was removed and a dressing applied. FINDINGS: A total of approximately 2 L of clear, yellow fluid was removed. Samples were sent to the laboratory as requested by the clinical team. IMPRESSION: Successful ultrasound guided right thoracentesis yielding 2 L of pleural fluid. Electronically Signed   By: Corrie Mckusick D.O.   On: 08/08/2021 13:05    Labs:  CBC: Recent Labs    08/14/21 2118 08/16/21 0404 08/18/21 0301 08/19/21 0239 08/20/21 0200  WBC 8.2  --  6.5 6.1 5.6  HGB 11.0* 8.9* 9.2* 8.5* 10.0*  HCT 33.2*  --  28.2* 25.8* 30.4*  PLT 291  --  286 280 334    COAGS: Recent Labs    08/16/21 0404  INR 1.0    BMP: Recent Labs    10/10/20 0810 08/14/21 2118 08/17/21 0221 08/18/21 0301 08/19/21 0239 08/20/21 0729  NA 136   < > 135 135 135 136  K 4.1   < > 5.0 4.8 4.5 5.2*  CL 97   < > 105 104 103 104  CO2 25   < > 21* 22 22 21*  GLUCOSE 91   < > 92 89 89  106*  BUN 22   < > 68* 74* 78* 85*  CALCIUM 9.3   < > 8.1* 8.3* 8.2* 8.4*  CREATININE 1.28*   < > 3.77* 4.06* 3.96* 4.28*  GFRNONAA 56*   < > 16* 15* 15* 14*  GFRAA 64  --   --   --   --   --    < > = values in this interval not displayed.    LIVER FUNCTION TESTS: Recent Labs    08/14/21 2118 08/16/21 0404 08/17/21 0221 08/18/21 0301 08/19/21 0239 08/20/21 0729  BILITOT 0.4  --  0.5 0.6 0.8  --   AST 22  --  $R'19 15 16  'Hi$ --   ALT 13  --  $R'12 11 10  'Je$ --   ALKPHOS 60  --  63 48 46  --   PROT 4.3*  --  4.4* 4.5* 4.3*  --   ALBUMIN 1.7*   < > 1.6* 2.2*  2.2* 2.2* 2.5*   < > = values in this interval not displayed.    TUMOR MARKERS: No results for input(s): AFPTM, CEA, CA199, CHROMGRNA in the last 8760 hours.  Assessment and Plan: Progressive renal failure Plan for image guided renal biopsy and placement of tunneled HD catheter. Labs reviewed. BP stable Risks and benefits of renal bx and HD catheter was discussed with the patient and/or patient's family including, but not limited to bleeding, infection, damage to adjacent structures or low yield requiring additional tests.  All of the questions were answered and there is agreement to proceed.  Consent signed and in chart.   Thank you for this interesting consult.  I greatly enjoyed meeting ARISTEO HANKERSON and look forward to participating in their care.  A copy of this report was sent to the requesting provider on this date.  Electronically Signed: Ascencion Dike, PA-C 08/20/2021, 10:05 AM   I spent a total of 20 minutes  in face to face in clinical consultation, greater than 50% of which was counseling/coordinating care for renal biopsy and HD cath

## 2021-08-20 NOTE — Progress Notes (Signed)
Pt had a pause 5.35 secs. HR at 50 now.  Notified provider.

## 2021-08-21 ENCOUNTER — Inpatient Hospital Stay (HOSPITAL_COMMUNITY): Payer: Medicare PPO

## 2021-08-21 ENCOUNTER — Other Ambulatory Visit (HOSPITAL_COMMUNITY): Payer: Self-pay | Admitting: Radiology

## 2021-08-21 ENCOUNTER — Other Ambulatory Visit: Payer: Self-pay | Admitting: Student

## 2021-08-21 ENCOUNTER — Other Ambulatory Visit (HOSPITAL_COMMUNITY): Payer: Self-pay

## 2021-08-21 DIAGNOSIS — K7460 Unspecified cirrhosis of liver: Secondary | ICD-10-CM | POA: Diagnosis not present

## 2021-08-21 DIAGNOSIS — N049 Nephrotic syndrome with unspecified morphologic changes: Secondary | ICD-10-CM | POA: Diagnosis not present

## 2021-08-21 DIAGNOSIS — N179 Acute kidney failure, unspecified: Secondary | ICD-10-CM | POA: Diagnosis not present

## 2021-08-21 DIAGNOSIS — D472 Monoclonal gammopathy: Secondary | ICD-10-CM | POA: Diagnosis not present

## 2021-08-21 HISTORY — PX: IR FLUORO GUIDE CV LINE RIGHT: IMG2283

## 2021-08-21 HISTORY — PX: IR US GUIDE VASC ACCESS RIGHT: IMG2390

## 2021-08-21 LAB — CBC
HCT: 28.7 % — ABNORMAL LOW (ref 39.0–52.0)
Hemoglobin: 9.3 g/dL — ABNORMAL LOW (ref 13.0–17.0)
MCH: 28.5 pg (ref 26.0–34.0)
MCHC: 32.4 g/dL (ref 30.0–36.0)
MCV: 88 fL (ref 80.0–100.0)
Platelets: 312 10*3/uL (ref 150–400)
RBC: 3.26 MIL/uL — ABNORMAL LOW (ref 4.22–5.81)
RDW: 15.7 % — ABNORMAL HIGH (ref 11.5–15.5)
WBC: 8.4 10*3/uL (ref 4.0–10.5)
nRBC: 0 % (ref 0.0–0.2)

## 2021-08-21 LAB — RENAL FUNCTION PANEL
Albumin: 2.3 g/dL — ABNORMAL LOW (ref 3.5–5.0)
Anion gap: 9 (ref 5–15)
BUN: 93 mg/dL — ABNORMAL HIGH (ref 8–23)
CO2: 24 mmol/L (ref 22–32)
Calcium: 8 mg/dL — ABNORMAL LOW (ref 8.9–10.3)
Chloride: 104 mmol/L (ref 98–111)
Creatinine, Ser: 4.8 mg/dL — ABNORMAL HIGH (ref 0.61–1.24)
GFR, Estimated: 12 mL/min — ABNORMAL LOW (ref >60)
Glucose, Bld: 85 mg/dL (ref 70–99)
Phosphorus: 7.2 mg/dL — ABNORMAL HIGH (ref 2.5–4.6)
Potassium: 4.9 mmol/L (ref 3.5–5.1)
Sodium: 137 mmol/L (ref 135–145)

## 2021-08-21 LAB — CYTOLOGY - NON PAP

## 2021-08-21 LAB — PROTIME-INR
INR: 1 (ref 0.8–1.2)
Prothrombin Time: 12.9 seconds (ref 11.4–15.2)

## 2021-08-21 MED ORDER — MIDAZOLAM HCL 2 MG/2ML IJ SOLN
INTRAMUSCULAR | Status: AC
  Filled 2021-08-21: qty 2

## 2021-08-21 MED ORDER — HEPARIN SODIUM (PORCINE) 5000 UNIT/ML IJ SOLN: 5000.0000 [IU] | Freq: Three times a day (TID) | INTRAMUSCULAR | Status: DC

## 2021-08-21 MED ORDER — LIDOCAINE-EPINEPHRINE 1 %-1:100000 IJ SOLN
INTRAMUSCULAR | Status: AC
  Administered 2021-08-21: 10 mL
  Filled 2021-08-21: qty 1

## 2021-08-21 MED ORDER — HEPARIN SODIUM (PORCINE) 1000 UNIT/ML IJ SOLN
INTRAMUSCULAR | Status: AC
  Filled 2021-08-21: qty 1

## 2021-08-21 MED ORDER — MIDAZOLAM HCL 2 MG/2ML IJ SOLN
INTRAMUSCULAR | Status: DC | PRN
  Administered 2021-08-21 – 2021-08-22 (×2): .5 mg via INTRAVENOUS

## 2021-08-21 MED ORDER — CEFAZOLIN SODIUM-DEXTROSE 2-4 GM/100ML-% IV SOLN
INTRAVENOUS | Status: AC
  Filled 2021-08-21: qty 100

## 2021-08-21 MED ORDER — CHLORHEXIDINE GLUCONATE CLOTH 2 % EX PADS
6.0000 | MEDICATED_PAD | Freq: Every day | CUTANEOUS | Status: DC
  Administered 2021-08-21 – 2021-09-01 (×11): 6 via TOPICAL

## 2021-08-21 MED ORDER — FENTANYL CITRATE (PF) 100 MCG/2ML IJ SOLN
INTRAMUSCULAR | Status: DC | PRN
  Administered 2021-08-21 – 2021-08-22 (×2): 25 ug via INTRAVENOUS

## 2021-08-21 MED ORDER — FENTANYL CITRATE (PF) 100 MCG/2ML IJ SOLN
INTRAMUSCULAR | Status: AC
  Filled 2021-08-21: qty 2

## 2021-08-21 NOTE — Progress Notes (Signed)
PROGRESS NOTE    Corey Beard  YIR:[id number] DOB: 20-Apr-1946 DOA: 08/14/2021 PCP: Benito Mccreedy, MD    No chief complaint on file.   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 He is found to have bilateral pleural effusions R>L, with recent 2 L thoracentesis performed on 08/07/2021. Cr  on admission up at 3.3 from baseline of 1.2-1.3 a year ago. He has 3+ proteinuria. He was initially admitted by cardiology, transferred to [facility].  Nephrology consulted for evaluation of nephrotic syndrome. Pt is scheduled for renal biopsy and BM biopsy tomorrow for evaluation of amyloidosis.  He underwent successful placement of tunneled HD catheter on 08/21/2021.  Assessment & Plan:   Principal Problem:   Nephrotic syndrome Active Problems:   Malignant neoplasm of prostate (HCC)   Coronary artery disease   Essential hypertension   Dyslipidemia   Anasarca with bilateral pleural effusions, proteinuria, AKI,  ascites and hypoalbuminemia: - differential include  nephrotic syndrome vs liver cirrhosis vs amyloidosis - echocardiogram ruled out decompensated CHF.  - US renal Normal-appearing kidneys with the exception of a left lower pole renal cyst. - pt underwent HD catheter placement on 08/21/21 - He is scheduled for renal biopsy and BM biopsy tomorrow.  - diuresis with IV lasix 160 mg 3 times daily and metolazone.  - protein electrophoresis show monoclonal band, UPEP is pending. increased kappa and lambda light chains 79/2640.  Concern for amyloidosis. Complements are wnl. PTH is intact. Rpr is non reactive.  - Nephrology on board and appreciate recommendations.  - heme onc consulted for evaluation for MM. Skeletal survey is negative for lytic or sclerotic osseous lesions throughout the skeletal survey. - US liver reveals  cirrhosis with out any signs of portal hypertension.  - Meanwhile GI consulted for new diagnosis of cirrhosis, recommended if renal biopsy not diagnostic, recommended liver biopsy with HVPG measurements .    AKI:  ? Nephrotic syndrome vs HRS vs amyloidosis.  Creatinine at baseline around 1.2 to 1.3, admitted with a  creatinine around 3.3 <3.56 <3.77. <4 >3.9<4.28 Potassium slightly up at 5.2 Currently on IV lasix 160 mg TID and metolazone.     Hypertension:  Well controlled.  - on hydralazine, imdur and lasix.    Hyperlipidemia Resume lipitor and zetia.   CAD;  No chest pain  On aspirin held.    Abnormal TSH.  TSH is 7.659, free T3 is 1.3 and free t4 wnl. Recommend checking thyroid panel in 4 weeks.    BPH:  On flomax.   Abnormal UA with urine culture showing multiple species. Repeat cultures are ordered.  Nephrology started him on IV rocephin.   Bilateral upper and lower extremity edema from the anasarca with weeping  - IV albumin infusions ordered.    Ascites  US paracentesis ordered and fluid analysis sent.   Body mass index is 34.29 kg/m. Obesity:  Fluid overloaded.  Continue to monitor.   Hyperkalemia:  Recheck levels tonight.    Anemia of chronic disease:  Hemoglobin around 10.  Iron stores are low.  He received IV iron infusion.     DVT prophylaxis: (Heparin) on hold,  Code Status: Partial code.  Family Communication: none at bedside.  Disposition:   Status is: Inpatient  Remains inpatient appropriate because:Ongoing diagnostic testing needed not appropriate for outpatient work up, Unsafe d/c plan, and IV treatments appropriate  due to intensity of illness or inability to take PO  Dispo: The patient is from: Home              Anticipated d/c is to:  pending.              Patient currently is not medically stable to d/c.   Difficult to place patient No       Consultants:  Nephrology.  Gastroenterology  Hematology oncology.    Procedures:  US liver.  US RENAL.  ECHO. Severe concentric hypertrophy of  the left ventricle. Normal global wall motion. Normal LV systolic function  with EF 58%. Doppler evidence of grade I (impaired) diastolic dysfunction,  normal LAP. Left atrial cavity is mildly dilated.   Antimicrobials:  Antibiotics Given (last 72 hours)     Date/Time Action Medication Dose Rate   08/19/21 0948 New Bag/Given  [lasix infusing, only one iv access]   cefTRIAXone (ROCEPHIN) 1 g in sodium chloride 0.9 % 100 mL IVPB 1 g 200 mL/hr   08/20/21 1139 New Bag/Given   cefTRIAXone (ROCEPHIN) 1 g in sodium chloride 0.9 % 100 mL IVPB 1 g 200 mL/hr   08/21/21 0813 New Bag/Given   cefTRIAXone (ROCEPHIN) 1 g in sodium chloride 0.9 % 100 mL IVPB 1 g 200 mL/hr         Subjective: Reports feeling okay. Bowel movement yesterday.   Objective: Vitals:   08/21/21 1209 08/21/21 1215 08/21/21 1220 08/21/21 1225  BP: (!) 148/70 131/63 (!) 114/59 116/65  Pulse: 66 65 65 64  Resp: 15 14 14 10   Temp:      TempSrc:      SpO2: 97% 96% 97% 97%  Weight:      Height:        Intake/Output Summary (Last 24 hours) at 08/21/2021 1232 Last data filed at 08/21/2021 1100 Gross per 24 hour  Intake 586 ml  Output 1450 ml  Net -864 ml    Filed Weights   08/18/21 0451 08/19/21 0441 08/21/21 0222  Weight: 117.1 kg 116 kg 117.9 kg    Examination:  General exam: Appears calm and comfortable  Respiratory system: Clear to auscultation. Respiratory effort normal. Cardiovascular system: S1 & S2 heard, RRR. Edematous upper and lower extremities.  Gastrointestinal system: Abdomen is nondistended, soft and nontender. Normal bowel sounds heard. Central nervous system: Alert and oriented. No focal neurological deficits. Extremities: Symmetric 5 x 5 power. Skin: No rashes, lesions or ulcers Psychiatry:  Mood & affect appropriate.      Data Reviewed: I have personally reviewed following labs and imaging  studies  CBC: Recent Labs  Lab 08/14/21 2118 08/16/21 0404 08/18/21 0301 08/19/21 0239 08/20/21 0200 08/21/21 0355  WBC 8.2  --  6.5 6.1 5.6 8.4  HGB 11.0* 8.9* 9.2* 8.5* 10.0* 9.3*  HCT 33.2*  --  28.2* 25.8* 30.4* 28.7*  MCV 87.4  --  87.0 88.1 86.9 88.0  PLT 291  --  286 280 334 312     Basic Metabolic Panel: Recent Labs  Lab 08/16/21 0404 08/17/21 0221 08/18/21 0301 08/19/21 0239 08/20/21 0729 08/20/21 1747 08/21/21 0355  NA 136 135 135 135 136  --  137  K 4.7 5.0 4.8 4.5 5.2* 5.0 4.9  CL 106 105 104 103 104  --  104  CO2 22 21* 22 22 21*  --  24  GLUCOSE 82 92 89 89 106*  --  85  BUN 65* 68* 74* 78* 85*  --  93*  CREATININE 3.56* 3.77* 4.06* 3.96* 4.28*  --  4.80*  CALCIUM 7.9*  7.9* 8.1* 8.3* 8.2* 8.4*  --  8.0*  PHOS 4.9*  --   --   --  7.0*  --  7.2*     GFR: Estimated Creatinine Clearance: 18.4 mL/min (A) (by C-G formula based on SCr of 4.8 mg/dL (H)).  Liver Function Tests: Recent Labs  Lab 08/14/21 2118 08/16/21 0404 08/17/21 0221 08/18/21 0301 08/19/21 0239 08/20/21 0729 08/21/21 0355  AST 22  --  19 15 16   --   --   ALT 13  --  12 11 10   --   --   ALKPHOS 60  --  63 48 46  --   --   BILITOT 0.4  --  0.5 0.6 0.8  --   --   PROT 4.3*  --  4.4* 4.5* 4.3*  --   --   ALBUMIN 1.7*   < > 1.6* 2.2*  2.2* 2.2* 2.5* 2.3*   < > = values in this interval not displayed.     CBG: Recent Labs  Lab 08/15/21 2142 08/16/21 0550  GLUCAP 332* 85      Recent Results (from the past 240 hour(s))  Resp Panel by RT-PCR (Flu A&B, Covid) Nasopharyngeal Swab     Status: None   Collection Time: 08/14/21  7:57 PM   Specimen: Nasopharyngeal Swab; Nasopharyngeal(NP) swabs in vial transport medium  Result Value Ref Range Status   SARS Coronavirus 2 by RT PCR NEGATIVE NEGATIVE Final    Comment: (NOTE) SARS-CoV-2 target nucleic acids are NOT DETECTED.  The SARS-CoV-2 RNA is generally detectable in upper respiratory specimens during the acute phase of  infection. The lowest concentration of SARS-CoV-2 viral copies this assay can detect is 138 copies/mL. A negative result does not preclude SARS-Cov-2 infection and should not be used as the sole basis for treatment or other patient management decisions. A negative result may occur with  improper specimen collection/handling, submission of specimen other than nasopharyngeal swab, presence of viral mutation(s) within the areas targeted by this assay, and inadequate number of viral copies(<138 copies/mL). A negative result must be combined with clinical observations, patient history, and epidemiological information. The expected result is Negative.  Fact Sheet for Patients:  EntrepreneurPulse.com.au  Fact Sheet for Healthcare Providers:  IncredibleEmployment.be  This test is no t yet approved or cleared by the [place] FDA and  has been authorized for detection and/or diagnosis of SARS-CoV-2 by FDA under an Emergency Use Authorization (EUA). This EUA will remain  in effect (meaning this test can be used) for the duration of the COVID-19 declaration under Section 564(b)(1) of the Act, 21 U.S.C.section 360bbb-3(b)(1), unless the authorization is terminated  or revoked sooner.       Influenza A by PCR NEGATIVE NEGATIVE Final   Influenza B by PCR NEGATIVE NEGATIVE Final    Comment: (NOTE) The Xpert Xpress SARS-CoV-2/FLU/RSV plus assay is intended as an aid in the diagnosis of influenza from Nasopharyngeal swab specimens and should not be used as a sole basis for treatment. Nasal washings and aspirates are unacceptable for Xpert Xpress SARS-CoV-2/FLU/RSV testing.  Fact Sheet for Patients: EntrepreneurPulse.com.au  Fact Sheet for Healthcare Providers: IncredibleEmployment.be  This test is not yet approved or cleared by the [place] FDA and has been authorized for detection and/or diagnosis of SARS-CoV-2  by FDA under an Emergency Use Authorization (EUA). This EUA will remain in effect (meaning this test can  be used) for the duration of the COVID-19 declaration under Section 564(b)(1) of the Act, 21 U.S.C. section 360bbb-3(b)(1), unless the authorization is terminated or revoked.  Performed at [facility], [place] [address], [place], [place] [address]   Urine Culture     Status: Abnormal   Collection Time: 08/15/21  7:56 AM   Specimen: Urine, Clean Catch  Result Value Ref Range Status   Specimen Description URINE, CLEAN CATCH  Final   Special Requests   Final    Normal Performed at [facility], [place] [address], [place], [place] [address]    Culture MULTIPLE SPECIES PRESENT, SUGGEST RECOLLECTION (A)  Final   Report Status 08/16/2021 FINAL  Final  Urine Culture     Status: Abnormal   Collection Time: 08/17/21  5:24 AM   Specimen: Urine, Clean Catch  Result Value Ref Range Status   Specimen Description URINE, CLEAN CATCH  Final   Special Requests   Final    NONE Performed at [facility], [place] [address], [place], [place] [address]    Culture MULTIPLE SPECIES PRESENT, SUGGEST RECOLLECTION (A)  Final   Report Status 08/18/2021 FINAL  Final  Gram stain     Status: None   Collection Time: 08/18/21  9:27 AM   Specimen: Abdomen; Peritoneal Fluid  Result Value Ref Range Status   Specimen Description PERITONEAL FLUID  Final   Special Requests NONE  Final   Gram Stain   Final    WBC PRESENT, PREDOMINANTLY MONONUCLEAR NO ORGANISMS SEEN CYTOSPIN SMEAR Performed at [facility], [place] [address] [place], [place] [address]    Report Status 08/18/2021 FINAL  Final  Culture, body fluid w Gram Stain-bottle     Status: None (Preliminary result)   Collection Time: 08/18/21  9:27 AM   Specimen: Peritoneal Washings  Result Value Ref Range Status   Specimen Description PERITONEAL FLUID  Final   Special Requests NONE  Final   Culture   Final    NO GROWTH 3  DAYS Performed at [facility] [address], [place], [place] [address]    Report Status PENDING  Incomplete          Radiology Studies: No results found.      Scheduled Meds:  atorvastatin  40 mg Oral Daily   ezetimibe  10 mg Oral QPC supper   [START ON 08/23/2021] heparin  5,000 Units Subcutaneous Q8H   heparin sodium (porcine)       hydrALAZINE  25 mg Oral Q8H   isosorbide mononitrate  60 mg Oral Daily   metolazone  10 mg Oral Daily   sodium chloride flush  3 mL Intravenous Q12H   tamsulosin  0.4 mg Oral Daily   Continuous Infusions:  sodium chloride Stopped (08/19/21 1233)   sodium chloride 10 mL/hr at 08/18/21 2046   furosemide 160 mg (08/21/21 0703)     LOS: 7 days        Hosie Poisson, MD [facility]   To contact the attending provider between 7A-7P or the covering provider during after hours 7P-7A, please log into the web site [email or web address] and access using universal [place] password for that web site. If you do not have the password, please call the hospital operator.  08/21/2021, 12:32 PM

## 2021-08-21 NOTE — Consult Note (Addendum)
Chief Complaint: Patient was seen in consultation today for Bone marrow biopsy at the request of Dr Ross Marcus  Supervising Physician: Simonne Come  Patient Status: [facility] - In-pt  History of Present Illness: Corey Beard is a 75 y.o. male   Pt was scheduled for tunn dialysis catheter Random renal biopsy per Nephrology this am Biopsy was requested last week- but pt was still on ASA---- now off x 5 days today Was scheduled this am-- but received am dose Hep injection at 7 am today. We can still place Tunn catheter today Hep injection ordered held now til 10/12  Will hold random renal bx til 10/11 plus Bone marrow bx 10/11 am  Has been seen by Dr Myna Hidalgo Amyloidosis? Monoclonal gammopathy with unknown significance BM Bx scheduled for 10/11   Past Medical History:  Diagnosis Date   Arthritis    Glaucoma, both eyes    History of MI (myocardial infarction)    2002   Nocturia    PAF (paroxysmal atrial fibrillation) (HCC)    Prostate cancer [facility]) urologist-- dr eskridge    T1c, Gleason 3+3, PSA 9.83, vol 61cc   S/P drug eluting coronary stent placement    Type 2 diabetes mellitus (HCC)    Wears glasses     Past Surgical History:  Procedure Laterality Date   CORONARY ANGIOPLASTY  07-01-2001  dr  Sharyn Lull   PCI to  pLAD, mRCA, ostium D1   CORONARY ANGIOPLASTY  12-04-2001  dr Sharyn Lull   PCI  to  ostial D1,  pLAD   CORONARY ANGIOPLASTY WITH STENT PLACEMENT  09-03-2002  dr Sharyn Lull   PCI and DES x1 to pLAD   CYSTOSCOPY WITH BIOPSY  10/04/2015   Procedure: CYSTOSCOPY WITH BIOPSY;  Surgeon: Jerilee Field, MD;  Location: [facility];  Service: Urology;;   INTRAVASCULAR PRESSURE WIRE/FFR STUDY N/A 07/23/2017   Procedure: INTRAVASCULAR PRESSURE WIRE/FFR STUDY;  Surgeon: Yates Decamp, MD;  Location: [facility] INVASIVE CV LAB;  Service: Cardiovascular;  Laterality: N/A;   IR PARACENTESIS  08/18/2021   IR THORACENTESIS ASP PLEURAL SPACE W/IMG GUIDE  08/08/2021   LEFT HEART  CATH AND CORONARY ANGIOGRAPHY N/A 07/23/2017   Procedure: LEFT HEART CATH AND CORONARY ANGIOGRAPHY;  Surgeon: Yates Decamp, MD;  Location: [facility] INVASIVE CV LAB;  Service: Cardiovascular;  Laterality: N/A;   RADIOACTIVE SEED IMPLANT N/A 10/04/2015   Procedure: RADIOACTIVE SEED IMPLANT/BRACHYTHERAPY IMPLANT;  Surgeon: Jerilee Field, MD;  Location: [facility];  Service: Urology;  Laterality: N/A;   REPAIR  INCARCERATED EPIGASTRIC HERNIA  06-04-2002    Allergies: Amlodipine  Medications: Prior to Admission medications   Medication Sig Start Date End Date Taking? Authorizing Provider  aspirin EC 325 MG tablet Take 325 mg by mouth daily.   Yes [provider]  atorvastatin (LIPITOR) 40 MG tablet Take 1 tablet (40 mg total) by mouth daily. 06/23/21  Yes Yates Decamp, MD  ezetimibe (ZETIA) 10 MG tablet Take 1 tablet (10 mg total) by mouth daily after supper. 06/23/21 06/18/22 Yes Yates Decamp, MD  L-ARGININE-500 PO Take 1,000 mg by mouth daily.   Yes [provider]  labetalol (NORMODYNE) 100 MG tablet Take 1 tablet (100 mg total) by mouth 2 (two) times daily. 06/23/21  Yes Yates Decamp, MD  Multiple Vitamins-Minerals (MULTIVITAMIN PO) Take 1 tablet by mouth daily.   Yes [provider]  olmesartan (BENICAR) 40 MG tablet Take 1 tablet (40 mg total) by mouth daily. 06/23/21  Yes Yates Decamp, MD  potassium chloride (KLOR-CON) 10 MEQ tablet Take 10 mEq by mouth daily. 05/23/21  Yes [provider]  tamsulosin (FLOMAX) 0.4 MG CAPS capsule Take 0.4 mg by mouth daily.   Yes [provider]  torsemide (DEMADEX) 20 MG tablet Take 1 tablet (20 mg total) by mouth daily. Take 1 tab twice daily if leg swelling is worse Patient taking differently: Take 20 mg by mouth 2 (two) times daily. 05/12/21 05/07/22 Yes Adrian Prows, MD     Family History  Problem Relation Age of Onset   Colon cancer Mother     Social History   Socioeconomic History   Marital status: Married     Spouse name: Not on file   Number of children: 2   Years of education: Not on file   Highest education level: Not on file  Occupational History   Occupation: Physician  Tobacco Use   Smoking status: Every Day    Packs/day: 0.50    Years: 40.00    Pack years: 20.00    Types: Cigarettes   Smokeless tobacco: Never  Vaping Use   Vaping Use: Never used  Substance and Sexual Activity   Alcohol use: Yes    Comment: occasionally   Drug use: No   Sexual activity: Not on file  Other Topics Concern   Not on file  Social History Narrative   Not on file   Social Determinants of Health   Financial Resource Strain: Not on file  Food Insecurity: Not on file  Transportation Needs: Not on file  Physical Activity: Not on file  Stress: Not on file  Social Connections: Not on file    Review of Systems: A 12 point ROS discussed and pertinent positives are indicated in the HPI above.  All other systems are negative.  Review of Systems  Constitutional:  Positive for activity change and fatigue. Negative for fever.  Respiratory:  Positive for shortness of breath. Negative for cough.   Cardiovascular:  Negative for chest pain.  Neurological:  Positive for weakness.  Psychiatric/Behavioral:  Negative for behavioral problems and confusion.    Vital Signs: BP 131/61 (BP Location: Left Wrist)   Pulse 66   Temp 97.7 F (36.5 C) (Oral)   Resp 20   Ht $R'6\' 1"'DE$  (1.854 m)   Wt 259 lb 14.8 oz (117.9 kg)   SpO2 96%   BMI 34.29 kg/m   Physical Exam Vitals reviewed.  HENT:     Mouth/Throat:     Mouth: Mucous membranes are moist.  Cardiovascular:     Rate and Rhythm: Normal rate and regular rhythm.     Heart sounds: Normal heart sounds.  Pulmonary:     Breath sounds: Rales present.  Musculoskeletal:        General: Swelling present. Normal range of motion.  Skin:    General: Skin is warm.  Neurological:     Mental Status: He is alert and oriented to person, place, and time.   Psychiatric:        Behavior: Behavior normal.    Imaging: DG Chest 1 View  Result Date: 08/08/2021 CLINICAL DATA:  75 year old status post right-sided thoracentesis EXAM: CHEST  1 VIEW COMPARISON:  CT 08/02/2021 FINDINGS: Cardiomediastinal silhouette within normal limits in size and contour. Heart borders partially obscured by overlying lung/pleural disease. Calcifications of the aortic arch. Meniscus at the bilateral lung bases, larger on the left. No pneumothorax. No confluent airspace disease.  No interlobular septal thickening. No displaced fracture IMPRESSION: No complicating  features status post right-sided thoracentesis. Small bilateral pleural effusions persist. Electronically Signed   By: Corrie Mckusick D.O.   On: 08/08/2021 11:18   DG Chest 2 View  Result Date: 08/14/2021 CLINICAL DATA:  Dyspnea. EXAM: CHEST - 2 VIEW COMPARISON:  Chest x-ray 08/08/2021. FINDINGS: Heart is enlarged, unchanged. There are atherosclerotic calcifications of the aorta. There are small bilateral pleural effusions, increasing from prior study. There is no evidence for pneumothorax. There some strandy opacities at the lung bases favored is atelectasis. No acute fractures are seen. IMPRESSION: 1. Increasing small bilateral pleural effusions. 2. Stable cardiomegaly. Electronically Signed   By: Ronney Asters M.D.   On: 08/14/2021 21:15   MR LIVER WO CONRTAST  Result Date: 08/18/2021 CLINICAL DATA:  Evaluate for cirrhosis. EXAM: MRI ABDOMEN WITHOUT CONTRAST TECHNIQUE: Multiplanar multisequence MR imaging was performed without the administration of intravenous contrast. COMPARISON:  08/16/2021 FINDINGS: Exam detail is markedly diminished due to a number of factors most importantly however is diffuse motion artifact. Lower chest: There are moderate bilateral pleural effusions with atelectasis within both lower lobes, the right middle lobe and lingula. Hepatobiliary: Imaging of the liver is significantly degraded by  motion artifact as well as diffuse body wall edema and ascites. The study is not diagnostic for the evaluation of underlying liver lesion. Furthermore assessment of intrinsic liver abnormality cannot be adequately assessed. The gallbladder is visualized. No stones noted Pancreas:  Study not diagnostic for the evaluation of the pancreas. Spleen:  Appears normal in size. Adrenals/Urinary Tract: Suboptimally evaluated. A cyst is noted arising off the inferior pole of the left kidney measuring 2.6 cm. Stomach/Bowel: Diffuse gaseous distension of the colon is noted. Vascular/Lymphatic: Study not diagnostic for the assessment of vascular structures or the presence or absence of adenopathy. Other: Abdominal ascites identified. Volume of ascites is difficult to quantify due to factors discussed above. Musculoskeletal: No acute abnormality noted. IMPRESSION: 1. Markedly diminished exam detail due to a number of factors most importantly however, there is diffuse motion artifact. This is most likely secondary to shortness of breath due to bilateral pleural effusions and overall fluid overload state. As mentioned on 08/16/2021, MRI should only be performed when the patient is clinically stable, able to follow directions, and hold their breath. If cross-sectional imaging of the liver is currently indicated on an emergent basis then liver protocol CT without and with contrast material may be more appropriate as this is less susceptible to respiratory motion artifact. 2. Moderate bilateral pleural effusions, ascites, and diffuse anasarca. 3. Diffuse gaseous distension of the colon. Electronically Signed   By: Kerby Moors M.D.   On: 08/18/2021 06:00   US RENAL  Result Date: 08/15/2021 CLINICAL DATA:  Nephrotic syndrome EXAM: RENAL / URINARY TRACT ULTRASOUND COMPLETE COMPARISON:  None. FINDINGS: Right Kidney: Renal measurements: 10.7 x 4.7 x 5.5 cm. = volume: 144 mL. Echogenicity within normal limits. No mass or  hydronephrosis visualized. Left Kidney: Renal measurements: 11.2 x 6.8 x 5.2 cm. = volume: 208 mL. 2.7 cm lower pole cyst is noted in the left kidney. No mass lesion or hydronephrosis is noted. Bladder: Partially decompressed. The wall is thickened although likely related to the decompression. Other: Diffuse ascites is noted within the abdomen and pelvis. IMPRESSION: Diffuse ascites within the abdomen and pelvis. Normal-appearing kidneys with the exception of a left lower pole renal cyst. Electronically Signed   By: Inez Catalina M.D.   On: 08/15/2021 20:02   DG Bone Survey Met  Result Date: 08/19/2021  CLINICAL DATA:  Metastatic bone survey. EXAM: METASTATIC BONE SURVEY COMPARISON:  MRI of the abdomen August 17, 2021 FINDINGS: Skull: Normal bony mineralization. No focal lytic or blastic osseous lesion. Cervical Spine: Normal bony mineralization. No focal lytic or blastic osseous lesion. Thoracic Spine: Normal bony mineralization. No focal lytic or blastic osseous lesion. Chest: Normal bony mineralization. No focal lytic or blastic osseous lesion. Lumbar Spine: Normal bony mineralization. No focal lytic or blastic osseous lesion. Pelvis: Normal bony mineralization. No focal lytic or blastic osseous lesion. Right Upper Extremity: Normal bony mineralization. No focal lytic or blastic osseous lesion. Left Upper Extremity: Normal bony mineralization. No focal lytic or blastic osseous lesion. Right Lower Extremity: Normal bony mineralization. No focal lytic or blastic osseous lesion. Left Lower Extremity: Normal bony mineralization. No focal lytic or blastic osseous lesion. Additional Bilateral pleural effusions. Osteoarthritic changes of the bilateral shoulders. Calcific atherosclerotic disease of the carotid arteries, aorta and femoral arteries. IMPRESSION: No lytic or sclerotic osseous lesions seen throughout the skeletal survey. Electronically Signed   By: Fidela Salisbury M.D.   On: 08/19/2021 14:07   VAS US  RENAL ARTERY DUPLEX  Result Date: 08/16/2021 ABDOMINAL VISCERAL Patient Name:  Corey Beard [facility]  Date of Exam:   08/16/2021 Medical Rec #: [id number]         Accession #:    [id number] Date of Birth: 04/21/1946          Patient Gender: M Patient Age:   51 years Exam Location:  [facility] Procedure:      VAS US RENAL ARTERY DUPLEX Referring Phys: [id number] LINDSAY A KRUSKA -------------------------------------------------------------------------------- Indications: AKI High Risk Factors: Hypertension, Diabetes. Limitations: Limited study. MD only wanted the renal veins to assess patency. Comparison Study: No prior studies. Performing Technologist: Oliver Hum RVT  Examination Guidelines: A complete evaluation includes B-mode imaging, spectral Doppler, color Doppler, and power Doppler as needed of all accessible portions of each vessel. Bilateral testing is considered an integral part of a complete examination. Limited examinations for reoccurring indications may be performed as noted.  Duplex Findings:  Technologist observations: The right and left renal veins appear patent.  Summary:  *See table(s) above for measurements and observations.  Diagnosing physician: Harold Barban MD  Electronically signed by Harold Barban MD on 08/16/2021 at 4:50:07 PM.    Final    US Abdomen Limited RUQ (LIVER/GB)  Result Date: 08/16/2021 CLINICAL DATA:  Cirrhosis/ascites EXAM: ULTRASOUND ABDOMEN LIMITED RIGHT UPPER QUADRANT COMPARISON:  None. FINDINGS: Gallbladder: No gallstones or wall thickening visualized. No sonographic Murphy sign noted by sonographer. Common bile duct: Diameter: 1.7 mL Liver: Nodular hepatic contour. No focal lesion identified. Decreased parenchymal echogenicity. Portal vein is patent on color Doppler imaging with normal direction of blood flow towards the liver. Other: At least small volume free fluid. At least trace volume right pleural effusion. IMPRESSION: 1. Cirrhosis. Recommend MRI liver protocol  for further evaluation of the hepatic parenchyma. When the patient is clinically stable and able to follow directions and hold their breath (preferably as an outpatient) further evaluation with dedicated abdominal MRI should be considered. 2. At least small volume ascites. 3. At least trace volume right pleural effusion. Electronically Signed   By: Iven Finn M.D.   On: 08/16/2021 18:55   IR Paracentesis  Result Date: 08/18/2021 INDICATION: Patient with history of AKI, newly diagnosed cirrhosis, and anasarca. Request for diagnostic only paracentesis with a maximum drain of 100 mL. EXAM: ULTRASOUND GUIDED DIAGNOSTIC PARACENTESIS MEDICATIONS: 10 mL 1%  lidocaine COMPLICATIONS: None immediate. PROCEDURE: Informed written consent was obtained from the patient after a discussion of the risks, benefits and alternatives to treatment. A timeout was performed prior to the initiation of the procedure. Initial ultrasound scanning demonstrates a moderate amount of ascites within the right upper abdominal quadrant. The right upper abdomen was prepped and draped in the usual sterile fashion. 1% lidocaine was used for local anesthesia. Following this, a 6 Fr Safe-T-Centesis catheter was introduced. An ultrasound image was saved for documentation purposes. The paracentesis was performed. The catheter was removed and a dressing was applied. The patient tolerated the procedure well without immediate post procedural complication. FINDINGS: A total of approximately 100 mL of hazy, yellow fluid was removed. Samples were sent to the laboratory as requested by the clinical team. Per order maximum of 100 mL was removed from right upper quadrant even though there was a moderate amount of ascites noted on ultrasound images remaining. IMPRESSION: Successful ultrasound-guided paracentesis yielding 100 mL of peritoneal fluid. Read by: Narda Rutherford, NP Electronically Signed   By: Miachel Roux M.D.   On: 08/18/2021 11:11   IR  THORACENTESIS ASP PLEURAL SPACE W/IMG GUIDE  Result Date: 08/08/2021 INDICATION: Pt with hx of CAD, HLD, HTN, prostate cancer and edema with recent anasarca and fluid overload with pleural effusions. Request for diagnostic and therapeutic thoracentesis. EXAM: ULTRASOUND GUIDED DIAGNOSTIC AND THERAPEUTIC THORACENTESIS MEDICATIONS: 44ml 1% lidocaine COMPLICATIONS: None immediate. PROCEDURE: An ultrasound guided thoracentesis was thoroughly discussed with the patient and questions answered. The benefits, risks, alternatives and complications were also discussed. The patient understands and wishes to proceed with the procedure. Written consent was obtained. Ultrasound was performed to localize and mark an adequate pocket of fluid in the right chest. The area was then prepped and draped in the normal sterile fashion. 1% Lidocaine was used for local anesthesia. Under ultrasound guidance a 6 Fr Safe-T-Centesis catheter was introduced. Thoracentesis was performed. The catheter was removed and a dressing applied. FINDINGS: A total of approximately 2 L of clear, yellow fluid was removed. Samples were sent to the laboratory as requested by the clinical team. IMPRESSION: Successful ultrasound guided right thoracentesis yielding 2 L of pleural fluid. Electronically Signed   By: Corrie Mckusick D.O.   On: 08/08/2021 13:05    Labs:  CBC: Recent Labs    08/18/21 0301 08/19/21 0239 08/20/21 0200 08/21/21 0355  WBC 6.5 6.1 5.6 8.4  HGB 9.2* 8.5* 10.0* 9.3*  HCT 28.2* 25.8* 30.4* 28.7*  PLT 286 280 334 312    COAGS: Recent Labs    08/16/21 0404 08/21/21 0355  INR 1.0 1.0    BMP: Recent Labs    10/10/20 0810 08/14/21 2118 08/18/21 0301 08/19/21 0239 08/20/21 0729 08/20/21 1747 08/21/21 0355  NA 136   < > 135 135 136  --  137  K 4.1   < > 4.8 4.5 5.2* 5.0 4.9  CL 97   < > 104 103 104  --  104  CO2 25   < > 22 22 21*  --  24  GLUCOSE 91   < > 89 89 106*  --  85  BUN 22   < > 74* 78* 85*  --  93*   CALCIUM 9.3   < > 8.3* 8.2* 8.4*  --  8.0*  CREATININE 1.28*   < > 4.06* 3.96* 4.28*  --  4.80*  GFRNONAA 56*   < > 15* 15* 14*  --  12*  GFRAA  64  --   --   --   --   --   --    < > = values in this interval not displayed.    LIVER FUNCTION TESTS: Recent Labs    08/14/21 2118 08/16/21 0404 08/17/21 0221 08/18/21 0301 08/19/21 0239 08/20/21 0729 08/21/21 0355  BILITOT 0.4  --  0.5 0.6 0.8  --   --   AST 22  --  $R'19 15 16  'tM$ --   --   ALT 13  --  $R'12 11 10  'FO$ --   --   ALKPHOS 60  --  63 48 46  --   --   PROT 4.3*  --  4.4* 4.5* 4.3*  --   --   ALBUMIN 1.7*   < > 1.6* 2.2*  2.2* 2.2* 2.5* 2.3*   < > = values in this interval not displayed.    TUMOR MARKERS: No results for input(s): AFPTM, CEA, CA199, CHROMGRNA in the last 8760 hours.  Assessment and Plan:  Scheduled today for tunneled dialysis catheter placement in IR Scheduled tomorrow for Random renal biopsy and Bone marrow biopsy  He has been consented and seen for Tunn cath and Random renal biopsy Seen today for BM biopsy Risks and benefits of Bone marrow biopsy was discussed with the patient and/or patient's family including, but not limited to bleeding, infection, damage to adjacent structures or low yield requiring additional tests.  All of the questions were answered and there is agreement to proceed. Consent signed and in chart.   Thank you for this interesting consult.  I greatly enjoyed meeting Corey Beard and look forward to participating in their care.  A copy of this report was sent to the requesting provider on this date.  Electronically Signed: Lavonia Drafts, PA-C 08/21/2021, 10:28 AM   I spent a total of 40 Minutes    in face to face in clinical consultation, greater than 50% of which was counseling/coordinating care for Bone marrow biopsy

## 2021-08-21 NOTE — Progress Notes (Addendum)
Patient ID: Corey Beard, male   DOB: January 14, 1946, 75 y.o.   MRN: [id number] [facility] Progress Note   Assessment/ Plan:   1. Acute kidney Injury: Earlier this year with creatinine of about 1.3 with significant rise over the last few weeks.  With nephrotic range proteinuria and concerns raised for plasma cell dyscrasia with faint monoclonal band on SPEP and elevated lambda light chains versus amyloidosis.  Renal biopsy requested for today along with placement of tunneled hemodialysis catheter anticipating need for hemodialysis for volume unloading with refractoriness to diuretic therapy and for the management of azotemia. 2.  Anasarca: Secondary to worsening renal function HD nephrotic syndrome-renal biopsy pending.  Minimal contribution from newly diagnosed cirrhosis without portal hypertension. 3.  Urinary tract infection: Cultures noted and currently on ceftriaxone. 4.  Anemia of chronic illness: Without overt blood loss, will continue to follow hemoglobin and hematocrit closely to decide on need for ESA versus transfusion.  Subjective:   Denies any chest pain but continues to feel "swollen all over" with minimal improvement of this overnight.  Awaiting renal biopsy/TDC placement today.   Objective:   BP 131/61 (BP Location: Left Wrist)   Pulse 66   Temp 97.7 F (36.5 C) (Oral)   Resp 20   Ht $R'6\' 1"'LM$  (1.854 m)   Wt 117.9 kg   SpO2 96%   BMI 34.29 kg/m   Intake/Output Summary (Last 24 hours) at 08/21/2021 7628 Last data filed at 08/21/2021 0703 Gross per 24 hour  Intake 709 ml  Output 1150 ml  Net -441 ml   Weight change:   Physical Exam: Gen: Comfortably resting in bed, watching news on television CVS: Pulse regular rhythm, normal rate, S1 and S2 normal Resp: Decreased breath sounds over bilateral bases, no distinct rhonchi or rales Abd: Soft, obese, nontender Ext: 3+ anasarca with areas of skin denudation left and right arms  Imaging: DG Bone Survey  Met  Result Date: 08/19/2021 CLINICAL DATA:  Metastatic bone survey. EXAM: METASTATIC BONE SURVEY COMPARISON:  MRI of the abdomen August 17, 2021 FINDINGS: Skull: Normal bony mineralization. No focal lytic or blastic osseous lesion. Cervical Spine: Normal bony mineralization. No focal lytic or blastic osseous lesion. Thoracic Spine: Normal bony mineralization. No focal lytic or blastic osseous lesion. Chest: Normal bony mineralization. No focal lytic or blastic osseous lesion. Lumbar Spine: Normal bony mineralization. No focal lytic or blastic osseous lesion. Pelvis: Normal bony mineralization. No focal lytic or blastic osseous lesion. Right Upper Extremity: Normal bony mineralization. No focal lytic or blastic osseous lesion. Left Upper Extremity: Normal bony mineralization. No focal lytic or blastic osseous lesion. Right Lower Extremity: Normal bony mineralization. No focal lytic or blastic osseous lesion. Left Lower Extremity: Normal bony mineralization. No focal lytic or blastic osseous lesion. Additional Bilateral pleural effusions. Osteoarthritic changes of the bilateral shoulders. Calcific atherosclerotic disease of the carotid arteries, aorta and femoral arteries. IMPRESSION: No lytic or sclerotic osseous lesions seen throughout the skeletal survey. Electronically Signed   By: Fidela Salisbury M.D.   On: 08/19/2021 14:07    Labs: BMET Recent Labs  Lab 08/15/21 0243 08/16/21 0404 08/17/21 0221 08/18/21 0301 08/19/21 0239 08/20/21 0729 08/20/21 1747 08/21/21 0355  NA 135 136 135 135 135 136  --  137  K 4.7 4.7 5.0 4.8 4.5 5.2* 5.0 4.9  CL 105 106 105 104 103 104  --  104  CO2 23 22 21* 22 22 21*  --  24  GLUCOSE 88 82 92  89 89 106*  --  85  BUN 61* 65* 68* 74* 78* 85*  --  93*  CREATININE 3.17* 3.56* 3.77* 4.06* 3.96* 4.28*  --  4.80*  CALCIUM 7.6* 7.9*  7.9* 8.1* 8.3* 8.2* 8.4*  --  8.0*  PHOS  --  4.9*  --   --   --  7.0*  --  7.2*   CBC Recent Labs  Lab 08/18/21 0301  08/19/21 0239 08/20/21 0200 08/21/21 0355  WBC 6.5 6.1 5.6 8.4  HGB 9.2* 8.5* 10.0* 9.3*  HCT 28.2* 25.8* 30.4* 28.7*  MCV 87.0 88.1 86.9 88.0  PLT 286 280 334 312    Medications:     atorvastatin  40 mg Oral Daily   ezetimibe  10 mg Oral QPC supper   heparin  5,000 Units Subcutaneous Q8H   hydrALAZINE  25 mg Oral Q8H   isosorbide mononitrate  60 mg Oral Daily   metolazone  10 mg Oral Daily   sodium chloride flush  3 mL Intravenous Q12H   tamsulosin  0.4 mg Oral Daily   Elmarie Shiley, MD 08/21/2021, 8:52 AM

## 2021-08-21 NOTE — Progress Notes (Signed)
PT Cancellation Note  Patient Details Name: MANUELITO POAGE MRN: [id number] DOB: [birth year]   Cancelled Treatment:    Reason Eval/Treat Not Completed: Patient at procedure or test/unavailable. Patient/RN report patient going down to radiology. Will re-attempt when he returns.     Sebastyan Snodgrass 08/21/2021, 10:49 AM

## 2021-08-21 NOTE — Procedures (Signed)
Pre-procedure Diagnosis: ESRD Post-procedure Diagnosis: Same  Successful placement of tunneled HD catheter with tips terminating within the superior aspect of the right atrium.    Complications: None Immediate  EBL: Minimal   The catheter is ready for immediate use.   Jay Oree Mirelez, MD Pager #: [phone]   

## 2021-08-21 NOTE — Progress Notes (Signed)
PT Cancellation Note  Patient Details Name: ALEXXANDER KURT MRN: [id number] DOB: [birth year]   Cancelled Treatment:    Reason Eval/Treat Not Completed: Patient declined, no reason specified. Re-attempted this pm. Patient declining PT at this time despite encouragement. Will continue to follow and see as appropriate.    Dacoda Finlay 08/21/2021, 2:31 PM

## 2021-08-22 ENCOUNTER — Other Ambulatory Visit (HOSPITAL_COMMUNITY): Payer: Self-pay

## 2021-08-22 ENCOUNTER — Inpatient Hospital Stay (HOSPITAL_COMMUNITY): Payer: Medicare PPO

## 2021-08-22 DIAGNOSIS — N049 Nephrotic syndrome with unspecified morphologic changes: Secondary | ICD-10-CM | POA: Diagnosis not present

## 2021-08-22 DIAGNOSIS — K7460 Unspecified cirrhosis of liver: Secondary | ICD-10-CM | POA: Diagnosis not present

## 2021-08-22 DIAGNOSIS — N179 Acute kidney failure, unspecified: Secondary | ICD-10-CM | POA: Diagnosis not present

## 2021-08-22 DIAGNOSIS — D472 Monoclonal gammopathy: Secondary | ICD-10-CM | POA: Diagnosis not present

## 2021-08-22 LAB — MITOCHONDRIAL ANTIBODIES: Mitochondrial M2 Ab, IgG: 32 Units — ABNORMAL HIGH (ref 0.0–20.0)

## 2021-08-22 LAB — BASIC METABOLIC PANEL
Anion gap: 9 (ref 5–15)
BUN: 100 mg/dL — ABNORMAL HIGH (ref 8–23)
CO2: 24 mmol/L (ref 22–32)
Calcium: 7.8 mg/dL — ABNORMAL LOW (ref 8.9–10.3)
Chloride: 103 mmol/L (ref 98–111)
Creatinine, Ser: 4.82 mg/dL — ABNORMAL HIGH (ref 0.61–1.24)
GFR, Estimated: 12 mL/min — ABNORMAL LOW (ref >60)
Glucose, Bld: 86 mg/dL (ref 70–99)
Potassium: 4.6 mmol/L (ref 3.5–5.1)
Sodium: 136 mmol/L (ref 135–145)

## 2021-08-22 LAB — UPEP/UIFE/LIGHT CHAINS/TP, 24-HR UR
% BETA, Urine: 7.9 %
ALPHA 1 URINE: 0.9 %
Albumin, U: 77.2 %
Alpha 2, Urine: 2.3 %
Free Kappa Lt Chains,Ur: 26.42 mg/L (ref 1.17–86.46)
Free Kappa/Lambda Ratio: 0.03 — ABNORMAL LOW (ref 1.83–14.26)
Free Lambda Lt Chains,Ur: 990.75 mg/L — ABNORMAL HIGH (ref 0.27–15.21)
GAMMA GLOBULIN URINE: 11.6 %
M-SPIKE %, Urine: 7.1 % — ABNORMAL HIGH
M-Spike, Mg/24 Hr: 84 mg/24 hr — ABNORMAL HIGH
Total Protein, Urine-Ur/day: 1189 mg/24 hr — ABNORMAL HIGH (ref 30–150)
Total Protein, Urine: 237.7 mg/dL
Total Volume: 500

## 2021-08-22 LAB — HEPATITIS B SURFACE ANTIBODY,QUALITATIVE: Hep B S Ab: NONREACTIVE

## 2021-08-22 LAB — HEPATITIS B SURFACE ANTIGEN: Hepatitis B Surface Ag: NONREACTIVE

## 2021-08-22 MED ORDER — ALTEPLASE 2 MG IJ SOLR
2.0000 mg | Freq: Once | INTRAMUSCULAR | Status: DC | PRN
  Filled 2021-08-22: qty 2

## 2021-08-22 MED ORDER — LIDOCAINE HCL (PF) 1 % IJ SOLN: 5.0000 mL | INTRAMUSCULAR | Status: DC | PRN

## 2021-08-22 MED ORDER — SODIUM CHLORIDE 0.9 % IV SOLN: 100.0000 mL | INTRAVENOUS | Status: DC | PRN

## 2021-08-22 MED ORDER — FENTANYL CITRATE (PF) 100 MCG/2ML IJ SOLN
INTRAMUSCULAR | Status: AC
  Filled 2021-08-22: qty 4

## 2021-08-22 MED ORDER — LIDOCAINE-PRILOCAINE 2.5-2.5 % EX CREA
1.0000 "application " | TOPICAL_CREAM | CUTANEOUS | Status: DC | PRN
  Filled 2021-08-22: qty 5

## 2021-08-22 MED ORDER — HEPARIN SODIUM (PORCINE) 1000 UNIT/ML DIALYSIS
1000.0000 [IU] | INTRAMUSCULAR | Status: DC | PRN
  Filled 2021-08-22 (×2): qty 1

## 2021-08-22 MED ORDER — GELATIN ABSORBABLE 12-7 MM EX MISC
CUTANEOUS | Status: AC
  Filled 2021-08-22: qty 1

## 2021-08-22 MED ORDER — LIDOCAINE HCL 1 % IJ SOLN
INTRAMUSCULAR | Status: AC
  Filled 2021-08-22: qty 10

## 2021-08-22 MED ORDER — PENTAFLUOROPROP-TETRAFLUOROETH EX AERO: 1.0000 "application " | INHALATION_SPRAY | CUTANEOUS | Status: DC | PRN

## 2021-08-22 MED ORDER — MIDAZOLAM HCL 2 MG/2ML IJ SOLN
INTRAMUSCULAR | Status: AC
  Filled 2021-08-22: qty 4

## 2021-08-22 MED ORDER — FENTANYL CITRATE (PF) 100 MCG/2ML IJ SOLN
INTRAMUSCULAR | Status: DC | PRN
  Administered 2021-08-22: 25 ug via INTRAVENOUS

## 2021-08-22 MED ORDER — MIDAZOLAM HCL 2 MG/2ML IJ SOLN
INTRAMUSCULAR | Status: DC | PRN
  Administered 2021-08-22: .5 mg via INTRAVENOUS

## 2021-08-22 NOTE — Consult Note (Signed)
   [facility] Inpatient Consult   08/22/2021  SABEN DONIGAN 1946-08-17 [id number]  [place] Organization [ACO] Patient: [place] Medicare  Primary Care Provider:  Benito Mccreedy, MD, verified by patient.  This provider is listed for the [facility] follow up appointments and call.  Patient evaluated for community based chronic complex disease management services with [place] [facility] as a benefit of patient's [employer]. Spoke with patient at the bedside, daughter in attendance,  HIPAA verified to explain [place] Management services.   Explained that the patient qualifies for complex disease management as a benefit with primary care provider and possible insurance benefits.  A [facility] Care Management package given and patient agrees to post hospital follow up.   Patient will receive post hospital discharge call and will be evaluated for complex disease management.      Of note, [facility] Care Management services does not replace or interfere with any services that are arranged by inpatient case management or social work.  For additional questions or referrals please contact:    Natividad Brood, RN BSN [facility] Liaison  [phone] business mobile phone Toll free office [phone]  Fax number: [phone] [email or web address]

## 2021-08-22 NOTE — TOC Initial Note (Signed)
Transition of Care [facility]) - Initial/Assessment Note    Patient Details  Name: Corey Beard MRN: [id number] Date of Birth: Apr 22, 1946  [facility] CM/SW Contact:    Corey Roys, RN Phone Number: 08/22/2021, 3:09 PM  Clinical Narrative:   Case Manager spoke with patient and his daughter Corey Beard [phone] at the bedside regarding home health needs. 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 Physical Therapy recommendations are for home with [facility] PT/OT. Case Manager offered choice and the daughter chose Corey Beard for [facility]. Referral provided to [facility]. Patient will need to continue working on the steps for home with physical therapy. Wife is on the lower level in a hospital bed and the patient would have to go up and down 17 steps to get to her. Patients daughter had questions regarding a hospital bed for the patient. Patient states he was pretty independent prior to hospitalization. Please call the daughter with any needs for home. Patient will need HH PT/OT orders and F2F once stable. Case Manager will continue to monitor the patient and his durable medical equipment needs.                 Expected Discharge Plan: [place] Barriers to Discharge: Continued Medical Work up   Patient Goals and CMS Choice   CMS Medicare.gov Compare Post Acute Care list provided to:: Patient Choice offered to / list presented to : Adult Children, Patient  Expected Discharge Plan and Services Expected Discharge Plan: [place] In-house Referral: NA Discharge Planning Services: CM Consult Post Acute Care Choice: Home Health Living arrangements for the past 2 months: Single Family Home                 DME Arranged:  (following for DME needs- daughter asked about a hospital bed.)         HH Arranged: OT, PT HH Agency: [place] Date [place]: 08/22/21 Time [place]: 1250 Representative spoke with at [place]: Corey Beard  Prior Living Arrangements/Services Living arrangements for the past 2 months: [place] with:: Self, Spouse (Patient is the caregiver for his spouse.) Patient language and need for interpreter reviewed:: Yes        Need for Family Participation in Patient Care: Yes (Comment) Care giver support system in place?: Yes (comment)   Criminal Activity/Legal Involvement Pertinent to Current Situation/Hospitalization: No - Comment as needed  Activities of Daily Living   ADL Screening (condition at time of admission) Patient's cognitive ability adequate to safely complete daily activities?: Yes Is the patient deaf or have difficulty hearing?: No Does the patient have difficulty seeing, even when wearing glasses/contacts?: No Does the patient have difficulty concentrating, remembering, or making decisions?: No Patient able to express need for assistance with ADLs?: Yes Does the patient have difficulty dressing or bathing?: No Independently performs ADLs?: Yes (appropriate for developmental age) Does the patient have difficulty walking or climbing stairs?: No Weakness of Legs: Both Weakness of Arms/Hands: None  Permission Sought/Granted Permission sought to share information with : Case Manager, Customer service manager, Family Supports Permission granted to share information with : Yes, Verbal Permission Granted  Share Information with NAME: Daughter: Corey Beard [phone]  Permission granted to share info w AGENCY: Corey Beard        Emotional Assessment Appearance:: Appears stated age Attitude/Demeanor/Rapport: Engaged Affect (typically observed): Appropriate Orientation: : Oriented  to Situation, Oriented to  Time, Oriented to Place, Oriented to Self Alcohol / Substance Use: Not Applicable Psych Involvement: No (comment)  Admission diagnosis:  Anasarca [R60.1] Patient Active Problem List    Diagnosis Date Noted   Dyslipidemia 08/15/2021   Nephrotic syndrome 08/14/2021   Coronary artery disease 03/13/2019   Essential hypertension 03/13/2019   Type 2 diabetes mellitus ([place]) 03/13/2019   Aortic root dilatation (HCC) 03/13/2019   Angina pectoris ([place]) 07/22/2017   Malignant neoplasm of prostate ([place]) 07/06/2015   PCP:  Benito Mccreedy, MD Pharmacy:   [place], [place] - [address] [place] [address] Phone: [phone] Fax: [phone]    Readmission Risk Interventions No flowsheet data found.

## 2021-08-22 NOTE — Procedures (Signed)
Interventional Radiology Procedure Note  Procedure: CT BM ASP AND CORE BX  [place] LEFT RENAL RANDOM CORE BX    Complications: None  Estimated Blood Loss:  MIN  Findings: 11 G BONE BM ASP AND CORE  16 G LEFT RENAL RANDOM CORE BX X 2    M. Daryll Brod, MD

## 2021-08-22 NOTE — Progress Notes (Signed)
Report given to 3 [place] RN post CT biopsy procedure. RN informed that patient is on 3L nasal canula with oxygen saturations of 94%. Patient alert/oriented x4, vital signs stable, band aids on biopsy sites clean/dry/intact.

## 2021-08-22 NOTE — Progress Notes (Signed)
PROGRESS NOTE    Corey Beard  XNT:[id number] DOB: [birth year] DOA: 08/14/2021 PCP: Benito Mccreedy, MD    No chief complaint on file.   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 He is found to have bilateral pleural effusions R>L, with recent 2 L thoracentesis performed on 08/07/2021. Cr  on admission up at 3.3 from baseline of 1.2-1.3 a year ago. He has 3+ proteinuria. He was initially admitted by cardiology, transferred to [facility].  Nephrology consulted for evaluation of nephrotic syndrome.  He underwent successful placement of tunneled HD catheter on 08/21/2021. He underwent bone marrow biopsy and renal biopsy today and scheduled for HD today.   Assessment & Plan:   Principal Problem:   Nephrotic syndrome Active Problems:   Malignant neoplasm of prostate (HCC)   Coronary artery disease   Essential hypertension   Dyslipidemia   Anasarca with bilateral pleural effusions, proteinuria, AKI,  ascites and hypoalbuminemia: - differential include  nephrotic syndrome vs liver cirrhosis vs amyloidosis - echocardiogram ruled out decompensated CHF.  - US renal Normal-appearing kidneys with the exception of a left lower pole renal cyst. - pt underwent HD catheter placement on 08/21/21 and scheduled for HD today as his renal parameters continue to worsen.  - He is scheduled for renal biopsy and BM biopsy tomorrow.  - diuresis with IV lasix 160 mg 3 times daily and metolazone.  - protein electrophoresis show monoclonal band, UPEP is pending. increased kappa and lambda light chains 79/2640.  Concern for amyloidosis. Complements are wnl. PTH is intact. Rpr is non reactive.  - Nephrology on board and appreciate recommendations.  - heme onc consulted for evaluation for MM. Skeletal survey is negative for lytic or  sclerotic osseous lesions throughout the skeletal survey. - US liver reveals cirrhosis with out any signs of portal hypertension.  - Meanwhile GI consulted for new diagnosis of cirrhosis, recommended if renal biopsy not diagnostic, recommended liver biopsy with HVPG measurements .    AKI:  ? Nephrotic syndrome vs HRS vs amyloidosis.  Creatinine at baseline around 1.2 to 1.3, admitted with a  creatinine around 3.3 <3.56 <3.77. <4 >3.9<4.28 <4.82 Currently on IV lasix 160 mg TID and metolazone.     Hypertension:  Optimal BP parameters.  - on hydralazine, imdur and lasix.    Hyperlipidemia Resume lipitor and zetia.   CAD;  No chest pain  On aspirin held.    Abnormal TSH.  TSH is 7.659, free T3 is 1.3 and free t4 wnl. Recommend checking thyroid panel in 4 weeks.    BPH:  On flomax.   Abnormal UA with urine culture showing multiple species. Repeat cultures are ordered.  Completed the course of IV rocephin.   Bilateral upper and lower extremity edema from the anasarca with weeping  - IV albumin infusions ordered.    Ascites  US paracentesis ordered and fluid analysis sent.   Body mass index is 35.25 kg/m. Obesity:  Fluid overloaded.  Continue to monitor.   Hyperkalemia:  Resolved.    Anemia of chronic disease:  Hemoglobin around 10.  Iron stores are low.  He received IV iron infusion.     DVT prophylaxis: heparin.  Code Status: Partial code.  Family Communication: none at bedside.  Disposition:   Status is: Inpatient  Remains inpatient appropriate because:Ongoing diagnostic testing needed not appropriate for outpatient work up,  Unsafe d/c plan, and IV treatments appropriate due to intensity of illness or inability to take PO  Dispo: The patient is from: Home              Anticipated d/c is to:  pending.              Patient currently is not medically stable to d/c.   Difficult to place patient No       Consultants:  Nephrology.   Gastroenterology  Hematology oncology.   Procedures:  US liver.  US RENAL.  ECHO. Severe concentric hypertrophy of  the left ventricle. Normal global wall motion. Normal LV systolic function  with EF 58%. Doppler evidence of grade I (impaired) diastolic dysfunction,  normal LAP. Left atrial cavity is mildly dilated.   Antimicrobials:  Antibiotics Given (last 72 hours)     Date/Time Action Medication Dose Rate   08/20/21 1139 New Bag/Given   cefTRIAXone (ROCEPHIN) 1 g in sodium chloride 0.9 % 100 mL IVPB 1 g 200 mL/hr   08/21/21 0813 New Bag/Given   cefTRIAXone (ROCEPHIN) 1 g in sodium chloride 0.9 % 100 mL IVPB 1 g 200 mL/hr         Subjective: No new complaints.  Pt reports not feeling great.   Objective: Vitals:   08/22/21 1533 08/22/21 1534 08/22/21 1600 08/22/21 1630  BP: (!) 192/62 (!) 162/56 (!) 147/57 (!) 148/63  Pulse: 66 63    Resp:      Temp:      TempSrc:      SpO2: 99% 98%    Weight:      Height:        Intake/Output Summary (Last 24 hours) at 08/22/2021 1643 Last data filed at 08/22/2021 0804 Gross per 24 hour  Intake 240 ml  Output 1026 ml  Net -786 ml    Filed Weights   08/19/21 0441 08/21/21 0222 08/22/21 1521  Weight: 116 kg 117.9 kg 121.2 kg    Examination:  General exam: Obese elderly gentleman, on 2 lit of [place] oxygen.  Respiratory system: Diminished air entry at bases, on 2lit of [place] oxygen.  Cardiovascular system: S1 & S2 heard, RRR. JVD cannot be appreciated, edematous upper and lower extremities.  Gastrointestinal system: Abdomen is soft, distended, bowel sounds wnl. Normal bowel sounds heard. Central nervous system: Alert and oriented. No focal neurological deficits. Extremities: Edematous upper and the lower extremities.  Skin: weeping upper extremities.  Psychiatry: mood is appropriate.      Data Reviewed: I have personally reviewed following labs and imaging studies  CBC: Recent Labs  Lab 08/16/21 0404 08/18/21 0301  08/19/21 0239 08/20/21 0200 08/21/21 0355  WBC  --  6.5 6.1 5.6 8.4  HGB 8.9* 9.2* 8.5* 10.0* 9.3*  HCT  --  28.2* 25.8* 30.4* 28.7*  MCV  --  87.0 88.1 86.9 88.0  PLT  --  286 280 334 312     Basic Metabolic Panel: Recent Labs  Lab 08/16/21 0404 08/17/21 0221 08/18/21 0301 08/19/21 0239 08/20/21 0729 08/20/21 1747 08/21/21 0355 08/22/21 0248  NA 136   < > 135 135 136  --  137 136  K 4.7   < > 4.8 4.5 5.2* 5.0 4.9 4.6  CL 106   < > 104 103 104  --  104 103  CO2 22   < > 22 22 21*  --  24 24  GLUCOSE 82   < > 89 89 106*  --  85  86  BUN 65*   < > 74* 78* 85*  --  93* 100*  CREATININE 3.56*   < > 4.06* 3.96* 4.28*  --  4.80* 4.82*  CALCIUM 7.9*  7.9*   < > 8.3* 8.2* 8.4*  --  8.0* 7.8*  PHOS 4.9*  --   --   --  7.0*  --  7.2*  --    < > = values in this interval not displayed.     GFR: Estimated Creatinine Clearance: 18.6 mL/min (A) (by C-G formula based on SCr of 4.82 mg/dL (H)).  Liver Function Tests: Recent Labs  Lab 08/17/21 0221 08/18/21 0301 08/19/21 0239 08/20/21 0729 08/21/21 0355  AST $Re'19 15 16  'Erf$ --   --   ALT $Re'12 11 10  'cjs$ --   --   ALKPHOS 63 48 46  --   --   BILITOT 0.5 0.6 0.8  --   --   PROT 4.4* 4.5* 4.3*  --   --   ALBUMIN 1.6* 2.2*  2.2* 2.2* 2.5* 2.3*     CBG: Recent Labs  Lab 08/15/21 2142 08/16/21 0550  GLUCAP 332* 85      Recent Results (from the past 240 hour(s))  Resp Panel by RT-PCR (Flu A&B, Covid) Nasopharyngeal Swab     Status: None   Collection Time: 08/14/21  7:57 PM   Specimen: Nasopharyngeal Swab; Nasopharyngeal(NP) swabs in vial transport medium  Result Value Ref Range Status   SARS Coronavirus 2 by RT PCR NEGATIVE NEGATIVE Final    Comment: (NOTE) SARS-CoV-2 target nucleic acids are NOT DETECTED.  The SARS-CoV-2 RNA is generally detectable in upper respiratory specimens during the acute phase of infection. The lowest concentration of SARS-CoV-2 viral copies this assay can detect is 138 copies/mL. A negative result  does not preclude SARS-Cov-2 infection and should not be used as the sole basis for treatment or other patient management decisions. A negative result may occur with  improper specimen collection/handling, submission of specimen other than nasopharyngeal swab, presence of viral mutation(s) within the areas targeted by this assay, and inadequate number of viral copies(<138 copies/mL). A negative result must be combined with clinical observations, patient history, and epidemiological information. The expected result is Negative.  Fact Sheet for Patients:  EntrepreneurPulse.com.au  Fact Sheet for Healthcare Providers:  IncredibleEmployment.be  This test is no t yet approved or cleared by the [place] FDA and  has been authorized for detection and/or diagnosis of SARS-CoV-2 by FDA under an Emergency Use Authorization (EUA). This EUA will remain  in effect (meaning this test can be used) for the duration of the COVID-19 declaration under Section 564(b)(1) of the Act, 21 U.S.C.section 360bbb-3(b)(1), unless the authorization is terminated  or revoked sooner.       Influenza A by PCR NEGATIVE NEGATIVE Final   Influenza B by PCR NEGATIVE NEGATIVE Final    Comment: (NOTE) The Xpert Xpress SARS-CoV-2/FLU/RSV plus assay is intended as an aid in the diagnosis of influenza from Nasopharyngeal swab specimens and should not be used as a sole basis for treatment. Nasal washings and aspirates are unacceptable for Xpert Xpress SARS-CoV-2/FLU/RSV testing.  Fact Sheet for Patients: EntrepreneurPulse.com.au  Fact Sheet for Healthcare Providers: IncredibleEmployment.be  This test is not yet approved or cleared by the [place] FDA and has been authorized for detection and/or diagnosis of SARS-CoV-2 by FDA under an Emergency Use Authorization (EUA). This EUA will remain in effect (meaning this test can be used) for  the duration of the COVID-19 declaration under Section 564(b)(1) of the Act, 21 U.S.C. section 360bbb-3(b)(1), unless the authorization is terminated or revoked.  Performed at [facility], [place] [address], [place], [place] [address]   Urine Culture     Status: Abnormal   Collection Time: 08/15/21  7:56 AM   Specimen: Urine, Clean Catch  Result Value Ref Range Status   Specimen Description URINE, CLEAN CATCH  Final   Special Requests   Final    Normal Performed at [facility], [place] [address], [place], [place] [address]    Culture MULTIPLE SPECIES PRESENT, SUGGEST RECOLLECTION (A)  Final   Report Status 08/16/2021 FINAL  Final  Urine Culture     Status: Abnormal   Collection Time: 08/17/21  5:24 AM   Specimen: Urine, Clean Catch  Result Value Ref Range Status   Specimen Description URINE, CLEAN CATCH  Final   Special Requests   Final    NONE Performed at [facility], [place] [address], [place], [place] [address]    Culture MULTIPLE SPECIES PRESENT, SUGGEST RECOLLECTION (A)  Final   Report Status 08/18/2021 FINAL  Final  Gram stain     Status: None   Collection Time: 08/18/21  9:27 AM   Specimen: Abdomen; Peritoneal Fluid  Result Value Ref Range Status   Specimen Description PERITONEAL FLUID  Final   Special Requests NONE  Final   Gram Stain   Final    WBC PRESENT, PREDOMINANTLY MONONUCLEAR NO ORGANISMS SEEN CYTOSPIN SMEAR Performed at [facility], [place] [address] [place], [place] [address]    Report Status 08/18/2021 FINAL  Final  Culture, body fluid w Gram Stain-bottle     Status: None (Preliminary result)   Collection Time: 08/18/21  9:27 AM   Specimen: Peritoneal Washings  Result Value Ref Range Status   Specimen Description PERITONEAL FLUID  Final   Special Requests NONE  Final   Culture   Final    NO GROWTH 4 DAYS Performed at [place] [address], [place], [place] [address]    Report Status PENDING  Incomplete           Radiology Studies: IR Fluoro Guide CV Line Right  Result Date: 08/21/2021 INDICATION: End-stage renal disease. In need durable intravenous access for the initiation hemodialysis. EXAM: TUNNELED CENTRAL VENOUS HEMODIALYSIS CATHETER PLACEMENT WITH ULTRASOUND AND FLUOROSCOPIC GUIDANCE MEDICATIONS: The patient is currently admitted to the hospital receiving intravenous antibiotics. The antibiotic was given in an appropriate time interval prior to skin puncture. ANESTHESIA/SEDATION: Moderate (conscious) sedation was employed during this procedure. A total of Versed 0.5 mg and Fentanyl 25 mcg was administered intravenously. Moderate Sedation Time: 15 minutes. The patient's level of consciousness and vital signs were monitored continuously by radiology nursing throughout the procedure under my direct supervision. FLUOROSCOPY TIME:  36 seconds (14 mGy) COMPLICATIONS: None immediate. PROCEDURE: Informed written consent was obtained from the patient after a discussion of the risks, benefits, and alternatives to treatment. Questions regarding the procedure were encouraged and answered. The right neck and chest were prepped with chlorhexidine in a sterile fashion, and a sterile drape was applied covering the operative field. Maximum barrier sterile technique with sterile gowns and gloves were used for the procedure. A timeout was performed prior to the initiation of the procedure. After creating a small venotomy incision, a micropuncture kit was utilized to access the internal jugular vein. Real-time ultrasound guidance was utilized for vascular  access including the acquisition of a permanent ultrasound image documenting patency of the accessed vessel. The microwire was utilized to measure appropriate catheter length. A stiff Glidewire was advanced to the level of the IVC and the micropuncture sheath was exchanged for a peel-away sheath. A palindrome tunneled hemodialysis catheter measuring 19 cm from tip to  cuff was tunneled in a retrograde fashion from the anterior chest wall to the venotomy incision. The catheter was then placed through the peel-away sheath with tips ultimately positioned within the superior aspect of the right atrium. Final catheter positioning was confirmed and documented with a spot radiographic image. The catheter aspirates and flushes normally. The catheter was flushed with appropriate volume heparin dwells. The catheter exit site was secured with a 0-Prolene retention suture. The venotomy incision was closed with an interrupted 4-0 Vicryl, Dermabond and Steri-strips. Dressings were applied. The patient tolerated the procedure well without immediate post procedural complication. IMPRESSION: Successful placement of 19 cm tip to cuff tunneled hemodialysis catheter via the right internal jugular vein with tips terminating within the superior aspect of the right atrium. The catheter is ready for immediate use. Electronically Signed   By: Sandi Mariscal M.D.   On: 08/21/2021 14:57   IR US Guide Vasc Access Right  INDICATION: End-stage renal disease. In need durable intravenous access for the initiation hemodialysis.   EXAM: TUNNELED CENTRAL VENOUS HEMODIALYSIS CATHETER PLACEMENT WITH ULTRASOUND AND FLUOROSCOPIC GUIDANCE   MEDICATIONS: The patient is currently admitted to the hospital receiving intravenous antibiotics. The antibiotic was given in an appropriate time interval prior to skin puncture.   ANESTHESIA/SEDATION: Moderate (conscious) sedation was employed during this procedure. A total of Versed 0.5 mg and Fentanyl 25 mcg was administered intravenously.   Moderate Sedation Time: 15 minutes. The patient's level of consciousness and vital signs were monitored continuously by radiology nursing throughout the procedure under my direct supervision.   FLUOROSCOPY TIME:  36 seconds (14 mGy)   COMPLICATIONS: None immediate.   PROCEDURE: Informed written consent was obtained from the patient after a  discussion of the risks, benefits, and alternatives to treatment. Questions regarding the procedure were encouraged and answered. The right neck and chest were prepped with chlorhexidine in a sterile fashion, and a sterile drape was applied covering the operative field. Maximum barrier sterile technique with sterile gowns and gloves were used for the procedure. A timeout was performed prior to the initiation of the procedure.   After creating a small venotomy incision, a micropuncture kit was utilized to access the internal jugular vein. Real-time ultrasound guidance was utilized for vascular access including the acquisition of a permanent ultrasound image documenting patency of the accessed vessel. The microwire was utilized to measure appropriate catheter length.   A stiff Glidewire was advanced to the level of the IVC and the micropuncture sheath was exchanged for a peel-away sheath. A palindrome tunneled hemodialysis catheter measuring 19 cm from tip to cuff was tunneled in a retrograde fashion from the anterior chest wall to the venotomy incision.   The catheter was then placed through the peel-away sheath with tips ultimately positioned within the superior aspect of the right atrium. Final catheter positioning was confirmed and documented with a spot radiographic image. The catheter aspirates and flushes normally. The catheter was flushed with appropriate volume heparin dwells.   The catheter exit site was secured with a 0-Prolene retention suture. The venotomy incision was closed with an interrupted 4-0 Vicryl, Dermabond and Steri-strips. Dressings were applied. The patient tolerated the  procedure well without immediate post procedural complication.   IMPRESSION: Successful placement of 19 cm tip to cuff tunneled hemodialysis catheter via the right internal jugular vein with tips terminating within the superior aspect of the right atrium. The catheter is ready for immediate use.     Electronically Signed    By: Sandi Mariscal M.D.   On: 08/21/2021 14:57  CT BONE MARROW BIOPSY & ASPIRATION  Result Date: 08/22/2021 INDICATION: Monoclonal gammopathy of unknown significance, amyloidosis EXAM: CT GUIDED LEFT ILIAC BONE MARROW ASPIRATION AND CORE BIOPSY Date:  08/22/2021 08/22/2021 10:32 am Radiologist:  M. Daryll Brod, MD Guidance:  CT FLUOROSCOPY TIME:  Fluoroscopy Time: None. MEDICATIONS: 1% lidocaine local ANESTHESIA/SEDATION: 1.0 mg IV Versed; 80 mcg IV Fentanyl Moderate Sedation Time:  27 minute The patient was continuously monitored during the procedure by the interventional radiology nurse under my direct supervision. CONTRAST:  None. COMPLICATIONS: None PROCEDURE: Informed consent was obtained from the patient following explanation of the procedure, risks, benefits and alternatives. The patient understands, agrees and consents for the procedure. All questions were addressed. A time out was performed. The patient was positioned prone and non-contrast localization CT was performed of the pelvis to demonstrate the iliac marrow spaces. Maximal barrier sterile technique utilized including caps, mask, sterile gowns, sterile gloves, large sterile drape, hand hygiene, and Betadine prep. Under sterile conditions and local anesthesia, an 11 gauge coaxial bone biopsy needle was advanced into the left iliac marrow space. Needle position was confirmed with CT imaging. Initially, bone marrow aspiration was performed. Next, the 11 gauge outer cannula was utilized to obtain a left iliac bone marrow core biopsy. Needle was removed. Hemostasis was obtained with compression. The patient tolerated the procedure well. Samples were prepared with the cytotechnologist. No immediate complications. IMPRESSION: CT guided left iliac bone marrow aspiration and core biopsy. Electronically Signed   By: Jerilynn Mages.  Shick M.D.   On: 08/22/2021 10:35   CT RENAL BIOPSY  Result Date: 08/22/2021 INDICATION: Renal failure, nephrotic syndrome, concern  for amyloidosis EXAM: ULTRASOUND LEFT RENAL RANDOM CORE BIOPSY MEDICATIONS: 1% LIDOCAINE LOCAL ANESTHESIA/SEDATION: Moderate (conscious) sedation was employed during this procedure. A total of Versed 1 mg and Fentanyl 80 mcg was administered intravenously. Moderate Sedation Time: 27 minutes. The patient's level of consciousness and vital signs were monitored continuously by radiology nursing throughout the procedure under my direct supervision. FLUOROSCOPY TIME:  Fluoroscopy Time: NONE. COMPLICATIONS: None immediate. PROCEDURE: Informed written consent was obtained from the patient after a thorough discussion of the procedural risks, benefits and alternatives. All questions were addressed. Maximal Sterile Barrier Technique was utilized including caps, mask, sterile gowns, sterile gloves, sterile drape, hand hygiene and skin antiseptic. A timeout was performed prior to the initiation of the procedure. Patient position prone. Previous imaging reviewed. Left kidney lower pole was localized and marked. Under sterile conditions and local anesthesia, a 15 gauge coaxial guide needle was advanced to the left kidney lower pole cortex. Needle position confirmed with ultrasound. Images obtained for documentation. 2 16 gauge core biopsies obtained through the access under direct ultrasound of the lower pole cortex. Samples were intact and non fragmented. These were placed in saline. Needle tract occluded with Gel-Foam. Postprocedure imaging demonstrates no hemorrhage or hematoma. Patient tolerated biopsy well. IMPRESSION: Successful ultrasound left kidney core biopsy Electronically Signed   By: Jerilynn Mages.  Shick M.D.   On: 08/22/2021 10:38        Scheduled Meds:  atorvastatin  40 mg Oral Daily   Chlorhexidine Gluconate Cloth  6 each Topical Daily   ezetimibe  10 mg Oral QPC supper   gelatin adsorbable       hydrALAZINE  25 mg Oral Q8H   isosorbide mononitrate  60 mg Oral Daily   lidocaine       lidocaine       metolazone   10 mg Oral Daily   sodium chloride flush  3 mL Intravenous Q12H   tamsulosin  0.4 mg Oral Daily   Continuous Infusions:  sodium chloride Stopped (08/19/21 1233)   sodium chloride 10 mL/hr at 08/18/21 2046   sodium chloride     sodium chloride     furosemide 160 mg (08/22/21 1235)     LOS: 8 days        Hosie Poisson, MD [facility]   To contact the attending provider between 7A-7P or the covering provider during after hours 7P-7A, please log into the web site [email or web address] and access using universal [place] password for that web site. If you do not have the password, please call the hospital operator.  08/22/2021, 4:43 PM

## 2021-08-22 NOTE — Progress Notes (Signed)
Patient ID: Corey Beard, male   DOB: February 20, 1946, 75 y.o.   MRN: [id number] [facility] Progress Note   Assessment/ Plan:   1. Acute kidney Injury: Earlier this year with creatinine of about 1.3 with significant rise over the last few weeks.  Significant proteinuria with M spike noted on serum protein electrophoresis along with elevated lambda light chains-suspicion raised for multiple myeloma versus amyloidosis for which he underwent renal biopsy along with bone marrow biopsy earlier today by IR.  Given his continued azotemia and anasarca with poor response to diuresis, will undertake hemodialysis starting today. 2.  Anasarca: Secondary to worsening renal function HD nephrotic syndrome-renal biopsy pending.  Minimal contribution from newly diagnosed cirrhosis without portal hypertension. 3.  Urinary tract infection: Cultures noted and currently on ceftriaxone. 4.  Anemia of chronic illness: Without overt blood loss, will continue to follow hemoglobin and hematocrit closely to decide on need for ESA versus transfusion.  Subjective:   Underwent TDC placement yesterday with renal biopsy/bone marrow biopsy done today because he had had heparin given yesterday.  He continues to complain of discomfort from anasarca.   Objective:   BP 136/68 (BP Location: Right Wrist)   Pulse 66   Temp 97.9 F (36.6 C) (Oral)   Resp 20   Ht $R'6\' 1"'Jj$  (1.854 m)   Wt 117.9 kg   SpO2 99%   BMI 34.29 kg/m   Intake/Output Summary (Last 24 hours) at 08/22/2021 1113 Last data filed at 08/22/2021 0804 Gross per 24 hour  Intake 984.83 ml  Output 1026 ml  Net -41.17 ml   Weight change:   Physical Exam: Gen: Appears comfortable resting in bed, daughter at bedside CVS: Pulse regular rhythm, normal rate, S1 and S2 normal Resp: Decreased breath sounds over bilateral bases, no distinct rhonchi or rales Abd: Soft, obese, nontender Ext: 3+ anasarca with areas of skin denudation left and right  arms  Imaging: IR Fluoro Guide CV Line Right  Result Date: 08/21/2021 INDICATION: End-stage renal disease. In need durable intravenous access for the initiation hemodialysis. EXAM: TUNNELED CENTRAL VENOUS HEMODIALYSIS CATHETER PLACEMENT WITH ULTRASOUND AND FLUOROSCOPIC GUIDANCE MEDICATIONS: The patient is currently admitted to the hospital receiving intravenous antibiotics. The antibiotic was given in an appropriate time interval prior to skin puncture. ANESTHESIA/SEDATION: Moderate (conscious) sedation was employed during this procedure. A total of Versed 0.5 mg and Fentanyl 25 mcg was administered intravenously. Moderate Sedation Time: 15 minutes. The patient's level of consciousness and vital signs were monitored continuously by radiology nursing throughout the procedure under my direct supervision. FLUOROSCOPY TIME:  36 seconds (14 mGy) COMPLICATIONS: None immediate. PROCEDURE: Informed written consent was obtained from the patient after a discussion of the risks, benefits, and alternatives to treatment. Questions regarding the procedure were encouraged and answered. The right neck and chest were prepped with chlorhexidine in a sterile fashion, and a sterile drape was applied covering the operative field. Maximum barrier sterile technique with sterile gowns and gloves were used for the procedure. A timeout was performed prior to the initiation of the procedure. After creating a small venotomy incision, a micropuncture kit was utilized to access the internal jugular vein. Real-time ultrasound guidance was utilized for vascular access including the acquisition of a permanent ultrasound image documenting patency of the accessed vessel. The microwire was utilized to measure appropriate catheter length. A stiff Glidewire was advanced to the level of the IVC and the micropuncture sheath was exchanged for a peel-away sheath. A palindrome tunneled hemodialysis catheter measuring  19 cm from tip to cuff was tunneled  in a retrograde fashion from the anterior chest wall to the venotomy incision. The catheter was then placed through the peel-away sheath with tips ultimately positioned within the superior aspect of the right atrium. Final catheter positioning was confirmed and documented with a spot radiographic image. The catheter aspirates and flushes normally. The catheter was flushed with appropriate volume heparin dwells. The catheter exit site was secured with a 0-Prolene retention suture. The venotomy incision was closed with an interrupted 4-0 Vicryl, Dermabond and Steri-strips. Dressings were applied. The patient tolerated the procedure well without immediate post procedural complication. IMPRESSION: Successful placement of 19 cm tip to cuff tunneled hemodialysis catheter via the right internal jugular vein with tips terminating within the superior aspect of the right atrium. The catheter is ready for immediate use. Electronically Signed   By: Sandi Mariscal M.D.   On: 08/21/2021 14:57   IR US Guide Vasc Access Right  INDICATION: End-stage renal disease. In need durable intravenous access for the initiation hemodialysis.   EXAM: TUNNELED CENTRAL VENOUS HEMODIALYSIS CATHETER PLACEMENT WITH ULTRASOUND AND FLUOROSCOPIC GUIDANCE   MEDICATIONS: The patient is currently admitted to the hospital receiving intravenous antibiotics. The antibiotic was given in an appropriate time interval prior to skin puncture.   ANESTHESIA/SEDATION: Moderate (conscious) sedation was employed during this procedure. A total of Versed 0.5 mg and Fentanyl 25 mcg was administered intravenously.   Moderate Sedation Time: 15 minutes. The patient's level of consciousness and vital signs were monitored continuously by radiology nursing throughout the procedure under my direct supervision.   FLUOROSCOPY TIME:  36 seconds (14 mGy)   COMPLICATIONS: None immediate.   PROCEDURE: Informed written consent was obtained from the patient after a discussion of the  risks, benefits, and alternatives to treatment. Questions regarding the procedure were encouraged and answered. The right neck and chest were prepped with chlorhexidine in a sterile fashion, and a sterile drape was applied covering the operative field. Maximum barrier sterile technique with sterile gowns and gloves were used for the procedure. A timeout was performed prior to the initiation of the procedure.   After creating a small venotomy incision, a micropuncture kit was utilized to access the internal jugular vein. Real-time ultrasound guidance was utilized for vascular access including the acquisition of a permanent ultrasound image documenting patency of the accessed vessel. The microwire was utilized to measure appropriate catheter length.   A stiff Glidewire was advanced to the level of the IVC and the micropuncture sheath was exchanged for a peel-away sheath. A palindrome tunneled hemodialysis catheter measuring 19 cm from tip to cuff was tunneled in a retrograde fashion from the anterior chest wall to the venotomy incision.   The catheter was then placed through the peel-away sheath with tips ultimately positioned within the superior aspect of the right atrium. Final catheter positioning was confirmed and documented with a spot radiographic image. The catheter aspirates and flushes normally. The catheter was flushed with appropriate volume heparin dwells.   The catheter exit site was secured with a 0-Prolene retention suture. The venotomy incision was closed with an interrupted 4-0 Vicryl, Dermabond and Steri-strips. Dressings were applied. The patient tolerated the procedure well without immediate post procedural complication.   IMPRESSION: Successful placement of 19 cm tip to cuff tunneled hemodialysis catheter via the right internal jugular vein with tips terminating within the superior aspect of the right atrium. The catheter is ready for immediate use.     Electronically Signed  By: Sandi Mariscal M.D.    On: 08/21/2021 14:57  CT BONE MARROW BIOPSY & ASPIRATION  Result Date: 08/22/2021 INDICATION: Monoclonal gammopathy of unknown significance, amyloidosis EXAM: CT GUIDED LEFT ILIAC BONE MARROW ASPIRATION AND CORE BIOPSY Date:  08/22/2021 08/22/2021 10:32 am Radiologist:  M. Daryll Brod, MD Guidance:  CT FLUOROSCOPY TIME:  Fluoroscopy Time: None. MEDICATIONS: 1% lidocaine local ANESTHESIA/SEDATION: 1.0 mg IV Versed; 80 mcg IV Fentanyl Moderate Sedation Time:  27 minute The patient was continuously monitored during the procedure by the interventional radiology nurse under my direct supervision. CONTRAST:  None. COMPLICATIONS: None PROCEDURE: Informed consent was obtained from the patient following explanation of the procedure, risks, benefits and alternatives. The patient understands, agrees and consents for the procedure. All questions were addressed. A time out was performed. The patient was positioned prone and non-contrast localization CT was performed of the pelvis to demonstrate the iliac marrow spaces. Maximal barrier sterile technique utilized including caps, mask, sterile gowns, sterile gloves, large sterile drape, hand hygiene, and Betadine prep. Under sterile conditions and local anesthesia, an 11 gauge coaxial bone biopsy needle was advanced into the left iliac marrow space. Needle position was confirmed with CT imaging. Initially, bone marrow aspiration was performed. Next, the 11 gauge outer cannula was utilized to obtain a left iliac bone marrow core biopsy. Needle was removed. Hemostasis was obtained with compression. The patient tolerated the procedure well. Samples were prepared with the cytotechnologist. No immediate complications. IMPRESSION: CT guided left iliac bone marrow aspiration and core biopsy. Electronically Signed   By: Jerilynn Mages.  Shick M.D.   On: 08/22/2021 10:35   CT RENAL BIOPSY  Result Date: 08/22/2021 INDICATION: Renal failure, nephrotic syndrome, concern for amyloidosis EXAM:  ULTRASOUND LEFT RENAL RANDOM CORE BIOPSY MEDICATIONS: 1% LIDOCAINE LOCAL ANESTHESIA/SEDATION: Moderate (conscious) sedation was employed during this procedure. A total of Versed 1 mg and Fentanyl 80 mcg was administered intravenously. Moderate Sedation Time: 27 minutes. The patient's level of consciousness and vital signs were monitored continuously by radiology nursing throughout the procedure under my direct supervision. FLUOROSCOPY TIME:  Fluoroscopy Time: NONE. COMPLICATIONS: None immediate. PROCEDURE: Informed written consent was obtained from the patient after a thorough discussion of the procedural risks, benefits and alternatives. All questions were addressed. Maximal Sterile Barrier Technique was utilized including caps, mask, sterile gowns, sterile gloves, sterile drape, hand hygiene and skin antiseptic. A timeout was performed prior to the initiation of the procedure. Patient position prone. Previous imaging reviewed. Left kidney lower pole was localized and marked. Under sterile conditions and local anesthesia, a 15 gauge coaxial guide needle was advanced to the left kidney lower pole cortex. Needle position confirmed with ultrasound. Images obtained for documentation. 2 16 gauge core biopsies obtained through the access under direct ultrasound of the lower pole cortex. Samples were intact and non fragmented. These were placed in saline. Needle tract occluded with Gel-Foam. Postprocedure imaging demonstrates no hemorrhage or hematoma. Patient tolerated biopsy well. IMPRESSION: Successful ultrasound left kidney core biopsy Electronically Signed   By: Jerilynn Mages.  Shick M.D.   On: 08/22/2021 10:38    Labs: BMET Recent Labs  Lab 08/16/21 0404 08/17/21 0221 08/18/21 0301 08/19/21 0239 08/20/21 0729 08/20/21 1747 08/21/21 0355 08/22/21 0248  NA 136 135 135 135 136  --  137 136  K 4.7 5.0 4.8 4.5 5.2* 5.0 4.9 4.6  CL 106 105 104 103 104  --  104 103  CO2 22 21* 22 22 21*  --  24 24  GLUCOSE 82  92 89  89 106*  --  85 86  BUN 65* 68* 74* 78* 85*  --  93* 100*  CREATININE 3.56* 3.77* 4.06* 3.96* 4.28*  --  4.80* 4.82*  CALCIUM 7.9*  7.9* 8.1* 8.3* 8.2* 8.4*  --  8.0* 7.8*  PHOS 4.9*  --   --   --  7.0*  --  7.2*  --    CBC Recent Labs  Lab 08/18/21 0301 08/19/21 0239 08/20/21 0200 08/21/21 0355  WBC 6.5 6.1 5.6 8.4  HGB 9.2* 8.5* 10.0* 9.3*  HCT 28.2* 25.8* 30.4* 28.7*  MCV 87.0 88.1 86.9 88.0  PLT 286 280 334 312    Medications:     atorvastatin  40 mg Oral Daily   Chlorhexidine Gluconate Cloth  6 each Topical Daily   ezetimibe  10 mg Oral QPC supper   gelatin adsorbable       hydrALAZINE  25 mg Oral Q8H   isosorbide mononitrate  60 mg Oral Daily   lidocaine       lidocaine       metolazone  10 mg Oral Daily   sodium chloride flush  3 mL Intravenous Q12H   tamsulosin  0.4 mg Oral Daily   Elmarie Shiley, MD 08/22/2021, 11:13 AM

## 2021-08-22 NOTE — Care Management Important Message (Signed)
Important Message  Patient Details  Name: Corey Beard MRN: [id number] Date of Birth: 08/27/1946   Medicare Important Message Given:  Yes     [name] 08/22/2021, 8:44 AM

## 2021-08-22 NOTE — Progress Notes (Signed)
PT Cancellation Note  Patient Details Name: Corey Beard MRN: [id number] DOB: March 11, 1946   Cancelled Treatment:    Reason Eval/Treat Not Completed: Patient at procedure or test/unavailable. Transport entered right after PT to take pt for biopsy. PT to return as able, as appropriate to progress mobility.  Corey Beard, PT, DPT Acute Rehabilitation Services Pager #: [phone] Office #: [phone]    [name] 08/22/2021, 7:37 AM

## 2021-08-23 DIAGNOSIS — N049 Nephrotic syndrome with unspecified morphologic changes: Secondary | ICD-10-CM | POA: Diagnosis not present

## 2021-08-23 LAB — BASIC METABOLIC PANEL
Anion gap: 11 (ref 5–15)
BUN: 84 mg/dL — ABNORMAL HIGH (ref 8–23)
CO2: 21 mmol/L — ABNORMAL LOW (ref 22–32)
Calcium: 7.6 mg/dL — ABNORMAL LOW (ref 8.9–10.3)
Chloride: 103 mmol/L (ref 98–111)
Creatinine, Ser: 4.03 mg/dL — ABNORMAL HIGH (ref 0.61–1.24)
GFR, Estimated: 15 mL/min — ABNORMAL LOW (ref >60)
Glucose, Bld: 104 mg/dL — ABNORMAL HIGH (ref 70–99)
Potassium: 4.3 mmol/L (ref 3.5–5.1)
Sodium: 135 mmol/L (ref 135–145)

## 2021-08-23 LAB — CULTURE, BODY FLUID W GRAM STAIN -BOTTLE: Culture: NO GROWTH

## 2021-08-23 LAB — HEPATITIS B SURFACE ANTIBODY, QUANTITATIVE: Hep B S AB Quant (Post): <3.1 m[IU]/mL — ABNORMAL LOW (ref >9.9)

## 2021-08-23 MED ORDER — HEPARIN SODIUM (PORCINE) 1000 UNIT/ML IJ SOLN
INTRAMUSCULAR | Status: AC
  Filled 2021-08-23: qty 1

## 2021-08-23 MED ORDER — HEPARIN SODIUM (PORCINE) 5000 UNIT/ML IJ SOLN
5000.0000 [IU] | Freq: Three times a day (TID) | INTRAMUSCULAR | Status: DC
  Administered 2021-08-23 – 2021-09-01 (×21): 5000 [IU] via SUBCUTANEOUS
  Filled 2021-08-23 (×23): qty 1

## 2021-08-23 NOTE — Plan of Care (Signed)

## 2021-08-23 NOTE — Progress Notes (Signed)
Met with pt at bedside to discuss pt's likely need for out-pt HD at d/c per MD. Pt prefers [facility] if possible. Will begin clipping process in the am. Pt reports that he will need transportation to/from HD at d/c. Will f/u with [facility] staff tomorrow regarding this need. Will follow and assist.   Melven Sartorius Renal Navigator [phone]

## 2021-08-23 NOTE — Progress Notes (Signed)
Patient ID: Corey Beard, male   DOB: 1946-06-01, 75 y.o.   MRN: [id number] [facility] Progress Note   Assessment/ Plan:   1. Acute kidney Injury: Earlier this year with creatinine of about 1.3 with significant rise over the last few weeks.  Significant proteinuria with M spike noted on serum protein electrophoresis along with elevated lambda light chains-suspicion raised for multiple myeloma versus amyloidosis for which he underwent renal biopsy along with bone marrow biopsy on 10/11 by IR.  Started on hemodialysis yesterday for management of severe azotemia/volume. 2.  Anasarca: Secondary to worsening renal function/nephrotic syndrome-renal biopsy pending.  Minimal contribution from newly diagnosed cirrhosis without portal hypertension. 3.  Urinary tract infection: Cultures noted and currently on ceftriaxone. 4.  Anemia of chronic illness: Without overt blood loss, will continue to follow hemoglobin and hematocrit closely to decide on need for ESA versus transfusion.  Subjective:   Reports that he tolerated dialysis yesterday without notable changes in anasarca and feels better with decreasing BUN.   Objective:   BP 139/62 (BP Location: Right Wrist)   Pulse 65   Temp 99 F (37.2 C) (Oral)   Resp 20   Ht _0  (1.854 m)   Wt 116 kg   SpO2 91%   BMI 33.74 kg/m   Intake/Output Summary (Last 24 hours) at 08/23/2021 0817 Last data filed at 08/23/2021 9449 Gross per 24 hour  Intake 360 ml  Output 2250 ml  Net -1890 ml   Weight change:   Physical Exam: Gen: Appears comfortable resting in bed, working on breakfast CVS: Pulse regular rhythm, normal rate, S1 and S2 normal Resp: Decreased breath sounds over bilateral bases, no distinct rhonchi or rales.  Right IJ TDC Abd: Soft, obese, nontender Ext: 3+ anasarca with areas of skin denudation over both antecubital surfaces  Imaging: IR Fluoro Guide CV Line Right  Result Date: 08/21/2021 INDICATION: End-stage renal  disease. In need durable intravenous access for the initiation hemodialysis. EXAM: TUNNELED CENTRAL VENOUS HEMODIALYSIS CATHETER PLACEMENT WITH ULTRASOUND AND FLUOROSCOPIC GUIDANCE MEDICATIONS: The patient is currently admitted to the hospital receiving intravenous antibiotics. The antibiotic was given in an appropriate time interval prior to skin puncture. ANESTHESIA/SEDATION: Moderate (conscious) sedation was employed during this procedure. A total of Versed 0.5 mg and Fentanyl 25 mcg was administered intravenously. Moderate Sedation Time: 15 minutes. The patient's level of consciousness and vital signs were monitored continuously by radiology nursing throughout the procedure under my direct supervision. FLUOROSCOPY TIME:  36 seconds (14 mGy) COMPLICATIONS: None immediate. PROCEDURE: Informed written consent was obtained from the patient after a discussion of the risks, benefits, and alternatives to treatment. Questions regarding the procedure were encouraged and answered. The right neck and chest were prepped with chlorhexidine in a sterile fashion, and a sterile drape was applied covering the operative field. Maximum barrier sterile technique with sterile gowns and gloves were used for the procedure. A timeout was performed prior to the initiation of the procedure. After creating a small venotomy incision, a micropuncture kit was utilized to access the internal jugular vein. Real-time ultrasound guidance was utilized for vascular access including the acquisition of a permanent ultrasound image documenting patency of the accessed vessel. The microwire was utilized to measure appropriate catheter length. A stiff Glidewire was advanced to the level of the IVC and the micropuncture sheath was exchanged for a peel-away sheath. A palindrome tunneled hemodialysis catheter measuring 19 cm from tip to cuff was tunneled in a retrograde fashion from the anterior  chest wall to the venotomy incision. The catheter was then  placed through the peel-away sheath with tips ultimately positioned within the superior aspect of the right atrium. Final catheter positioning was confirmed and documented with a spot radiographic image. The catheter aspirates and flushes normally. The catheter was flushed with appropriate volume heparin dwells. The catheter exit site was secured with a 0-Prolene retention suture. The venotomy incision was closed with an interrupted 4-0 Vicryl, Dermabond and Steri-strips. Dressings were applied. The patient tolerated the procedure well without immediate post procedural complication. IMPRESSION: Successful placement of 19 cm tip to cuff tunneled hemodialysis catheter via the right internal jugular vein with tips terminating within the superior aspect of the right atrium. The catheter is ready for immediate use. Electronically Signed   By: Sandi Mariscal M.D.   On: 08/21/2021 14:57   IR US Guide Vasc Access Right  INDICATION: End-stage renal disease. In need durable intravenous access for the initiation hemodialysis.   EXAM: TUNNELED CENTRAL VENOUS HEMODIALYSIS CATHETER PLACEMENT WITH ULTRASOUND AND FLUOROSCOPIC GUIDANCE   MEDICATIONS: The patient is currently admitted to the hospital receiving intravenous antibiotics. The antibiotic was given in an appropriate time interval prior to skin puncture.   ANESTHESIA/SEDATION: Moderate (conscious) sedation was employed during this procedure. A total of Versed 0.5 mg and Fentanyl 25 mcg was administered intravenously.   Moderate Sedation Time: 15 minutes. The patient's level of consciousness and vital signs were monitored continuously by radiology nursing throughout the procedure under my direct supervision.   FLUOROSCOPY TIME:  36 seconds (14 mGy)   COMPLICATIONS: None immediate.   PROCEDURE: Informed written consent was obtained from the patient after a discussion of the risks, benefits, and alternatives to treatment. Questions regarding the procedure were encouraged and  answered. The right neck and chest were prepped with chlorhexidine in a sterile fashion, and a sterile drape was applied covering the operative field. Maximum barrier sterile technique with sterile gowns and gloves were used for the procedure. A timeout was performed prior to the initiation of the procedure.   After creating a small venotomy incision, a micropuncture kit was utilized to access the internal jugular vein. Real-time ultrasound guidance was utilized for vascular access including the acquisition of a permanent ultrasound image documenting patency of the accessed vessel. The microwire was utilized to measure appropriate catheter length.   A stiff Glidewire was advanced to the level of the IVC and the micropuncture sheath was exchanged for a peel-away sheath. A palindrome tunneled hemodialysis catheter measuring 19 cm from tip to cuff was tunneled in a retrograde fashion from the anterior chest wall to the venotomy incision.   The catheter was then placed through the peel-away sheath with tips ultimately positioned within the superior aspect of the right atrium. Final catheter positioning was confirmed and documented with a spot radiographic image. The catheter aspirates and flushes normally. The catheter was flushed with appropriate volume heparin dwells.   The catheter exit site was secured with a 0-Prolene retention suture. The venotomy incision was closed with an interrupted 4-0 Vicryl, Dermabond and Steri-strips. Dressings were applied. The patient tolerated the procedure well without immediate post procedural complication.   IMPRESSION: Successful placement of 19 cm tip to cuff tunneled hemodialysis catheter via the right internal jugular vein with tips terminating within the superior aspect of the right atrium. The catheter is ready for immediate use.     Electronically Signed   By: Sandi Mariscal M.D.   On: 08/21/2021 14:57  CT BONE  MARROW BIOPSY & ASPIRATION  Result Date: 08/22/2021 INDICATION:  Monoclonal gammopathy of unknown significance, amyloidosis EXAM: CT GUIDED LEFT ILIAC BONE MARROW ASPIRATION AND CORE BIOPSY Date:  08/22/2021 08/22/2021 10:32 am Radiologist:  M. Daryll Brod, MD Guidance:  CT FLUOROSCOPY TIME:  Fluoroscopy Time: None. MEDICATIONS: 1% lidocaine local ANESTHESIA/SEDATION: 1.0 mg IV Versed; 80 mcg IV Fentanyl Moderate Sedation Time:  27 minute The patient was continuously monitored during the procedure by the interventional radiology nurse under my direct supervision. CONTRAST:  None. COMPLICATIONS: None PROCEDURE: Informed consent was obtained from the patient following explanation of the procedure, risks, benefits and alternatives. The patient understands, agrees and consents for the procedure. All questions were addressed. A time out was performed. The patient was positioned prone and non-contrast localization CT was performed of the pelvis to demonstrate the iliac marrow spaces. Maximal barrier sterile technique utilized including caps, mask, sterile gowns, sterile gloves, large sterile drape, hand hygiene, and Betadine prep. Under sterile conditions and local anesthesia, an 11 gauge coaxial bone biopsy needle was advanced into the left iliac marrow space. Needle position was confirmed with CT imaging. Initially, bone marrow aspiration was performed. Next, the 11 gauge outer cannula was utilized to obtain a left iliac bone marrow core biopsy. Needle was removed. Hemostasis was obtained with compression. The patient tolerated the procedure well. Samples were prepared with the cytotechnologist. No immediate complications. IMPRESSION: CT guided left iliac bone marrow aspiration and core biopsy. Electronically Signed   By: Jerilynn Mages.  Shick M.D.   On: 08/22/2021 10:35   CT RENAL BIOPSY  Result Date: 08/22/2021 INDICATION: Renal failure, nephrotic syndrome, concern for amyloidosis EXAM: ULTRASOUND LEFT RENAL RANDOM CORE BIOPSY MEDICATIONS: 1% LIDOCAINE LOCAL ANESTHESIA/SEDATION: Moderate  (conscious) sedation was employed during this procedure. A total of Versed 1 mg and Fentanyl 80 mcg was administered intravenously. Moderate Sedation Time: 27 minutes. The patient's level of consciousness and vital signs were monitored continuously by radiology nursing throughout the procedure under my direct supervision. FLUOROSCOPY TIME:  Fluoroscopy Time: NONE. COMPLICATIONS: None immediate. PROCEDURE: Informed written consent was obtained from the patient after a thorough discussion of the procedural risks, benefits and alternatives. All questions were addressed. Maximal Sterile Barrier Technique was utilized including caps, mask, sterile gowns, sterile gloves, sterile drape, hand hygiene and skin antiseptic. A timeout was performed prior to the initiation of the procedure. Patient position prone. Previous imaging reviewed. Left kidney lower pole was localized and marked. Under sterile conditions and local anesthesia, a 15 gauge coaxial guide needle was advanced to the left kidney lower pole cortex. Needle position confirmed with ultrasound. Images obtained for documentation. 2 16 gauge core biopsies obtained through the access under direct ultrasound of the lower pole cortex. Samples were intact and non fragmented. These were placed in saline. Needle tract occluded with Gel-Foam. Postprocedure imaging demonstrates no hemorrhage or hematoma. Patient tolerated biopsy well. IMPRESSION: Successful ultrasound left kidney core biopsy Electronically Signed   By: Jerilynn Mages.  Shick M.D.   On: 08/22/2021 10:38    Labs: BMET Recent Labs  Lab 08/17/21 0221 08/18/21 0301 08/19/21 0239 08/20/21 0729 08/20/21 1747 08/21/21 0355 08/22/21 0248 08/23/21 0257  NA 135 135 135 136  --  137 136 135  K 5.0 4.8 4.5 5.2* 5.0 4.9 4.6 4.3  CL 105 104 103 104  --  104 103 103  CO2 21* 22 22 21*  --  24 24 21*  GLUCOSE 92 89 89 106*  --  85 86 104*  BUN 68* 74*  78* 85*  --  93* 100* 84*  CREATININE 3.77* 4.06* 3.96* 4.28*  --   4.80* 4.82* 4.03*  CALCIUM 8.1* 8.3* 8.2* 8.4*  --  8.0* 7.8* 7.6*  PHOS  --   --   --  7.0*  --  7.2*  --   --    CBC Recent Labs  Lab 08/18/21 0301 08/19/21 0239 08/20/21 0200 08/21/21 0355  WBC 6.5 6.1 5.6 8.4  HGB 9.2* 8.5* 10.0* 9.3*  HCT 28.2* 25.8* 30.4* 28.7*  MCV 87.0 88.1 86.9 88.0  PLT 286 280 334 312    Medications:     atorvastatin  40 mg Oral Daily   Chlorhexidine Gluconate Cloth  6 each Topical Daily   ezetimibe  10 mg Oral QPC supper   hydrALAZINE  25 mg Oral Q8H   isosorbide mononitrate  60 mg Oral Daily   metolazone  10 mg Oral Daily   sodium chloride flush  3 mL Intravenous Q12H   tamsulosin  0.4 mg Oral Daily   Elmarie Shiley, MD 08/23/2021, 8:17 AM

## 2021-08-23 NOTE — Procedures (Signed)
Patient seen on Hemodialysis. BP (!) 161/45 (BP Location: Right Wrist)   Pulse 65   Temp 99 F (37.2 C) (Oral)   Resp 20   Ht 6\' 1"  (1.854 m)   Wt 116.8 kg   SpO2 94%   BMI 33.97 kg/m   QB 250, UF goal 2L Tolerating treatment without complaints at this time.   Elmarie Shiley MD [facility]. Office # [phone] Pager # [phone] 11:15 AM

## 2021-08-23 NOTE — Progress Notes (Signed)
OT Cancellation Note  Patient Details Name: Corey Beard MRN: [id number] DOB: 09-Apr-1946   Cancelled Treatment:    Reason Eval/Treat Not Completed: Patient at procedure or test/ unavailable (HD)  Malka So 08/23/2021, 10:50 AM Nestor Lewandowsky, OTR/L Acute Rehabilitation Services Pager: [phone] Office: [phone]

## 2021-08-23 NOTE — Progress Notes (Signed)
PT Cancellation Note  Patient Details Name: Corey Beard MRN: [id number] DOB: 10-30-1946   Cancelled Treatment:    Reason Eval/Treat Not Completed: (P) Patient at procedure or test/unavailable Pt is off the floor for HD. PT will follow back this afternoon as able.  Burns Timson B. Migdalia Dk PT, DPT Acute Rehabilitation Services Pager [phone] Office [phone]    [place] 08/23/2021, 10:35 AM

## 2021-08-23 NOTE — Progress Notes (Signed)
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 He is found to have bilateral pleural effusions R>L, with recent 2 L thoracentesis performed on 08/07/2021. Cr  on admission up at 3.3 from baseline of 1.2-1.3 a year ago. He has 3+ proteinuria. He was initially admitted by cardiology, transferred to [facility], [place].  Nephrology consulted for evaluation of nephrotic syndrome.  He underwent successful placement of tunneled HD catheter on 08/21/2021. He underwent bone marrow biopsy and renal biopsy on 10/11 and is started on hemodialysis.     Assessment & Plan:   Principal Problem:   Nephrotic syndrome Active Problems:   Malignant neoplasm of prostate (HCC)   Coronary artery disease   Essential hypertension   Dyslipidemia  Anasarca with bilateral pleural effusions/proteinuria/acute kidney injury ascites and hypoalbuminemia: Nephrotic syndrome versus liver cirrhosis versus amyloidosis.  Echocardiogram was essentially normal.  Ultrasound of the kidneys normal-appearing kidneys with exception of left lower pole renal cyst. Due to significant uremia and fluid overload, started on hemodialysis 10/11, 10/12. Status post renal biopsy and bone marrow biopsy, results pending.  Followed by nephrology. Skeletal survey is negative for lytic or sclerotic lesions.  Oncology following. GI consulted with new diagnosis of cirrhosis without portal hypertension, they recommended liver biopsy if renal biopsy and kidney biopsy is not diagnostic. Along with hemodialysis, patient is on high-dose IV Lasix 160 mg 3 times daily with metolazone to try aggressive diuresis. 100 mL paracentesis, negative cultures.  Essential hypertension: Blood pressure  optimal controlled on hydralazine, Imdur and Lasix.  Coronary artery disease: Currently asymptomatic.  BPH: On Flomax.  Abnormal urinalysis: Final culture with multiple species.  Repeat cultures are ordered.  He completed IV Rocephin.  No indication to continue.   DVT prophylaxis:   Heparin subcu   Code Status: Partial Family Communication: None.  Patient is communicating with family. Disposition Plan: Status is: Inpatient  Remains inpatient appropriate because:IV treatments appropriate due to intensity of illness or inability to take PO and Inpatient level of care appropriate due to severity of illness  Dispo: The patient is from: Home              Anticipated d/c is to: Home              Patient currently is not medically stable to d/c.   Difficult to place patient No         Consultants:  Cardiology Nephrology  Procedures:  Renal biopsy, bone marrow biopsy 10/11 Right permacath 10/10.  Antimicrobials:  Rocephin.  Completed antibiotics.   Subjective:  I am OK , still swollen   Objective: Vitals:   08/22/21 2256 08/23/21 0026 08/23/21 0636 08/23/21 0825  BP: 122/60 121/60 139/62 (!) 122/50  Pulse:  64 65 65  Resp: _0 Temp: 98.6 F (37 C) 98 F (36.7 C) 99 F (37.2 C) 98.1 F (36.7 C)  TempSrc: Oral Oral Oral Oral  SpO2: 90% 94% 91% 93%  Weight:  116 kg    Height:        Intake/Output Summary (Last 24 hours) at 08/23/2021 0826 Last data filed at 08/23/2021 0824 Gross per 24 hour  Intake 600 ml  Output 2250 ml  Net -1650 ml   Filed Weights   08/22/21  1521 08/22/21 1734 08/23/21 0026  Weight: 121.2 kg 120.1 kg 116 kg    Examination:  General exam: Appears calm and comfortable , on room air.  Anasarca ,RUE greatly swollen ,  Respiratory system: Clear to auscultation. Respiratory effort normal. Right IJ permacath present  Cardiovascular system: S1 & S2 heard, RRR. No JVD, murmurs, rubs, gallops or clicks. No pedal  edema. Gastrointestinal system: Abdomen is nondistended, soft and nontender. No organomegaly or masses felt. Normal bowel sounds heard. Central nervous system: Alert and oriented. No focal neurological deficits.   Data Reviewed: I have personally reviewed following labs and imaging studies  CBC: Recent Labs  Lab 08/18/21 0301 08/19/21 0239 08/20/21 0200 08/21/21 0355  WBC 6.5 6.1 5.6 8.4  HGB 9.2* 8.5* 10.0* 9.3*  HCT 28.2* 25.8* 30.4* 28.7*  MCV 87.0 88.1 86.9 88.0  PLT 286 280 334 093   Basic Metabolic Panel: Recent Labs  Lab 08/19/21 0239 08/20/21 0729 08/20/21 1747 08/21/21 0355 08/22/21 0248 08/23/21 0257  NA 135 136  --  137 136 135  K 4.5 5.2* 5.0 4.9 4.6 4.3  CL 103 104  --  104 103 103  CO2 22 21*  --  24 24 21*  GLUCOSE 89 106*  --  85 86 104*  BUN 78* 85*  --  93* 100* 84*  CREATININE 3.96* 4.28*  --  4.80* 4.82* 4.03*  CALCIUM 8.2* 8.4*  --  8.0* 7.8* 7.6*  PHOS  --  7.0*  --  7.2*  --   --    GFR: Estimated Creatinine Clearance: 21.8 mL/min (A) (by C-G formula based on SCr of 4.03 mg/dL (H)). Liver Function Tests: Recent Labs  Lab 08/17/21 0221 08/18/21 0301 08/19/21 0239 08/20/21 0729 08/21/21 0355  AST _0 --   --   ALT _1 --   --   ALKPHOS 63 48 46  --   --   BILITOT 0.5 0.6 0.8  --   --   PROT 4.4* 4.5* 4.3*  --   --   ALBUMIN 1.6* 2.2*  2.2* 2.2* 2.5* 2.3*   No results for input(s): LIPASE, AMYLASE in the last 168 hours. No results for input(s): AMMONIA in the last 168 hours. Coagulation Profile: Recent Labs  Lab 08/21/21 0355  INR 1.0   Cardiac Enzymes: No results for input(s): CKTOTAL, CKMB, CKMBINDEX, TROPONINI in the last 168 hours. BNP (last 3 results) No results for input(s): PROBNP in the last 8760 hours. HbA1C: No results for input(s): HGBA1C in the last 72 hours. CBG: No results for input(s): GLUCAP in the last 168 hours. Lipid Profile: No results for input(s): CHOL, HDL, LDLCALC, TRIG, CHOLHDL,  LDLDIRECT in the last 72 hours. Thyroid Function Tests: No results for input(s): TSH, T4TOTAL, FREET4, T3FREE, THYROIDAB in the last 72 hours. Anemia Panel: No results for input(s): VITAMINB12, FOLATE, FERRITIN, TIBC, IRON, RETICCTPCT in the last 72 hours. Sepsis Labs: No results for input(s): PROCALCITON, LATICACIDVEN in the last 168 hours.  Recent Results (from the past 240 hour(s))  Resp Panel by RT-PCR (Flu A&B, Covid) Nasopharyngeal Swab     Status: None   Collection Time: 08/14/21  7:57 PM   Specimen: Nasopharyngeal Swab; Nasopharyngeal(NP) swabs in vial transport medium  Result Value Ref Range Status   SARS Coronavirus 2 by RT PCR NEGATIVE NEGATIVE Final    Comment: (NOTE) SARS-CoV-2 target nucleic acids are NOT DETECTED.  The SARS-CoV-2 RNA is generally detectable in upper respiratory specimens  during the acute phase of infection. The lowest concentration of SARS-CoV-2 viral copies this assay can detect is 138 copies/mL. A negative result does not preclude SARS-Cov-2 infection and should not be used as the sole basis for treatment or other patient management decisions. A negative result may occur with  improper specimen collection/handling, submission of specimen other than nasopharyngeal swab, presence of viral mutation(s) within the areas targeted by this assay, and inadequate number of viral copies(<138 copies/mL). A negative result must be combined with clinical observations, patient history, and epidemiological information. The expected result is Negative.  Fact Sheet for Patients:  EntrepreneurPulse.com.au  Fact Sheet for Healthcare Providers:  IncredibleEmployment.be  This test is no t yet approved or cleared by the [place] FDA and  has been authorized for detection and/or diagnosis of SARS-CoV-2 by FDA under an Emergency Use Authorization (EUA). This EUA will remain  in effect (meaning this test can be used) for the  duration of the COVID-19 declaration under Section 564(b)(1) of the Act, 21 U.S.C.section 360bbb-3(b)(1), unless the authorization is terminated  or revoked sooner.       Influenza A by PCR NEGATIVE NEGATIVE Final   Influenza B by PCR NEGATIVE NEGATIVE Final    Comment: (NOTE) The Xpert Xpress SARS-CoV-2/FLU/RSV plus assay is intended as an aid in the diagnosis of influenza from Nasopharyngeal swab specimens and should not be used as a sole basis for treatment. Nasal washings and aspirates are unacceptable for Xpert Xpress SARS-CoV-2/FLU/RSV testing.  Fact Sheet for Patients: EntrepreneurPulse.com.au  Fact Sheet for Healthcare Providers: IncredibleEmployment.be  This test is not yet approved or cleared by the [place] FDA and has been authorized for detection and/or diagnosis of SARS-CoV-2 by FDA under an Emergency Use Authorization (EUA). This EUA will remain in effect (meaning this test can be used) for the duration of the COVID-19 declaration under Section 564(b)(1) of the Act, 21 U.S.C. section 360bbb-3(b)(1), unless the authorization is terminated or revoked.  Performed at [facility], [place] [address], [place], [place] [address]   Urine Culture     Status: Abnormal   Collection Time: 08/15/21  7:56 AM   Specimen: Urine, Clean Catch  Result Value Ref Range Status   Specimen Description URINE, CLEAN CATCH  Final   Special Requests   Final    Normal Performed at [facility], [place] [address], [place], [place] [address]    Culture MULTIPLE SPECIES PRESENT, SUGGEST RECOLLECTION (A)  Final   Report Status 08/16/2021 FINAL  Final  Urine Culture     Status: Abnormal   Collection Time: 08/17/21  5:24 AM   Specimen: Urine, Clean Catch  Result Value Ref Range Status   Specimen Description URINE, CLEAN CATCH  Final   Special Requests   Final    NONE Performed at [facility], [place] [address], [place], [place]  [address]    Culture MULTIPLE SPECIES PRESENT, SUGGEST RECOLLECTION (A)  Final   Report Status 08/18/2021 FINAL  Final  Gram stain     Status: None   Collection Time: 08/18/21  9:27 AM   Specimen: Abdomen; Peritoneal Fluid  Result Value Ref Range Status   Specimen Description PERITONEAL FLUID  Final   Special Requests NONE  Final   Gram Stain   Final    WBC PRESENT, PREDOMINANTLY MONONUCLEAR NO ORGANISMS SEEN CYTOSPIN SMEAR Performed at [facility], [place] [address] [place], [place] [address]    Report Status 08/18/2021 FINAL  Final  Culture, body fluid w Gram Stain-bottle     Status: None (Preliminary result)   Collection Time: 08/18/21  9:27 AM   Specimen: Peritoneal Washings  Result Value Ref Range Status   Specimen Description PERITONEAL FLUID  Final   Special Requests NONE  Final   Culture   Final    NO GROWTH 4 DAYS Performed at [facility], [address], [place], [place] [address]    Report Status PENDING  Incomplete         Radiology Studies: IR Fluoro Guide CV Line Right  Result Date: 08/21/2021 INDICATION: End-stage renal disease. In need durable intravenous access for the initiation hemodialysis. EXAM: TUNNELED CENTRAL VENOUS HEMODIALYSIS CATHETER PLACEMENT WITH ULTRASOUND AND FLUOROSCOPIC GUIDANCE MEDICATIONS: The patient is currently admitted to the hospital receiving intravenous antibiotics. The antibiotic was given in an appropriate time interval prior to skin puncture. ANESTHESIA/SEDATION: Moderate (conscious) sedation was employed during this procedure. A total of Versed 0.5 mg and Fentanyl 25 mcg was administered intravenously. Moderate Sedation Time: 15 minutes. The patient's level of consciousness and vital signs were monitored continuously by radiology nursing throughout the procedure under my direct supervision. FLUOROSCOPY TIME:  36 seconds (14 mGy) COMPLICATIONS: None immediate. PROCEDURE: Informed written consent was obtained from the patient  after a discussion of the risks, benefits, and alternatives to treatment. Questions regarding the procedure were encouraged and answered. The right neck and chest were prepped with chlorhexidine in a sterile fashion, and a sterile drape was applied covering the operative field. Maximum barrier sterile technique with sterile gowns and gloves were used for the procedure. A timeout was performed prior to the initiation of the procedure. After creating a small venotomy incision, a micropuncture kit was utilized to access the internal jugular vein. Real-time ultrasound guidance was utilized for vascular access including the acquisition of a permanent ultrasound image documenting patency of the accessed vessel. The microwire was utilized to measure appropriate catheter length. A stiff Glidewire was advanced to the level of the IVC and the micropuncture sheath was exchanged for a peel-away sheath. A palindrome tunneled hemodialysis catheter measuring 19 cm from tip to cuff was tunneled in a retrograde fashion from the anterior chest wall to the venotomy incision. The catheter was then placed through the peel-away sheath with tips ultimately positioned within the superior aspect of the right atrium. Final catheter positioning was confirmed and documented with a spot radiographic image. The catheter aspirates and flushes normally. The catheter was flushed with appropriate volume heparin dwells. The catheter exit site was secured with a 0-Prolene retention suture. The venotomy incision was closed with an interrupted 4-0 Vicryl, Dermabond and Steri-strips. Dressings were applied. The patient tolerated the procedure well without immediate post procedural complication. IMPRESSION: Successful placement of 19 cm tip to cuff tunneled hemodialysis catheter via the right internal jugular vein with tips terminating within the superior aspect of the right atrium. The catheter is ready for immediate use. Electronically Signed   By: Sandi Mariscal M.D.   On: 08/21/2021 14:57   IR US Guide Vasc Access Right  INDICATION: End-stage renal disease. In need durable intravenous access for the initiation hemodialysis.   EXAM: TUNNELED CENTRAL VENOUS HEMODIALYSIS CATHETER PLACEMENT WITH ULTRASOUND AND FLUOROSCOPIC GUIDANCE   MEDICATIONS: The patient is currently admitted to the hospital receiving intravenous antibiotics. The antibiotic was given in an appropriate time interval prior to skin puncture.   ANESTHESIA/SEDATION: Moderate (conscious) sedation was employed during this procedure. A total of Versed  0.5 mg and Fentanyl 25 mcg was administered intravenously.   Moderate Sedation Time: 15 minutes. The patient's level of consciousness and vital signs were monitored continuously by radiology nursing throughout the procedure under my direct supervision.   FLUOROSCOPY TIME:  36 seconds (14 mGy)   COMPLICATIONS: None immediate.   PROCEDURE: Informed written consent was obtained from the patient after a discussion of the risks, benefits, and alternatives to treatment. Questions regarding the procedure were encouraged and answered. The right neck and chest were prepped with chlorhexidine in a sterile fashion, and a sterile drape was applied covering the operative field. Maximum barrier sterile technique with sterile gowns and gloves were used for the procedure. A timeout was performed prior to the initiation of the procedure.   After creating a small venotomy incision, a micropuncture kit was utilized to access the internal jugular vein. Real-time ultrasound guidance was utilized for vascular access including the acquisition of a permanent ultrasound image documenting patency of the accessed vessel. The microwire was utilized to measure appropriate catheter length.   A stiff Glidewire was advanced to the level of the IVC and the micropuncture sheath was exchanged for a peel-away sheath. A palindrome tunneled hemodialysis catheter measuring 19 cm from tip to  cuff was tunneled in a retrograde fashion from the anterior chest wall to the venotomy incision.   The catheter was then placed through the peel-away sheath with tips ultimately positioned within the superior aspect of the right atrium. Final catheter positioning was confirmed and documented with a spot radiographic image. The catheter aspirates and flushes normally. The catheter was flushed with appropriate volume heparin dwells.   The catheter exit site was secured with a 0-Prolene retention suture. The venotomy incision was closed with an interrupted 4-0 Vicryl, Dermabond and Steri-strips. Dressings were applied. The patient tolerated the procedure well without immediate post procedural complication.   IMPRESSION: Successful placement of 19 cm tip to cuff tunneled hemodialysis catheter via the right internal jugular vein with tips terminating within the superior aspect of the right atrium. The catheter is ready for immediate use.     Electronically Signed   By: Sandi Mariscal M.D.   On: 08/21/2021 14:57  CT BONE MARROW BIOPSY & ASPIRATION  Result Date: 08/22/2021 INDICATION: Monoclonal gammopathy of unknown significance, amyloidosis EXAM: CT GUIDED LEFT ILIAC BONE MARROW ASPIRATION AND CORE BIOPSY Date:  08/22/2021 08/22/2021 10:32 am Radiologist:  M. Daryll Brod, MD Guidance:  CT FLUOROSCOPY TIME:  Fluoroscopy Time: None. MEDICATIONS: 1% lidocaine local ANESTHESIA/SEDATION: 1.0 mg IV Versed; 80 mcg IV Fentanyl Moderate Sedation Time:  27 minute The patient was continuously monitored during the procedure by the interventional radiology nurse under my direct supervision. CONTRAST:  None. COMPLICATIONS: None PROCEDURE: Informed consent was obtained from the patient following explanation of the procedure, risks, benefits and alternatives. The patient understands, agrees and consents for the procedure. All questions were addressed. A time out was performed. The patient was positioned prone and non-contrast  localization CT was performed of the pelvis to demonstrate the iliac marrow spaces. Maximal barrier sterile technique utilized including caps, mask, sterile gowns, sterile gloves, large sterile drape, hand hygiene, and Betadine prep. Under sterile conditions and local anesthesia, an 11 gauge coaxial bone biopsy needle was advanced into the left iliac marrow space. Needle position was confirmed with CT imaging. Initially, bone marrow aspiration was performed. Next, the 11 gauge outer cannula was utilized to obtain a left iliac bone marrow core biopsy. Needle was removed. Hemostasis was obtained  with compression. The patient tolerated the procedure well. Samples were prepared with the cytotechnologist. No immediate complications. IMPRESSION: CT guided left iliac bone marrow aspiration and core biopsy. Electronically Signed   By: Jerilynn Mages.  Shick M.D.   On: 08/22/2021 10:35   CT RENAL BIOPSY  Result Date: 08/22/2021 INDICATION: Renal failure, nephrotic syndrome, concern for amyloidosis EXAM: ULTRASOUND LEFT RENAL RANDOM CORE BIOPSY MEDICATIONS: 1% LIDOCAINE LOCAL ANESTHESIA/SEDATION: Moderate (conscious) sedation was employed during this procedure. A total of Versed 1 mg and Fentanyl 80 mcg was administered intravenously. Moderate Sedation Time: 27 minutes. The patient's level of consciousness and vital signs were monitored continuously by radiology nursing throughout the procedure under my direct supervision. FLUOROSCOPY TIME:  Fluoroscopy Time: NONE. COMPLICATIONS: None immediate. PROCEDURE: Informed written consent was obtained from the patient after a thorough discussion of the procedural risks, benefits and alternatives. All questions were addressed. Maximal Sterile Barrier Technique was utilized including caps, mask, sterile gowns, sterile gloves, sterile drape, hand hygiene and skin antiseptic. A timeout was performed prior to the initiation of the procedure. Patient position prone. Previous imaging reviewed. Left  kidney lower pole was localized and marked. Under sterile conditions and local anesthesia, a 15 gauge coaxial guide needle was advanced to the left kidney lower pole cortex. Needle position confirmed with ultrasound. Images obtained for documentation. 2 16 gauge core biopsies obtained through the access under direct ultrasound of the lower pole cortex. Samples were intact and non fragmented. These were placed in saline. Needle tract occluded with Gel-Foam. Postprocedure imaging demonstrates no hemorrhage or hematoma. Patient tolerated biopsy well. IMPRESSION: Successful ultrasound left kidney core biopsy Electronically Signed   By: Jerilynn Mages.  Shick M.D.   On: 08/22/2021 10:38        Scheduled Meds:  atorvastatin  40 mg Oral Daily   Chlorhexidine Gluconate Cloth  6 each Topical Daily   ezetimibe  10 mg Oral QPC supper   hydrALAZINE  25 mg Oral Q8H   isosorbide mononitrate  60 mg Oral Daily   metolazone  10 mg Oral Daily   sodium chloride flush  3 mL Intravenous Q12H   tamsulosin  0.4 mg Oral Daily   Continuous Infusions:  sodium chloride 250 mL (08/23/21 0640)   sodium chloride 10 mL/hr at 08/18/21 2046   furosemide 160 mg (08/23/21 0645)     LOS: 9 days    Time spent: 32 minutes    Barb Merino, MD [facility] Pager [phone]

## 2021-08-23 NOTE — Progress Notes (Signed)
PT Cancellation Note  Patient Details Name: Corey Beard MRN: [id number] DOB: 03-24-1946   Cancelled Treatment:    Reason Eval/Treat Not Completed: (P) Patient declined, no reason specified Pt refuses PT services today, despite maximal encouragement for participation. Pt has not ambulated with therapy in 6 days and it is quite likely that pt will need SNF level rehab at discharge. PT will follow back tomorrow to progress mobility, however if pt continues to refuses therapy he will be discharged from therapy.   [name] B. Migdalia Dk PT, DPT Acute Rehabilitation Services Pager [phone] Office [phone]   [place] 08/23/2021, 3:14 PM

## 2021-08-24 DIAGNOSIS — E859 Amyloidosis, unspecified: Secondary | ICD-10-CM | POA: Diagnosis not present

## 2021-08-24 DIAGNOSIS — N189 Chronic kidney disease, unspecified: Secondary | ICD-10-CM

## 2021-08-24 DIAGNOSIS — C9000 Multiple myeloma not having achieved remission: Principal | ICD-10-CM

## 2021-08-24 DIAGNOSIS — N049 Nephrotic syndrome with unspecified morphologic changes: Secondary | ICD-10-CM | POA: Diagnosis not present

## 2021-08-24 LAB — ANA W/REFLEX IF POSITIVE: Anti Nuclear Antibody (ANA): NEGATIVE

## 2021-08-24 MED ORDER — HEPARIN SODIUM (PORCINE) 1000 UNIT/ML DIALYSIS: 40.0000 [IU]/kg | INTRAMUSCULAR | Status: DC | PRN

## 2021-08-24 NOTE — Progress Notes (Signed)
I do suspect that he is going to have myeloma.  I does wonder if he has amyloidosis.  He has quite a bit of lambda light chain in his urine.  He did have the bone marrow biopsy done.  The renal biopsy done.  I would not think that the renal biopsy result will be out for about a couple weeks.  There was sent this to [facility] for evaluation.  Not surprisingly, he does have a low erythropoietin level.  I am sure he could responded to Aranesp or Procrit.  The bone survey was unremarkable for bony lesions.  This would be a little unusual.  It be very interesting to see what the cytogenetics will be on the bone marrow.  Again I had believe that he is going to have myeloma.  The question is whether or not there is amyloidosis from the myeloma.  Hopefully, the bone marrow biopsy result will be out today or tomorrow at the latest.  As far as any therapy for this, I would have to suspect that we probably would consider using one of the new monoclonal antibodies-daratumumab, along with Velcade/Decadron.  This is all subcutaneous therapy.  He would not need to have a Port-A-Cath in or need to be given IVs.  That would make life easier for him.  Kerby Nora, MD  2 Cor 3:12

## 2021-08-24 NOTE — Social Work (Signed)
CSW provided pt with transportation resources for HD. CSW will follow up with pt on any further needs.

## 2021-08-24 NOTE — Progress Notes (Signed)
PT Cancellation Note  Patient Details Name: Corey Beard MRN: [id number] DOB: [birth year]   Cancelled Treatment:    Reason Eval/Treat Not Completed: Patient at procedure or test/unavailable. PT at HD treatment. Will plan to follow-up later once pt returns.   Moishe Spice, PT, DPT Acute Rehabilitation Services  Pager: [phone] Office: [place] 08/24/2021, 9:22 AM

## 2021-08-24 NOTE — Progress Notes (Addendum)
PROGRESS NOTE    Corey Beard  DZH:[id number] DOB: 11/06/1946 DOA: 08/14/2021 PCP: Benito Mccreedy, MD    No chief complaint on file.   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 He is found to have bilateral pleural effusions R>L, with recent 2 L thoracentesis performed on 08/07/2021. Cr is up to 3.3 from baseline of 1.2-1.3 a year ago. He has 3+ proteinuria, he was admitted to cardiology service, found to have nephrotic syndrome, now on hospitalist service with nephrology consult  He underwent successful placement of tunneled HD catheter on 08/21/2021. He underwent bone marrow biopsy and renal biopsy on 10/11 and is started on hemodialysis.      Subjective:  500cc urine output documented last 24hrs Had dialysis this morning Remain significant edematous He is on room air at rest, reports feeling too weak to participate with therapy, states he wants to go home when he is ready to be discharged  Assessment & Plan:   Principal Problem:   Nephrotic syndrome Active Problems:   Malignant neoplasm of prostate ([place])   Coronary artery disease   Essential hypertension   Dyslipidemia  Anasarca with bilateral pleural effusions, proteinuria, AKI,  ascites and hypoalbuminemia Appreciate nephrology input Renal biopsy showed lambda AL amyloidosis with significant involvement of glomeruli,tubulointerstitial and vascular regions with moderate to severe tubulo-interstitial fibrosis. Still making urine on iv lasix, oral metolazone Started on dialysis this admission, will likely need long term dialysis, plan per nephrology Oncology consulted, will follow recommendation  Cirrhosis (demonstrated on ultrasound) and possibly secondary to history of Etoh use but NASH and other etiologies also possible Seen by GI, Variceal screening  EGD at some point, can be outpatient   carotid stenosis, nonobstructive CAD,. HTN, HLD Stable on current regimen Asa held due to procedure , will plan to resume asa if no more procedure planned  Deconditioning He is  very weak, he declined PT eval due to weakness, he does not want to go to snf Will need home health   : Body mass index is 32.69 kg/m.Marland Kitchen      Unresulted Labs (From admission, onward)     Start     Ordered   08/23/21 0500  ANA w/Reflex if Positive  Tomorrow morning,   R       Question:  Specimen collection method  Answer:  Lab=Lab collect   08/22/21 1934              DVT prophylaxis: heparin injection 5,000 Units Start: 08/23/21 1000   Code Status:partial  Family Communication: patient Disposition:   Status is: Inpatient   Dispo: The patient is from: home, reports he is the main caregiver to his wife              Anticipated d/c is to: he declined snf, will need home health              Anticipated d/c date is: TBD                Consultants:  Nephro oncology  Procedures:  As above  Antimicrobials:   Anti-infectives (From admission, onward)    Start     Dose/Rate Route Frequency Ordered Stop   08/21/21 1203  ceFAZolin (ANCEF) 2-4 GM/100ML-% IVPB  Status:  Discontinued       Note to Pharmacy: Cecile Sheerer   : cabinet override  08/21/21 1203 08/21/21 1220   08/19/21 0830  cefTRIAXone (ROCEPHIN) 1 g in sodium chloride 0.9 % 100 mL IVPB        1 g 200 mL/hr over 30 Minutes Intravenous Every 24 hours 08/19/21 0739 08/21/21 0843           Objective: Vitals:   08/24/21 1030 08/24/21 1100 08/24/21 1140 08/24/21 1231  BP: (!) 128/56 (!) 143/60 134/69 (!) 140/55  Pulse:   65 66  Resp: _0 Temp:   98.4 F (36.9 C)   TempSrc:   Oral   SpO2:   96% 94%  Weight:   112.4 kg   Height:        Intake/Output Summary (Last 24 hours) at 08/24/2021 1442 Last data filed at 08/24/2021 1123 Gross per 24 hour  Intake 1106.54 ml   Output 3500 ml  Net -2393.46 ml   Filed Weights   08/24/21 0544 08/24/21 0816 08/24/21 1140  Weight: 115 kg 115.4 kg 112.4 kg    Examination:  General exam: alert, awake, communicative,calm, NAD, very frail Respiratory system: diminished,  Respiratory effort normal. Cardiovascular system:  RRR.  Gastrointestinal system: Abdomen is distended, soft and nontender.  Normal bowel sounds heard. Central nervous system: Alert and oriented. No focal neurological deficits. Extremities:  generalized  edema Skin: No rashes, lesions or ulcers Psychiatry: Judgement and insight appear normal. Mood & affect appropriate.     Data Reviewed: I have personally reviewed following labs and imaging studies  CBC: Recent Labs  Lab 08/18/21 0301 08/19/21 0239 08/20/21 0200 08/21/21 0355  WBC 6.5 6.1 5.6 8.4  HGB 9.2* 8.5* 10.0* 9.3*  HCT 28.2* 25.8* 30.4* 28.7*  MCV 87.0 88.1 86.9 88.0  PLT 286 280 334 941    Basic Metabolic Panel: Recent Labs  Lab 08/19/21 0239 08/20/21 0729 08/20/21 1747 08/21/21 0355 08/22/21 0248 08/23/21 0257  NA 135 136  --  137 136 135  K 4.5 5.2* 5.0 4.9 4.6 4.3  CL 103 104  --  104 103 103  CO2 22 21*  --  24 24 21*  GLUCOSE 89 106*  --  85 86 104*  BUN 78* 85*  --  93* 100* 84*  CREATININE 3.96* 4.28*  --  4.80* 4.82* 4.03*  CALCIUM 8.2* 8.4*  --  8.0* 7.8* 7.6*  PHOS  --  7.0*  --  7.2*  --   --     GFR: Estimated Creatinine Clearance: 21.5 mL/min (A) (by C-G formula based on SCr of 4.03 mg/dL (H)).  Liver Function Tests: Recent Labs  Lab 08/18/21 0301 08/19/21 0239 08/20/21 0729 08/21/21 0355  AST 15 16  --   --   ALT 11 10  --   --   ALKPHOS 48 46  --   --   BILITOT 0.6 0.8  --   --   PROT 4.5* 4.3*  --   --   ALBUMIN 2.2*  2.2* 2.2* 2.5* 2.3*    CBG: No results for input(s): GLUCAP in the last 168 hours.   Recent Results (from the past 240 hour(s))  Resp Panel by RT-PCR (Flu A&B, Covid) Nasopharyngeal Swab     Status: None    Collection Time: 08/14/21  7:57 PM   Specimen: Nasopharyngeal Swab; Nasopharyngeal(NP) swabs in vial transport medium  Result Value Ref Range Status   SARS Coronavirus 2 by RT PCR NEGATIVE NEGATIVE Final    Comment: (NOTE) SARS-CoV-2 target nucleic acids are NOT DETECTED.  The SARS-CoV-2 RNA is generally detectable in upper respiratory specimens during the acute phase of infection. The lowest concentration of SARS-CoV-2 viral copies this assay can detect is 138 copies/mL. A negative result does not preclude SARS-Cov-2 infection and should not be used as the sole basis for treatment or other patient management decisions. A negative result may occur with  improper specimen collection/handling, submission of specimen other than nasopharyngeal swab, presence of viral mutation(s) within the areas targeted by this assay, and inadequate number of viral copies(<138 copies/mL). A negative result must be combined with clinical observations, patient history, and epidemiological information. The expected result is Negative.  Fact Sheet for Patients:  EntrepreneurPulse.com.au  Fact Sheet for Healthcare Providers:  IncredibleEmployment.be  This test is no t yet approved or cleared by the [place] FDA and  has been authorized for detection and/or diagnosis of SARS-CoV-2 by FDA under an Emergency Use Authorization (EUA). This EUA will remain  in effect (meaning this test can be used) for the duration of the COVID-19 declaration under Section 564(b)(1) of the Act, 21 U.S.C.section 360bbb-3(b)(1), unless the authorization is terminated  or revoked sooner.       Influenza A by PCR NEGATIVE NEGATIVE Final   Influenza B by PCR NEGATIVE NEGATIVE Final    Comment: (NOTE) The Xpert Xpress SARS-CoV-2/FLU/RSV plus assay is intended as an aid in the diagnosis of influenza from Nasopharyngeal swab specimens and should not be used as a sole basis for treatment.  Nasal washings and aspirates are unacceptable for Xpert Xpress SARS-CoV-2/FLU/RSV testing.  Fact Sheet for Patients: EntrepreneurPulse.com.au  Fact Sheet for Healthcare Providers: IncredibleEmployment.be  This test is not yet approved or cleared by the [place] FDA and has been authorized for detection and/or diagnosis of SARS-CoV-2 by FDA under an Emergency Use Authorization (EUA). This EUA will remain in effect (meaning this test can be used) for the duration of the COVID-19 declaration under Section 564(b)(1) of the Act, 21 U.S.C. section 360bbb-3(b)(1), unless the authorization is terminated or revoked.  Performed at [facility], [place] [address] [place], [place] [address]   Urine Culture     Status: Abnormal   Collection Time: 08/15/21  7:56 AM   Specimen: Urine, Clean Catch  Result Value Ref Range Status   Specimen Description URINE, CLEAN CATCH  Final   Special Requests   Final    Normal Performed at [facility], [place] [address] [place], [place] [address]    Culture MULTIPLE SPECIES PRESENT, SUGGEST RECOLLECTION (A)  Final   Report Status 08/16/2021 FINAL  Final  Urine Culture     Status: Abnormal   Collection Time: 08/17/21  5:24 AM   Specimen: Urine, Clean Catch  Result Value Ref Range Status   Specimen Description URINE, CLEAN CATCH  Final   Special Requests   Final    NONE Performed at [facility], [place] [address] [place], [place] [address]    Culture MULTIPLE SPECIES PRESENT, SUGGEST RECOLLECTION (A)  Final   Report Status 08/18/2021 FINAL  Final  Gram stain     Status: None   Collection Time: 08/18/21  9:27 AM   Specimen: Abdomen; Peritoneal Fluid  Result Value Ref Range Status   Specimen Description PERITONEAL FLUID  Final   Special Requests NONE  Final   Gram Stain   Final    WBC PRESENT, PREDOMINANTLY MONONUCLEAR NO ORGANISMS SEEN CYTOSPIN SMEAR Performed at [facility], [place],  [place] [address]    Report Status 08/18/2021 FINAL  Final  Culture, body fluid w Gram Stain-bottle     Status: None   Collection Time: 08/18/21  9:27 AM   Specimen: Peritoneal Washings  Result Value Ref Range Status   Specimen Description PERITONEAL FLUID  Final   Special Requests NONE  Final   Culture   Final    NO GROWTH 5 DAYS Performed at [facility] [address], [place], [place] [address]    Report Status 08/23/2021 FINAL  Final         Radiology Studies: No results found.      Scheduled Meds:  atorvastatin  40 mg Oral Daily   Chlorhexidine Gluconate Cloth  6 each Topical Daily   ezetimibe  10 mg Oral QPC supper   heparin  5,000 Units Subcutaneous Q8H   hydrALAZINE  25 mg Oral Q8H   isosorbide mononitrate  60 mg Oral Daily   metolazone  10 mg Oral Daily   sodium chloride flush  3 mL Intravenous Q12H   tamsulosin  0.4 mg Oral Daily   Continuous Infusions:  sodium chloride 10 mL/hr at 08/24/21 0012   sodium chloride 10 mL/hr at 08/18/21 2046   furosemide 160 mg (08/24/21 1256)     LOS: 10 days   Time spent: 37mns Greater than 50% of this time was spent in counseling, explanation of diagnosis, planning of further management, and coordination of care.   Voice Recognition /Viviann Sparedictation system was used to create this note, attempts have been made to correct errors. Please contact the author with questions and/or clarifications.   FFlorencia Reasons MD PhD FACP [facility]  Available via Epic secure chat 7am-7pm for nonurgent issues Please page for urgent issues To page the attending provider between 7A-7P or the covering provider during after hours 7P-7A, please log into the web site [email or web address] and access using universal [place] password for that web site. If you do not have the password, please call the hospital operator.    08/24/2021, 2:42 PM

## 2021-08-24 NOTE — Progress Notes (Signed)
Physical Therapy Discharge Patient Details Name: Corey Beard MRN: [id number] DOB: 1946/09/23 Today's Date: 08/24/2021 Time:  -     Patient discharged from PT services secondary to patient has refused 3 (three) consecutive times without medical reason.  Please see latest therapy progress note for current level of functioning and progress toward goals.    Progress and discharge plan discussed with patient and/or caregiver: Patient/Caregiver agrees with plan  GP  Moishe Spice, PT, DPT Acute Rehabilitation Services  Pager: [phone] Office: [place] 08/24/2021, 1:42 PM

## 2021-08-24 NOTE — Progress Notes (Signed)
PT Cancellation Note  Patient Details Name: Corey Beard MRN: [id number] DOB: 18-Jun-1946   Cancelled Treatment:    Reason Eval/Treat Not Completed: Patient declined, no reason specified. Pt continues to refuse PT services again this date. Educated pt that he has not gotten OOB with PT since his PT Eval on 10/5 and that this places him at risk for muscle atrophy, deconditioning, and decreased independence and safety with functional mobility. Pt became agitated immediately upon initiating attempts to encourage/educate pt on importance of mobility and participation in PT. Pt states "I will do what I need to to go home" but refused to mobilize. Pt stated he would like to be taken off PT caseload, will discharge pt per request. Educated pt to notify nursing at a later date if he decides he would like PT and will participate. Per chart, his spouse is in poor health and he lives in a 2-level house, unsure of amount of assistance available to him upon discharge. If pt has 24/7 assistance and can live on the main floor of his home then recommend d/c home with HHPT follow-up provided pt will participate with them. If he does not have the support necessary then recommend he receive short-term rehab at a SNF prior to returning home to maximize his safety and independence with functional mobility since he has very low activity tolerance and would thus have difficulty caring for himself adequately. PT will sign off.   Moishe Spice, PT, DPT Acute Rehabilitation Services  Pager: [phone] Office: [place] 08/24/2021, 1:33 PM

## 2021-08-24 NOTE — Procedures (Signed)
Patient seen on Hemodialysis. BP (!) 158/63   Pulse 71   Temp 98.7 F (37.1 C) (Oral)   Resp 15   Ht 6\' 1"  (1.854 m)   Wt 115.4 kg   SpO2 92%   BMI 33.57 kg/m   QB 300, UF goal 3L Tolerating treatment without complaints at this time.   Elmarie Shiley MD [facility]. Office # [phone] Pager # [phone] 9:52 AM

## 2021-08-24 NOTE — Progress Notes (Signed)
Pt has refused to stand this morning for daily weight. Bed weight obtained for now.

## 2021-08-24 NOTE — Progress Notes (Signed)
OT Cancellation Note  Patient Details Name: Corey Beard MRN: [id number] DOB: [birth year]   Cancelled Treatment:    Reason Eval/Treat Not Completed: Patient at procedure or test/ unavailable (HD)  Trixie Dredge 08/24/2021, 9:04 AM Lodema Hong, OTA

## 2021-08-24 NOTE — Progress Notes (Addendum)
Referral completed to [facility] admissions this am. Pt to need transportation to/from HD and provided this information to [facility] staff this am. Will follow and assist.    Melven Sartorius Renal Navigator [phone]  Addendum at 4:25 pm: Received pt's out-pt HD arrangements this afternoon. Pt has been approved for [facility] on TTS schedule. Pt will need to arrive at 11:25 for 11:45 chair time. Pt will need to arrive at 10:45 for his first treatment to complete paperwork. Will discuss the above with pt in the am.

## 2021-08-24 NOTE — TOC Progression Note (Signed)
Transition of Care [facility]) - Progression Note    Patient Details  Name: Corey Beard MRN: [id number] Date of Birth: 08-Jun-1946  [facility] CM/SW Contact  Zenon Mayo, RN Phone Number: 08/24/2021, 4:18 PM  Clinical Narrative:      Per Renal Navigator , Olivia Mackie, He will be going to [place] on TTS. he will need to arrive at 11:25 for 11:45 chair time. on his first appointment he will need to arrive at 10:45 for paperwork.  CSW Leroy Kennedy will assist patient in am with transportation.   Expected Discharge Plan: [place] Barriers to Discharge: Continued Medical Work up  Expected Discharge Plan and Services Expected Discharge Plan: [place] In-house Referral: NA Discharge Planning Services: CM Consult Post Acute Care Choice: [place] arrangements for the past 2 months: Single Family Home                 DME Arranged:  (following for DME needs- daughter asked about a hospital bed.)         HH Arranged: OT, PT HH Agency: [place] Date [facility] Agency Contacted: 08/22/21 Time [place]: 1250 Representative spoke with at [place]: [place] ([place]) Interventions    Readmission Risk Interventions No flowsheet data found.

## 2021-08-24 NOTE — Progress Notes (Signed)
Patient ID: Corey Beard, male   DOB: Nov 20, 1945, 75 y.o.   MRN: [id number] [facility] Progress Note   Assessment/ Plan:   1. Acute kidney Injury: Earlier this year with creatinine of about 1.3 with significant rise over the last few weeks.  Renal biopsy showed lambda AL amyloidosis with significant involvement of glomeruli,tubulointerstitial and vascular regions with moderate to severe tubulo-interstitial fibrosis. The latter clearly portends a poor renal prognosis and I suspect that he will need long term dialysis- this is his 3rd treatment today and the process was started for OP HD unit placement yesterday.  2.  Anasarca: Secondary to worsening renal function/nephrotic syndrome from amyloidosis. Hepatic and cardiac involvement in amyloidosis likely are contributory. Continue HD/UF.  3.  Urinary tract infection: Cultures noted and currently on ceftriaxone. 4.  Anemia of chronic illness: Without overt blood loss, will continue to follow hemoglobin and hematocrit closely to decide on need for ESA versus transfusion. 5. AL Amyloidosis: I have shared with Dr. Marin Olp the results of Dr. Waldon Reining renal biopsy for therapeutic decisions- possibly velcade/dexamethasone.   Subjective:   No acute events overnight and tolerated dialysis without problems. I shared with him the renal biopsy results obtained from [facility].    Objective:   BP (!) 158/63   Pulse 71   Temp 98.7 F (37.1 C) (Oral)   Resp 15   Ht $R'6\' 1"'Dn$  (1.854 m)   Wt 115.4 kg   SpO2 92%   BMI 33.57 kg/m   Intake/Output Summary (Last 24 hours) at 08/24/2021 0954 Last data filed at 08/24/2021 0546 Gross per 24 hour  Intake 1106.54 ml  Output 2500 ml  Net -1393.46 ml   Weight change: -4.4 kg  Physical Exam: Gen: Appears comfortable resting in dialysis CVS: Pulse regular rhythm, normal rate, S1 and S2 normal Resp: Decreased breath sounds over bilateral bases, no distinct rhonchi or rales.  Right IJ TDC connected to  HD Abd: Soft, obese, nontender Ext: 3+ anasarca with areas of skin denudation over both antecubital surfaces  Imaging: CT BONE MARROW BIOPSY & ASPIRATION  Result Date: 08/22/2021 INDICATION: Monoclonal gammopathy of unknown significance, amyloidosis EXAM: CT GUIDED LEFT ILIAC BONE MARROW ASPIRATION AND CORE BIOPSY Date:  08/22/2021 08/22/2021 10:32 am Radiologist:  Jerilynn Mages. Daryll Brod, MD Guidance:  CT FLUOROSCOPY TIME:  Fluoroscopy Time: None. MEDICATIONS: 1% lidocaine local ANESTHESIA/SEDATION: 1.0 mg IV Versed; 80 mcg IV Fentanyl Moderate Sedation Time:  27 minute The patient was continuously monitored during the procedure by the interventional radiology nurse under my direct supervision. CONTRAST:  None. COMPLICATIONS: None PROCEDURE: Informed consent was obtained from the patient following explanation of the procedure, risks, benefits and alternatives. The patient understands, agrees and consents for the procedure. All questions were addressed. A time out was performed. The patient was positioned prone and non-contrast localization CT was performed of the pelvis to demonstrate the iliac marrow spaces. Maximal barrier sterile technique utilized including caps, mask, sterile gowns, sterile gloves, large sterile drape, hand hygiene, and Betadine prep. Under sterile conditions and local anesthesia, an 11 gauge coaxial bone biopsy needle was advanced into the left iliac marrow space. Needle position was confirmed with CT imaging. Initially, bone marrow aspiration was performed. Next, the 11 gauge outer cannula was utilized to obtain a left iliac bone marrow core biopsy. Needle was removed. Hemostasis was obtained with compression. The patient tolerated the procedure well. Samples were prepared with the cytotechnologist. No immediate complications. IMPRESSION: CT guided left iliac bone marrow aspiration and core  biopsy. Electronically Signed   By: Jerilynn Mages.  Shick M.D.   On: 08/22/2021 10:35   CT RENAL  BIOPSY  Result Date: 08/22/2021 INDICATION: Renal failure, nephrotic syndrome, concern for amyloidosis EXAM: ULTRASOUND LEFT RENAL RANDOM CORE BIOPSY MEDICATIONS: 1% LIDOCAINE LOCAL ANESTHESIA/SEDATION: Moderate (conscious) sedation was employed during this procedure. A total of Versed 1 mg and Fentanyl 80 mcg was administered intravenously. Moderate Sedation Time: 27 minutes. The patient's level of consciousness and vital signs were monitored continuously by radiology nursing throughout the procedure under my direct supervision. FLUOROSCOPY TIME:  Fluoroscopy Time: NONE. COMPLICATIONS: None immediate. PROCEDURE: Informed written consent was obtained from the patient after a thorough discussion of the procedural risks, benefits and alternatives. All questions were addressed. Maximal Sterile Barrier Technique was utilized including caps, mask, sterile gowns, sterile gloves, sterile drape, hand hygiene and skin antiseptic. A timeout was performed prior to the initiation of the procedure. Patient position prone. Previous imaging reviewed. Left kidney lower pole was localized and marked. Under sterile conditions and local anesthesia, a 15 gauge coaxial guide needle was advanced to the left kidney lower pole cortex. Needle position confirmed with ultrasound. Images obtained for documentation. 2 16 gauge core biopsies obtained through the access under direct ultrasound of the lower pole cortex. Samples were intact and non fragmented. These were placed in saline. Needle tract occluded with Gel-Foam. Postprocedure imaging demonstrates no hemorrhage or hematoma. Patient tolerated biopsy well. IMPRESSION: Successful ultrasound left kidney core biopsy Electronically Signed   By: Jerilynn Mages.  Shick M.D.   On: 08/22/2021 10:38    Labs: BMET Recent Labs  Lab 08/18/21 0301 08/19/21 0239 08/20/21 0729 08/20/21 1747 08/21/21 0355 08/22/21 0248 08/23/21 0257  NA 135 135 136  --  137 136 135  K 4.8 4.5 5.2* 5.0 4.9 4.6 4.3   CL 104 103 104  --  104 103 103  CO2 22 22 21*  --  24 24 21*  GLUCOSE 89 89 106*  --  85 86 104*  BUN 74* 78* 85*  --  93* 100* 84*  CREATININE 4.06* 3.96* 4.28*  --  4.80* 4.82* 4.03*  CALCIUM 8.3* 8.2* 8.4*  --  8.0* 7.8* 7.6*  PHOS  --   --  7.0*  --  7.2*  --   --    CBC Recent Labs  Lab 08/18/21 0301 08/19/21 0239 08/20/21 0200 08/21/21 0355  WBC 6.5 6.1 5.6 8.4  HGB 9.2* 8.5* 10.0* 9.3*  HCT 28.2* 25.8* 30.4* 28.7*  MCV 87.0 88.1 86.9 88.0  PLT 286 280 334 312    Medications:     atorvastatin  40 mg Oral Daily   Chlorhexidine Gluconate Cloth  6 each Topical Daily   ezetimibe  10 mg Oral QPC supper   heparin  5,000 Units Subcutaneous Q8H   hydrALAZINE  25 mg Oral Q8H   isosorbide mononitrate  60 mg Oral Daily   metolazone  10 mg Oral Daily   sodium chloride flush  3 mL Intravenous Q12H   tamsulosin  0.4 mg Oral Daily   Elmarie Shiley, MD 08/24/2021, 9:54 AM

## 2021-08-24 NOTE — Progress Notes (Signed)
OT Cancellation Note  Patient Details Name: ALDRICH LLOYD MRN: [id number] DOB: 1946-02-24   Cancelled Treatment:    Reason Eval/Treat Not Completed: Patient declined, no reason specified (Patient was attempted again by OT with patient being adament he did not want to participate in therapy today.  Patient states he was too tired to participate.)  [place], OTA  08/24/2021, 1:35 PM

## 2021-08-25 DIAGNOSIS — N189 Chronic kidney disease, unspecified: Secondary | ICD-10-CM | POA: Diagnosis not present

## 2021-08-25 DIAGNOSIS — Z992 Dependence on renal dialysis: Secondary | ICD-10-CM | POA: Diagnosis not present

## 2021-08-25 DIAGNOSIS — C9000 Multiple myeloma not having achieved remission: Secondary | ICD-10-CM | POA: Diagnosis not present

## 2021-08-25 DIAGNOSIS — N049 Nephrotic syndrome with unspecified morphologic changes: Secondary | ICD-10-CM | POA: Diagnosis not present

## 2021-08-25 DIAGNOSIS — E859 Amyloidosis, unspecified: Secondary | ICD-10-CM | POA: Diagnosis not present

## 2021-08-25 LAB — MAGNESIUM: Magnesium: 2.2 mg/dL (ref 1.7–2.4)

## 2021-08-25 LAB — CBC
HCT: 30.1 % — ABNORMAL LOW (ref 39.0–52.0)
Hemoglobin: 10 g/dL — ABNORMAL LOW (ref 13.0–17.0)
MCH: 29 pg (ref 26.0–34.0)
MCHC: 33.2 g/dL (ref 30.0–36.0)
MCV: 87.2 fL (ref 80.0–100.0)
Platelets: 228 10*3/uL (ref 150–400)
RBC: 3.45 MIL/uL — ABNORMAL LOW (ref 4.22–5.81)
RDW: 15.5 % (ref 11.5–15.5)
WBC: 8.6 10*3/uL (ref 4.0–10.5)
nRBC: 0 % (ref 0.0–0.2)

## 2021-08-25 LAB — BASIC METABOLIC PANEL
Anion gap: 11 (ref 5–15)
BUN: 54 mg/dL — ABNORMAL HIGH (ref 8–23)
CO2: 22 mmol/L (ref 22–32)
Calcium: 8 mg/dL — ABNORMAL LOW (ref 8.9–10.3)
Chloride: 103 mmol/L (ref 98–111)
Creatinine, Ser: 3.28 mg/dL — ABNORMAL HIGH (ref 0.61–1.24)
GFR, Estimated: 19 mL/min — ABNORMAL LOW (ref >60)
Glucose, Bld: 80 mg/dL (ref 70–99)
Potassium: 4.6 mmol/L (ref 3.5–5.1)
Sodium: 136 mmol/L (ref 135–145)

## 2021-08-25 LAB — BODY FLUID CELL COUNT WITH DIFFERENTIAL
Lymphs, Fluid: 51 %
Monocyte-Macrophage-Serous Fluid: 48 % — ABNORMAL LOW (ref 50–90)
Neutrophil Count, Fluid: 1 % (ref 0–25)
Total Nucleated Cell Count, Fluid: 91 cu mm (ref 0–1000)

## 2021-08-25 LAB — SURGICAL PATHOLOGY

## 2021-08-25 MED ORDER — CHLORHEXIDINE GLUCONATE CLOTH 2 % EX PADS
6.0000 | MEDICATED_PAD | Freq: Every day | CUTANEOUS | Status: DC
  Administered 2021-08-27 – 2021-09-01 (×5): 6 via TOPICAL

## 2021-08-25 NOTE — Progress Notes (Deleted)
    Durable Medical Equipment  (From admission, onward)           Start     Ordered   08/25/21 1546  For home use only DME Hospital bed  Once       Question Answer Comment  Length of Need Lifetime   Patient has (list medical condition): CHF   The above medical condition requires: Patient requires the ability to reposition frequently   Head must be elevated greater than: 30 degrees   Bed type Semi-electric   Support Surface: Gel Overlay      08/25/21 1547

## 2021-08-25 NOTE — Progress Notes (Signed)
    Durable Medical Equipment  (From admission, onward)           Start     Ordered   08/25/21 1550  For home use only DME Hospital bed  Once       Question Answer Comment  Length of Need Lifetime   Patient has (list medical condition): pl effusion   The above medical condition requires: Patient requires the ability to reposition frequently   Head must be elevated greater than: 30 degrees   Bed type Semi-electric   Support Surface: Gel Overlay      08/25/21 1549

## 2021-08-25 NOTE — TOC Progression Note (Addendum)
Transition of Care [facility]) - Progression Note    Patient Details  Name: Corey Beard MRN: [id number] Date of Birth: [birth year]  Transition of [facility]) CM/SW Contact  Zenon Mayo, RN Phone Number: 08/25/2021, 3:52 PM  Clinical Narrative:    NCM spoke with daughter, Corey Beard [phone], she states that he will need hospital bed because he has 17 steps to go up and if he can not go up the steps he will need the hospital bed, she is ok with [employer] supplying this.  She would like the bed to be delivered after Wed.  He is set up with [facility] for [facility].  Daughter will not be back in town until Tuesday.   Expected Discharge Plan: [place] Barriers to Discharge: Continued Medical Work up  Expected Discharge Plan and Services Expected Discharge Plan: [place] In-house Referral: NA Discharge Planning Services: CM Consult Post Acute Care Choice: [place] arrangements for the past 2 months: Single Family Home                 DME Arranged:  (following for DME needs- daughter asked about a hospital bed.)         HH Arranged: OT, PT HH Agency: [place] Date [facility] Contacted: 08/22/21 Time [place]: 1250 Representative spoke with at [place]: [place] ([place]) Interventions    Readmission Risk Interventions No flowsheet data found.

## 2021-08-25 NOTE — Progress Notes (Signed)
Met with pt at bedside to discuss out-pt HD arrangements. Pt provided resource sheet with clinic name,address, phone number, and days/time documented. Pt has been approved for [place] on TTS schedule. Pt will need to arrive at 11:25 for 11:45 chair time. Pt will need to arrive at 10:45 for his first treatment to complete paperwork.Pt aware of this info and agreeable to plan. Offered to contact a family member to discuss arrangements and pt declined. Will f/u with clinic once d/c date is known. Will follow and assist.    Melven Sartorius Renal Navigator [phone]

## 2021-08-25 NOTE — Plan of Care (Signed)
  Problem: Respiratory: Goal: Respiratory symptoms related to disease process will be avoided Outcome: Completed/Met   Problem: Nutritional: Goal: Ability to make appropriate dietary choices will improve Outcome: Completed/Met

## 2021-08-25 NOTE — Progress Notes (Signed)
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 PMH: HTN, DM2, prostate ca, tobacco use, carotid stenosis, non obstructive CAD, glaucoma, arthritis and MI.   OT comments  Pt in good spirits, up in chair. States he is remorseful regarding conversation with PT yesterday, he was not feeling up to mobilizing. Pt states he is taking one day at a time. Completed seated grooming and transferred to [facility] with set up to min assist. He is refusing a hospital bed at home, educated on benefits. Pt wants to consider AE for LB ADL closer to discharge.    Recommendations for follow up therapy are one component of a multi-disciplinary discharge planning process, led by the attending physician.  Recommendations may be updated based on patient status, additional functional criteria and insurance authorization.    Follow Up Recommendations  Home health OT    Equipment Recommendations  None recommended by OT    Recommendations for Other Services      Precautions / Restrictions Precautions Precautions: Fall       Mobility Bed Mobility               General bed mobility comments: pt in chair    Transfers Overall transfer level: Needs assistance Equipment used: Rolling walker (2 wheeled) Transfers: Sit to/from [facility] Sit to Stand: Min assist Stand pivot transfers: Min guard       General transfer comment: min assist to rise and steady, increased time    Balance Overall balance assessment: Needs assistance   Sitting balance-Leahy Scale: Good     Standing balance support: Bilateral upper extremity supported   Standing balance comment: reliant on RW for transfer                           ADL either performed or  assessed with clinical judgement   ADL Overall ADL's : Needs assistance/impaired     Grooming: Set up;Sitting;Oral care           Upper Body Dressing : Set up;Sitting       Toilet Transfer: Min guard;Stand-pivot;RW;BSC   Toileting- Clothing Manipulation and Hygiene: Minimal assistance;Sit to/from stand         General ADL Comments: Pt does not want to consider AE for LB ADL until he sees whether his edema is reduced prior to going home.     Vision       Perception     Praxis      Cognition Arousal/Alertness: Awake/alert Behavior During Therapy: WFL for tasks assessed/performed;Flat affect Overall Cognitive Status: Within Functional Limits for tasks assessed                                          Exercises     Shoulder Instructions       General Comments      Pertinent Vitals/ Pain       Pain Assessment: Faces Faces Pain Scale: Hurts little more Pain Location: generalized Pain Descriptors / Indicators: Grimacing;Discomfort Pain Intervention(s): Monitored during session;Repositioned  Home Living  Prior Functioning/Environment              Frequency  Min 2X/week        Progress Toward Goals  OT Goals(current goals can now be found in the care plan section)  Progress towards OT goals: Progressing toward goals  Acute Rehab OT Goals Patient Stated Goal: return home OT Goal Formulation: With patient Time For Goal Achievement: 08/30/21 Potential to Achieve Goals: Good  Plan Discharge plan remains appropriate    Co-evaluation                 AM-PAC OT "6 Clicks" Daily Activity     Outcome Measure   Help from another person eating meals?: None Help from another person taking care of personal grooming?: A Little Help from another person toileting, which includes using toliet, bedpan, or urinal?: A Little Help from another person bathing (including  washing, rinsing, drying)?: A Lot Help from another person to put on and taking off regular upper body clothing?: A Little Help from another person to put on and taking off regular lower body clothing?: A Lot 6 Click Score: 17    End of Session Equipment Utilized During Treatment: Rolling walker  OT Visit Diagnosis: Other abnormalities of gait and mobility (R26.89)   Activity Tolerance Patient tolerated treatment well   Patient Left in chair;with call bell/phone within reach;with family/visitor present   Nurse Communication          Time: 3734-2876 OT Time Calculation (min): 22 min  Charges: OT General Charges $OT Visit: 1 Visit OT Treatments $Self Care/Home Management : 8-22 mins  Nestor Lewandowsky, OTR/L Acute Rehabilitation Services Pager: [phone] Office: [phone]  Malka So 08/25/2021, 10:10 AM

## 2021-08-25 NOTE — Social Work (Signed)
Per renal navigator pt will be going to [place] on TTS, he will need to arrive at 11:25 for 11:45 chair time. on his first appointment he will need to arrive at 10:45 for paperwork.   CSW is working with pt to set up transport.

## 2021-08-25 NOTE — Progress Notes (Signed)
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 He is found to have bilateral pleural effusions R>L, with recent 2 L thoracentesis performed on 08/07/2021. Cr  on admission up at 3.3 from baseline of 1.2-1.3 a year ago. He has 3+ proteinuria. He was initially admitted by cardiology, transferred to [facility].  Nephrology consulted for evaluation of nephrotic syndrome.  He underwent successful placement of tunneled HD catheter on 08/21/2021. He underwent bone marrow biopsy and renal biopsy on 10/11 and started on hemodialysis.   Bone marrow biopsy consistent with myeloma.  Kidney biopsy with amyloidosis.  Oncology suggested picture consistent with lambda light chain myeloma.   Assessment & Plan:   Principal Problem:   Nephrotic syndrome Active Problems:   Malignant neoplasm of prostate (HCC)   Coronary artery disease   Essential hypertension   Dyslipidemia  Anasarca with bilateral pleural effusions/proteinuria/acute kidney injury ascites and hypoalbuminemia: Nephrotic syndrome secondary to amyloidosis. Echocardiogram was essentially normal.  Ultrasound of the kidneys normal-appearing kidneys with exception of left lower pole renal cyst. Due to significant uremia and fluid overload, started on hemodialysis 10/11, 10/12.  Patient is now on a scheduled hemodialysis TTS schedule. Along with hemodialysis, patient is on high-dose IV Lasix 160 mg 3 times daily with metolazone to try aggressive diuresis. 100 mL paracentesis, negative cultures.  Lambda light chain myeloma: Seen by oncology.  Recommended inpatient chemotherapy possibly Monday.  Followed by Dr. Marin Olp.  Essential hypertension: Blood pressure optimal controlled on  hydralazine, Imdur and Lasix.  Coronary artery disease: Currently asymptomatic.  BPH: On Flomax.  Abnormal urinalysis: Final culture with multiple species.  Completed antibiotic therapy.  DVT prophylaxis: heparin injection 5,000 Units Start: 08/23/21 1000  Heparin subcu   Code Status: Partial Family Communication: None.  Patient is communicating with family. Disposition Plan: Status is: Inpatient  Remains inpatient appropriate because:IV treatments appropriate due to intensity of illness or inability to take PO and Inpatient level of care appropriate due to severity of illness  Dispo: The patient is from: Home              Anticipated d/c is to: Home              Patient currently is not medically stable to d/c.   Difficult to place patient No         Consultants:  Cardiology Nephrology  Procedures:  Renal biopsy, bone marrow biopsy 10/11 Right permacath 10/10.  Antimicrobials:  Rocephin.  Completed antibiotics.   Subjective: Patient seen and examined.  Still has some wet cough otherwise denies any complaints.  He still edematous.  Objective: Vitals:   08/24/21 1140 08/24/21 1231 08/24/21 1946 08/25/21 0541  BP: 134/69 (!) 140/55 (!) 116/59 (!) 123/56  Pulse: 65 66 75 78  Resp: _0 Temp: 98.4 F (36.9 C)  97.8 F (36.6 C) 98 F (36.7 C)  TempSrc: Oral  Oral Oral  SpO2: 96% 94% 96% 96%  Weight: 112.4 kg   111.6 kg  Height:        Intake/Output Summary (Last 24 hours) at 08/25/2021 1118 Last data filed at 08/25/2021 0930 Gross per 24 hour  Intake 989 ml  Output 3525 ml  Net -2536 ml   Filed Weights   08/24/21  0254 08/24/21 1140 08/25/21 0541  Weight: 115.4 kg 112.4 kg 111.6 kg    Examination:  General exam: Appears calm and comfortable , on room air.  Sitting in couch. Anasarca , right upper extremity is more swollen than left. Respiratory system: Clear to auscultation. Respiratory effort normal.  Upper airway sounds. Right IJ  permacath present  Cardiovascular system: S1 & S2 heard, RRR. No JVD, murmurs, rubs, gallops or clicks. No pedal edema. Gastrointestinal system: Abdomen is nondistended, soft and nontender. No organomegaly or masses felt. Normal bowel sounds heard. Central nervous system: Alert and oriented. No focal neurological deficits.   Data Reviewed: I have personally reviewed following labs and imaging studies  CBC: Recent Labs  Lab 08/19/21 0239 08/20/21 0200 08/21/21 0355 08/25/21 0318  WBC 6.1 5.6 8.4 8.6  HGB 8.5* 10.0* 9.3* 10.0*  HCT 25.8* 30.4* 28.7* 30.1*  MCV 88.1 86.9 88.0 87.2  PLT 280 334 312 270   Basic Metabolic Panel: Recent Labs  Lab 08/20/21 0729 08/20/21 1747 08/21/21 0355 08/22/21 0248 08/23/21 0257 08/25/21 0318  NA 136  --  137 136 135 136  K 5.2* 5.0 4.9 4.6 4.3 4.6  CL 104  --  104 103 103 103  CO2 21*  --  24 24 21* 22  GLUCOSE 106*  --  85 86 104* 80  BUN 85*  --  93* 100* 84* 54*  CREATININE 4.28*  --  4.80* 4.82* 4.03* 3.28*  CALCIUM 8.4*  --  8.0* 7.8* 7.6* 8.0*  MG  --   --   --   --   --  2.2  PHOS 7.0*  --  7.2*  --   --   --    GFR: Estimated Creatinine Clearance: 26.3 mL/min (A) (by C-G formula based on SCr of 3.28 mg/dL (H)). Liver Function Tests: Recent Labs  Lab 08/19/21 0239 08/20/21 0729 08/21/21 0355  AST 16  --   --   ALT 10  --   --   ALKPHOS 46  --   --   BILITOT 0.8  --   --   PROT 4.3*  --   --   ALBUMIN 2.2* 2.5* 2.3*   No results for input(s): LIPASE, AMYLASE in the last 168 hours. No results for input(s): AMMONIA in the last 168 hours. Coagulation Profile: Recent Labs  Lab 08/21/21 0355  INR 1.0   Cardiac Enzymes: No results for input(s): CKTOTAL, CKMB, CKMBINDEX, TROPONINI in the last 168 hours. BNP (last 3 results) No results for input(s): PROBNP in the last 8760 hours. HbA1C: No results for input(s): HGBA1C in the last 72 hours. CBG: No results for input(s): GLUCAP in the last 168 hours. Lipid Profile: No  results for input(s): CHOL, HDL, LDLCALC, TRIG, CHOLHDL, LDLDIRECT in the last 72 hours. Thyroid Function Tests: No results for input(s): TSH, T4TOTAL, FREET4, T3FREE, THYROIDAB in the last 72 hours. Anemia Panel: No results for input(s): VITAMINB12, FOLATE, FERRITIN, TIBC, IRON, RETICCTPCT in the last 72 hours. Sepsis Labs: No results for input(s): PROCALCITON, LATICACIDVEN in the last 168 hours.  Recent Results (from the past 240 hour(s))  Urine Culture     Status: Abnormal   Collection Time: 08/17/21  5:24 AM   Specimen: Urine, Clean Catch  Result Value Ref Range Status   Specimen Description URINE, CLEAN CATCH  Final   Special Requests   Final    NONE Performed at [facility], [address] [place], [place] [address]  Culture MULTIPLE SPECIES PRESENT, SUGGEST RECOLLECTION (A)  Final   Report Status 08/18/2021 FINAL  Final  Gram stain     Status: None   Collection Time: 08/18/21  9:27 AM   Specimen: Abdomen; Peritoneal Fluid  Result Value Ref Range Status   Specimen Description PERITONEAL FLUID  Final   Special Requests NONE  Final   Gram Stain   Final    WBC PRESENT, PREDOMINANTLY MONONUCLEAR NO ORGANISMS SEEN CYTOSPIN SMEAR Performed at [facility], [place] [address] [place], [place] [address]    Report Status 08/18/2021 FINAL  Final  Culture, body fluid w Gram Stain-bottle     Status: None   Collection Time: 08/18/21  9:27 AM   Specimen: Peritoneal Washings  Result Value Ref Range Status   Specimen Description PERITONEAL FLUID  Final   Special Requests NONE  Final   Culture   Final    NO GROWTH 5 DAYS Performed at [place] [address] [place], [place] [address]    Report Status 08/23/2021 FINAL  Final         Radiology Studies: No results found.      Scheduled Meds:  atorvastatin  40 mg Oral Daily   Chlorhexidine Gluconate Cloth  6 each Topical Daily   Chlorhexidine Gluconate Cloth  6 each Topical Q0600   ezetimibe  10  mg Oral QPC supper   heparin  5,000 Units Subcutaneous Q8H   hydrALAZINE  25 mg Oral Q8H   isosorbide mononitrate  60 mg Oral Daily   sodium chloride flush  3 mL Intravenous Q12H   tamsulosin  0.4 mg Oral Daily   Continuous Infusions:  sodium chloride 10 mL/hr at 08/24/21 0012   sodium chloride 10 mL/hr at 08/18/21 2046     LOS: 11 days    Time spent: 32 minutes    Barb Merino, MD [facility] Pager [phone]

## 2021-08-25 NOTE — Progress Notes (Signed)
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  The bone marrow biopsy shows light chain restriction with lambda light chain.  The kidney biopsy shows that he has amyloidosis.  This is all consistent with his lambda light chain myeloma.  His monoclonal spike is only 0.2 g/dL.  As such, I doubt that he has a increase in heavy chains.  Unfortunately, I do not think that even with myeloma treatment, his kidneys will get much better.  I think he is on dialysis right now.  I think that the best treatment option for him would be a combination of Velcade, Cytoxan, and daratumumab.  Unfortunately, I was sure this can be done in the hospital.  I think the thing that can be done in the hospital would be the Velcade.  I would not think he can be moved over to [facility] long hospital since he is getting dialysis.  I talked him about this.  He is familiar with therapy for myeloma.  He understands that we can treat the myeloma but his kidneys likely will not improve.  Maybe, we can get some Treatment started on Monday.  Again it is very difficult to treat over it [facility].  His labs today show white cell count 8.6.  Hemoglobin 10 hematocrit 30.1.  Platelet count 220,000.  He  does have a low erythropoietin level of only 14.  Such, he would respond to ESA.  We will have to see if we cannot get some chemotherapy gone next week.  Lattie Haw, MD  [place] [phone]

## 2021-08-25 NOTE — Progress Notes (Signed)
[facility] NEPHROLOGY PROGRESS NOTE  Assessment/ Plan:  #Acute kidney Injury: Earlier this year with creatinine of about 1.3 with significant rise over the last few weeks.  Renal biopsy showed lambda AL amyloidosis with significant involvement of glomeruli,tubulointerstitial and vascular regions with moderate to severe tubulo-interstitial fibrosis.  The biopsy finding carries a poor prognosis and I think he will be dialysis dependent.  Renal navigator already following to arrange outpatient HD.  He has tunneled HD catheter for the access. Status post third HD yesterday and tolerated well.  We will plan for next HD tomorrow.   # AL Amyloidosis: Dr. Marin Olp is following and noted plan for combination of Velcade, Cytoxan and daratumumab, presumably starting early next week.  # Anasarca: Secondary to worsening renal function/nephrotic syndrome from amyloidosis. Hepatic and cardiac involvement in amyloidosis likely are contributory. Continue HD/UF.  Discontinue IV Lasix and metolazone.  #  Urinary tract infection: Treated with ceftriaxone.  #  Anemia of chronic illness: Without overt blood loss, will continue to follow hemoglobin and hematocrit closely to decide on need for ESA versus transfusion.  Subjective: Seen and examined at bedside.  He had third HD yesterday with around 3 L of UF, tolerated well.  He denies nausea, vomiting, chest pain, shortness of breath.  Urine output noted 525 cc on high-dose of IV diuretics and metolazone. Objective Vital signs in last 24 hours: Vitals:   08/24/21 1140 08/24/21 1231 08/24/21 1946 08/25/21 0541  BP: 134/69 (!) 140/55 (!) 116/59 (!) 123/56  Pulse: 65 66 75 78  Resp: 15 20 16 17   Temp: 98.4 F (36.9 C)  97.8 F (36.6 C) 98 F (36.7 C)  TempSrc: Oral  Oral Oral  SpO2: 96% 94% 96% 96%  Weight: 112.4 kg   111.6 kg  Height:       Weight change: -1.4 kg  Intake/Output Summary (Last 24 hours) at 08/25/2021 0818 Last data filed at  08/25/2021 1093 Gross per 24 hour  Intake 749 ml  Output 3525 ml  Net -2776 ml       Labs: Basic Metabolic Panel: Recent Labs  Lab 08/20/21 0729 08/20/21 1747 08/21/21 0355 08/22/21 0248 08/23/21 0257 08/25/21 0318  NA 136  --  137 136 135 136  K 5.2*   < > 4.9 4.6 4.3 4.6  CL 104  --  104 103 103 103  CO2 21*  --  24 24 21* 22  GLUCOSE 106*  --  85 86 104* 80  BUN 85*  --  93* 100* 84* 54*  CREATININE 4.28*  --  4.80* 4.82* 4.03* 3.28*  CALCIUM 8.4*  --  8.0* 7.8* 7.6* 8.0*  PHOS 7.0*  --  7.2*  --   --   --    < > = values in this interval not displayed.   Liver Function Tests: Recent Labs  Lab 08/19/21 0239 08/20/21 0729 08/21/21 0355  AST 16  --   --   ALT 10  --   --   ALKPHOS 46  --   --   BILITOT 0.8  --   --   PROT 4.3*  --   --   ALBUMIN 2.2* 2.5* 2.3*   No results for input(s): LIPASE, AMYLASE in the last 168 hours. No results for input(s): AMMONIA in the last 168 hours. CBC: Recent Labs  Lab 08/19/21 0239 08/20/21 0200 08/21/21 0355 08/25/21 0318  WBC 6.1 5.6 8.4 8.6  HGB 8.5* 10.0* 9.3* 10.0*  HCT 25.8*  30.4* 28.7* 30.1*  MCV 88.1 86.9 88.0 87.2  PLT 280 334 312 228   Cardiac Enzymes: No results for input(s): CKTOTAL, CKMB, CKMBINDEX, TROPONINI in the last 168 hours. CBG: No results for input(s): GLUCAP in the last 168 hours.  Iron Studies: No results for input(s): IRON, TIBC, TRANSFERRIN, FERRITIN in the last 72 hours. Studies/Results: No results found.  Medications: Infusions:  sodium chloride 10 mL/hr at 08/24/21 0012   sodium chloride 10 mL/hr at 08/18/21 2046   furosemide 160 mg (08/25/21 8466)    Scheduled Medications:  atorvastatin  40 mg Oral Daily   Chlorhexidine Gluconate Cloth  6 each Topical Daily   ezetimibe  10 mg Oral QPC supper   heparin  5,000 Units Subcutaneous Q8H   hydrALAZINE  25 mg Oral Q8H   isosorbide mononitrate  60 mg Oral Daily   metolazone  10 mg Oral Daily   sodium chloride flush  3 mL  Intravenous Q12H   tamsulosin  0.4 mg Oral Daily    have reviewed scheduled and prn medications.  Physical Exam: General:NAD, comfortable Heart:RRR, s1s2 nl Lungs: Bilateral basal reduced breath sound, no crackle appreciated Abdomen:soft, Non-tender Extremities: Anasarca with edematous both upper and lower extremities. Dialysis Access: Right IJ TDC in place.  Corey Beard Corey Beard Corey Beard 08/25/2021,8:18 AM  LOS: 11 days

## 2021-08-26 ENCOUNTER — Encounter (HOSPITAL_COMMUNITY): Payer: Self-pay | Admitting: Internal Medicine

## 2021-08-26 DIAGNOSIS — N08 Glomerular disorders in diseases classified elsewhere: Secondary | ICD-10-CM

## 2021-08-26 DIAGNOSIS — C9000 Multiple myeloma not having achieved remission: Secondary | ICD-10-CM

## 2021-08-26 DIAGNOSIS — Z7189 Other specified counseling: Secondary | ICD-10-CM

## 2021-08-26 DIAGNOSIS — E8581 Light chain (AL) amyloidosis: Secondary | ICD-10-CM

## 2021-08-26 DIAGNOSIS — N049 Nephrotic syndrome with unspecified morphologic changes: Secondary | ICD-10-CM | POA: Diagnosis not present

## 2021-08-26 HISTORY — DX: Light chain (AL) amyloidosis: E85.81

## 2021-08-26 HISTORY — DX: Glomerular disorders in diseases classified elsewhere: N08

## 2021-08-26 HISTORY — DX: Other specified counseling: Z71.89

## 2021-08-26 HISTORY — DX: Multiple myeloma not having achieved remission: C90.00

## 2021-08-26 LAB — IGG, IGA, IGM
IgA: 125 mg/dL (ref 61–437)
IgG (Immunoglobin G), Serum: 572 mg/dL — ABNORMAL LOW (ref 603–1613)
IgM (Immunoglobulin M), Srm: 38 mg/dL (ref 15–143)

## 2021-08-26 MED ORDER — LIDOCAINE HCL (PF) 1 % IJ SOLN: 5.0000 mL | INTRAMUSCULAR | Status: DC | PRN

## 2021-08-26 MED ORDER — SODIUM CHLORIDE 0.9 % IV SOLN: 100.0000 mL | INTRAVENOUS | Status: DC | PRN

## 2021-08-26 MED ORDER — HEPARIN SODIUM (PORCINE) 1000 UNIT/ML DIALYSIS
20.0000 [IU]/kg | INTRAMUSCULAR | Status: DC | PRN
  Administered 2021-08-26: 2200 [IU] via INTRAVENOUS_CENTRAL
  Filled 2021-08-26: qty 3

## 2021-08-26 MED ORDER — ALTEPLASE 2 MG IJ SOLR: 2.0000 mg | Freq: Once | INTRAMUSCULAR | Status: DC | PRN

## 2021-08-26 MED ORDER — HEPARIN SODIUM (PORCINE) 1000 UNIT/ML DIALYSIS
1000.0000 [IU] | INTRAMUSCULAR | Status: DC | PRN
  Administered 2021-08-26: 1000 [IU] via INTRAVENOUS_CENTRAL
  Filled 2021-08-26: qty 1

## 2021-08-26 MED ORDER — GERHARDT'S BUTT CREAM
TOPICAL_CREAM | CUTANEOUS | Status: DC | PRN
  Filled 2021-08-26: qty 1

## 2021-08-26 MED ORDER — PENTAFLUOROPROP-TETRAFLUOROETH EX AERO: 1.0000 "application " | INHALATION_SPRAY | CUTANEOUS | Status: DC | PRN

## 2021-08-26 MED ORDER — LIDOCAINE-PRILOCAINE 2.5-2.5 % EX CREA: 1.0000 "application " | TOPICAL_CREAM | CUTANEOUS | Status: DC | PRN

## 2021-08-26 NOTE — Progress Notes (Signed)
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 He is found to have bilateral pleural effusions R>L, with recent 2 L thoracentesis performed on 08/07/2021. Cr  on admission up at 3.3 from baseline of 1.2-1.3 a year ago. He has 3+ proteinuria. He was initially admitted by cardiology, transferred to [facility].  Nephrology consulted for evaluation of nephrotic syndrome.  He underwent successful placement of tunneled HD catheter on 08/21/2021. He underwent bone marrow biopsy and renal biopsy on 10/11 and started on hemodialysis.   Bone marrow biopsy consistent with myeloma.  Kidney biopsy with amyloidosis.  Oncology suggested picture consistent with lambda light chain myeloma.   Assessment & Plan:   Principal Problem:   Nephrotic syndrome Active Problems:   Malignant neoplasm of prostate (HCC)   Coronary artery disease   Essential hypertension   Dyslipidemia  Anasarca with bilateral pleural effusions/proteinuria/acute kidney injury ascites and hypoalbuminemia: Nephrotic syndrome secondary to amyloidosis. Echocardiogram was essentially normal.  Ultrasound of the kidneys normal-appearing kidneys with exception of left lower pole renal cyst. Due to significant uremia and fluid overload, started on hemodialysis 10/11, 10/12.  Patient is now on a scheduled hemodialysis TTS schedule.  Lambda light chain myeloma: Seen by oncology.  Recommended inpatient chemotherapy possibly Monday.  Followed by Dr. Myna Hidalgo.  Essential hypertension: Blood pressure optimal controlled on hydralazine, Imdur and Lasix.  Coronary artery disease: Currently asymptomatic.  BPH: On Flomax.  Abnormal urinalysis: Final culture with multiple species.   Completed antibiotic therapy.  DVT prophylaxis: heparin injection 5,000 Units Start: 08/23/21 1000  Heparin subcu   Code Status: Partial Family Communication: None.  Patient is communicating with family. Disposition Plan: Status is: Inpatient  Remains inpatient appropriate because:IV treatments appropriate due to intensity of illness or inability to take PO and Inpatient level of care appropriate due to severity of illness  Dispo: The patient is from: Home              Anticipated d/c is to: Home              Patient currently is not medically stable to d/c.   Difficult to place patient No         Consultants:  Cardiology Nephrology  Procedures:  Renal biopsy, bone marrow biopsy 10/11 Right permacath 10/10.  Antimicrobials:  Rocephin.  Completed antibiotics.   Subjective: Patient seen and examined.  No new events.  Objective: Vitals:   08/26/21 0500 08/26/21 0849 08/26/21 1024 08/26/21 1040  BP:  (!) 107/48 (!) 133/58 (!) 156/59  Pulse:  74    Resp:  20 (!) 22 16  Temp:  98.8 F (37.1 C) 98.9 F (37.2 C) 98.9 F (37.2 C)  TempSrc:  Oral Oral   SpO2:  93% 94%   Weight: 113.8 kg  112.1 kg   Height:        Intake/Output Summary (Last 24 hours) at 08/26/2021 1123 Last data filed at 08/26/2021 0500 Gross per 24 hour  Intake 643 ml  Output 200 ml  Net 443 ml   Filed Weights   08/25/21 0541 08/26/21 0500 08/26/21 1024  Weight: 111.6 kg 113.8 kg 112.1 kg    Examination:  Looks comfortable.  He has generalized anasarca.  Right arm is very much edematous.  Data Reviewed: I have personally reviewed following labs and imaging studies  CBC: Recent Labs  Lab 08/20/21 0200 08/21/21 0355 08/25/21 0318  WBC 5.6 8.4 8.6  HGB 10.0* 9.3* 10.0*  HCT 30.4* 28.7* 30.1*  MCV 86.9 88.0 87.2  PLT 334 312 828   Basic Metabolic Panel: Recent Labs  Lab 08/20/21 0729 08/20/21 1747 08/21/21 0355 08/22/21 0248 08/23/21 0257 08/25/21 0318  NA 136  --  137  136 135 136  K 5.2* 5.0 4.9 4.6 4.3 4.6  CL 104  --  104 103 103 103  CO2 21*  --  24 24 21* 22  GLUCOSE 106*  --  85 86 104* 80  BUN 85*  --  93* 100* 84* 54*  CREATININE 4.28*  --  4.80* 4.82* 4.03* 3.28*  CALCIUM 8.4*  --  8.0* 7.8* 7.6* 8.0*  MG  --   --   --   --   --  2.2  PHOS 7.0*  --  7.2*  --   --   --    GFR: Estimated Creatinine Clearance: 26.3 mL/min (A) (by C-G formula based on SCr of 3.28 mg/dL (H)). Liver Function Tests: Recent Labs  Lab 08/20/21 0729 08/21/21 0355  ALBUMIN 2.5* 2.3*   No results for input(s): LIPASE, AMYLASE in the last 168 hours. No results for input(s): AMMONIA in the last 168 hours. Coagulation Profile: Recent Labs  Lab 08/21/21 0355  INR 1.0   Cardiac Enzymes: No results for input(s): CKTOTAL, CKMB, CKMBINDEX, TROPONINI in the last 168 hours. BNP (last 3 results) No results for input(s): PROBNP in the last 8760 hours. HbA1C: No results for input(s): HGBA1C in the last 72 hours. CBG: No results for input(s): GLUCAP in the last 168 hours. Lipid Profile: No results for input(s): CHOL, HDL, LDLCALC, TRIG, CHOLHDL, LDLDIRECT in the last 72 hours. Thyroid Function Tests: No results for input(s): TSH, T4TOTAL, FREET4, T3FREE, THYROIDAB in the last 72 hours. Anemia Panel: No results for input(s): VITAMINB12, FOLATE, FERRITIN, TIBC, IRON, RETICCTPCT in the last 72 hours. Sepsis Labs: No results for input(s): PROCALCITON, LATICACIDVEN in the last 168 hours.  Recent Results (from the past 240 hour(s))  Urine Culture     Status: Abnormal   Collection Time: 08/17/21  5:24 AM   Specimen: Urine, Clean Catch  Result Value Ref Range Status   Specimen Description URINE, CLEAN CATCH  Final   Special Requests   Final    NONE Performed at [facility], [address] [place], [place] [address]    Culture MULTIPLE SPECIES PRESENT, SUGGEST RECOLLECTION (A)  Final   Report Status 08/18/2021 FINAL  Final  Gram stain     Status: None    Collection Time: 08/18/21  9:27 AM   Specimen: Abdomen; Peritoneal Fluid  Result Value Ref Range Status   Specimen Description PERITONEAL FLUID  Final   Special Requests NONE  Final   Gram Stain   Final    WBC PRESENT, PREDOMINANTLY MONONUCLEAR NO ORGANISMS SEEN CYTOSPIN SMEAR Performed at [facility], [place] [address], [place], [place] [address]    Report Status 08/18/2021 FINAL  Final  Culture, body fluid w Gram Stain-bottle     Status: None   Collection Time: 08/18/21  9:27 AM   Specimen: Peritoneal Washings  Result Value Ref Range Status   Specimen Description PERITONEAL FLUID  Final   Special Requests NONE  Final   Culture   Final    NO  GROWTH 5 DAYS Performed at [facility], [place] [address] [place], [place] [address]    Report Status 08/23/2021 FINAL  Final         Radiology Studies: No results found.      Scheduled Meds:  atorvastatin  40 mg Oral Daily   Chlorhexidine Gluconate Cloth  6 each Topical Daily   Chlorhexidine Gluconate Cloth  6 each Topical Q0600   ezetimibe  10 mg Oral QPC supper   heparin  5,000 Units Subcutaneous Q8H   hydrALAZINE  25 mg Oral Q8H   isosorbide mononitrate  60 mg Oral Daily   sodium chloride flush  3 mL Intravenous Q12H   tamsulosin  0.4 mg Oral Daily   Continuous Infusions:  sodium chloride 10 mL/hr at 08/24/21 0012   sodium chloride 10 mL/hr at 08/18/21 2046   sodium chloride     sodium chloride       LOS: 12 days    Time spent: 25 minutes    Barb Merino, MD [facility] Pager [phone]

## 2021-08-26 NOTE — Progress Notes (Signed)
[facility] NEPHROLOGY PROGRESS NOTE  Assessment/ Plan:  #Acute kidney Injury: Earlier this year with creatinine of about 1.3 with significant rise over the last few weeks.  Renal biopsy showed lambda AL amyloidosis with significant involvement of glomeruli,tubulointerstitial and vascular regions with moderate to severe tubulo-interstitial fibrosis.  The biopsy finding carries a poor prognosis and I think he will be dialysis dependent.  The outpatient HD arranged at [facility] on TTS schedule, need to arrive at 10:45 AM in first treatment.  He has tunneled HD catheter for the access. Plan for regular dialysis today.  Off of diuretics.  # AL Amyloidosis: Dr. Marin Olp is following and noted plan for combination of Velcade, Cytoxan and daratumumab, presumably starting early next week.  # Anasarca: Secondary to worsening renal function/nephrotic syndrome from amyloidosis. Hepatic and cardiac involvement in amyloidosis likely are contributory. Continue HD/UF.  Discontinued IV Lasix and metolazone.  #  Urinary tract infection: Treated with ceftriaxone.  #  Anemia of chronic illness: Without overt blood loss, monitor hemoglobin.  No ESA because of myeloma.  Subjective: Seen and examined at bedside.  No urine output for discontinuing diuretics.  Denies nausea vomiting chest pain shortness of breath.  No new event.   Objective Vital signs in last 24 hours: Vitals:   08/25/21 1132 08/25/21 1929 08/26/21 0400 08/26/21 0500  BP: (!) 119/58 (!) 104/48 (!) 130/57   Pulse: 75 72    Resp: 20 19    Temp: 97.6 F (36.4 C) 98.6 F (37 C) 98.8 F (37.1 C)   TempSrc: Oral Oral Oral   SpO2: 96% 96% 92%   Weight:    113.8 kg  Height:       Weight change: -1.6 kg  Intake/Output Summary (Last 24 hours) at 08/26/2021 0844 Last data filed at 08/26/2021 0500 Gross per 24 hour  Intake 883 ml  Output 200 ml  Net 683 ml        Labs: Basic Metabolic Panel: Recent Labs  Lab  08/20/21 0729 08/20/21 1747 08/21/21 0355 08/22/21 0248 08/23/21 0257 08/25/21 0318  NA 136  --  137 136 135 136  K 5.2*   < > 4.9 4.6 4.3 4.6  CL 104  --  104 103 103 103  CO2 21*  --  24 24 21* 22  GLUCOSE 106*  --  85 86 104* 80  BUN 85*  --  93* 100* 84* 54*  CREATININE 4.28*  --  4.80* 4.82* 4.03* 3.28*  CALCIUM 8.4*  --  8.0* 7.8* 7.6* 8.0*  PHOS 7.0*  --  7.2*  --   --   --    < > = values in this interval not displayed.    Liver Function Tests: Recent Labs  Lab 08/20/21 0729 08/21/21 0355  ALBUMIN 2.5* 2.3*    No results for input(s): LIPASE, AMYLASE in the last 168 hours. No results for input(s): AMMONIA in the last 168 hours. CBC: Recent Labs  Lab 08/20/21 0200 08/21/21 0355 08/25/21 0318  WBC 5.6 8.4 8.6  HGB 10.0* 9.3* 10.0*  HCT 30.4* 28.7* 30.1*  MCV 86.9 88.0 87.2  PLT 334 312 228    Cardiac Enzymes: No results for input(s): CKTOTAL, CKMB, CKMBINDEX, TROPONINI in the last 168 hours. CBG: No results for input(s): GLUCAP in the last 168 hours.  Iron Studies: No results for input(s): IRON, TIBC, TRANSFERRIN, FERRITIN in the last 72 hours. Studies/Results: No results found.  Medications: Infusions:  sodium chloride 10 mL/hr at  08/24/21 0012   sodium chloride 10 mL/hr at 08/18/21 2046    Scheduled Medications:  atorvastatin  40 mg Oral Daily   Chlorhexidine Gluconate Cloth  6 each Topical Daily   Chlorhexidine Gluconate Cloth  6 each Topical Q0600   ezetimibe  10 mg Oral QPC supper   heparin  5,000 Units Subcutaneous Q8H   hydrALAZINE  25 mg Oral Q8H   isosorbide mononitrate  60 mg Oral Daily   sodium chloride flush  3 mL Intravenous Q12H   tamsulosin  0.4 mg Oral Daily    have reviewed scheduled and prn medications.  Physical Exam: General: Pleasant male, not in distress comfortable. Heart:RRR, s1s2 nl Lungs: Clear lungs bilateral, no increased work of breathing. Abdomen:soft, Non-tender Extremities: Anasarca with edematous both  upper and lower extremities, not much change from yesterday Dialysis Access: Right IJ TDC in place.  Corey Beard 08/26/2021,8:44 AM  LOS: 12 days

## 2021-08-26 NOTE — Progress Notes (Signed)
Pt back from dialysis to room. Will continue to closely monitor. Delia Heady RN   08/26/21 1455  Vitals  Temp 99.2 F (37.3 C)  Temp Source Oral  BP (!) 123/51  MAP (mmHg) 68  BP Location Right Wrist  BP Method Automatic  Patient Position (if appropriate) Lying  Pulse Rate 74  Pulse Rate Source Monitor  Resp 20  Level of Consciousness  Level of Consciousness Alert  Oxygen Therapy  SpO2 99 %  O2 Device Room Air

## 2021-08-26 NOTE — Progress Notes (Signed)
Pt transported off unit to dialysis for treatment. P. Amo Merna Baldi RN. 

## 2021-08-27 DIAGNOSIS — N049 Nephrotic syndrome with unspecified morphologic changes: Secondary | ICD-10-CM | POA: Diagnosis not present

## 2021-08-27 NOTE — Progress Notes (Signed)
[facility] PROGRESS NOTE  Assessment/ Plan:  #Acute kidney Injury: Earlier this year with creatinine of about 1.3 with significant rise over the last few weeks.  Renal biopsy showed lambda AL amyloidosis with significant involvement of glomeruli,tubulointerstitial and vascular regions with moderate to severe tubulo-interstitial fibrosis.  The biopsy finding carries a poor prognosis and I think he will be dialysis dependent.  The outpatient HD arranged at [facility] on TTS schedule, need to arrive at 10:45 AM in first treatment.  He has tunneled HD catheter for the access. Status post HD yesterday with around 3.5 L UF, tolerated well.  Plan for next HD on 10/18.  Off of diuretics.  # AL Amyloidosis: Dr. Marin Olp is following and noted plan for combination of Velcade, Cytoxan and daratumumab, presumably starting early next week.  # Anasarca: Secondary to worsening renal function/nephrotic syndrome from amyloidosis. Hepatic and cardiac involvement in amyloidosis likely are contributory. Continue HD/UF.  Discontinued IV Lasix and metolazone.  #  Urinary tract infection: Treated with ceftriaxone.  #  Anemia of chronic illness: Without overt blood loss, monitor hemoglobin.  No ESA because of myeloma.  Subjective: Seen and examined at bedside.  No new event.  Denies nausea, vomiting, chest pain, shortness of breath.  No issue with dialysis yesterday.  Objective Vital signs in last 24 hours: Vitals:   08/26/21 1345 08/26/21 1455 08/26/21 1945 08/27/21 0500  BP: (!) 156/67 (!) 123/51 (!) 127/55 (!) 125/51  Pulse: 70 74 69 67  Resp: 12 20 16 18   Temp: 97.7 F (36.5 C) 99.2 F (37.3 C) 98.1 F (36.7 C) 98.7 F (37.1 C)  TempSrc: Oral Oral Oral Oral  SpO2: 98% 99% 95% 94%  Weight: 109.3 kg   109.5 kg  Height:       Weight change: -1.7 kg  Intake/Output Summary (Last 24 hours) at 08/27/2021 0912 Last data filed at 08/26/2021 1345 Gross per 24 hour  Intake --   Output 3500 ml  Net -3500 ml        Labs: Basic Metabolic Panel: Recent Labs  Lab 08/21/21 0355 08/22/21 0248 08/23/21 0257 08/25/21 0318  NA 137 136 135 136  K 4.9 4.6 4.3 4.6  CL 104 103 103 103  CO2 24 24 21* 22  GLUCOSE 85 86 104* 80  BUN 93* 100* 84* 54*  CREATININE 4.80* 4.82* 4.03* 3.28*  CALCIUM 8.0* 7.8* 7.6* 8.0*  PHOS 7.2*  --   --   --     Liver Function Tests: Recent Labs  Lab 08/21/21 0355  ALBUMIN 2.3*    No results for input(s): LIPASE, AMYLASE in the last 168 hours. No results for input(s): AMMONIA in the last 168 hours. CBC: Recent Labs  Lab 08/21/21 0355 08/25/21 0318  WBC 8.4 8.6  HGB 9.3* 10.0*  HCT 28.7* 30.1*  MCV 88.0 87.2  PLT 312 228    Cardiac Enzymes: No results for input(s): CKTOTAL, CKMB, CKMBINDEX, TROPONINI in the last 168 hours. CBG: No results for input(s): GLUCAP in the last 168 hours.  Iron Studies: No results for input(s): IRON, TIBC, TRANSFERRIN, FERRITIN in the last 72 hours. Studies/Results: No results found.  Medications: Infusions:  sodium chloride 10 mL/hr at 08/24/21 0012   sodium chloride 10 mL/hr at 08/18/21 2046    Scheduled Medications:  atorvastatin  40 mg Oral Daily   Chlorhexidine Gluconate Cloth  6 each Topical Daily   Chlorhexidine Gluconate Cloth  6 each Topical Q0600   ezetimibe  10 mg Oral QPC supper   heparin  5,000 Units Subcutaneous Q8H   hydrALAZINE  25 mg Oral Q8H   isosorbide mononitrate  60 mg Oral Daily   sodium chloride flush  3 mL Intravenous Q12H   tamsulosin  0.4 mg Oral Daily    have reviewed scheduled and prn medications.  Physical Exam: General: Pleasant male, lying on bed comfortable. Heart:RRR, s1s2 nl Lungs: Clear lungs bilateral, no increased work of breathing. Abdomen:soft, Non-tender Extremities: Anasarca with edematous both upper and lower extremities, not much change from yesterday Dialysis Access: Right IJ TDC in place.  Corey Beard  Corey Beard 08/27/2021,9:12 AM  LOS: 13 days

## 2021-08-27 NOTE — Progress Notes (Signed)
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 He is found to have bilateral pleural effusions R>L, with recent 2 L thoracentesis performed on 08/07/2021. Cr  on admission up at 3.3 from baseline of 1.2-1.3 a year ago. He has 3+ proteinuria. He was initially admitted by cardiology, transferred to [facility].  Nephrology consulted for evaluation of nephrotic syndrome.  He underwent successful placement of tunneled HD catheter on 08/21/2021. He underwent bone marrow biopsy and renal biopsy on 10/11 and started on hemodialysis.   Bone marrow biopsy consistent with myeloma.  Kidney biopsy with amyloidosis.  Oncology suggested picture consistent with lambda light chain myeloma.   Assessment & Plan:   Principal Problem:   Nephrotic syndrome Active Problems:   Malignant neoplasm of prostate (HCC)   Coronary artery disease   Essential hypertension   Dyslipidemia   Amyloid light chain nephropathy (HCC)   Lambda light chain myeloma (HCC)   Goals of care, counseling/discussion  Anasarca with bilateral pleural effusions/proteinuria/acute kidney injury ascites and hypoalbuminemia: Nephrotic syndrome secondary to amyloidosis. Echocardiogram was essentially normal.  Ultrasound of the kidneys normal-appearing kidneys with exception of left lower pole renal cyst. Due to significant uremia and fluid overload, started on hemodialysis 10/11, 10/12.  Patient is now on a scheduled hemodialysis TTS schedule.  Lambda light chain myeloma: Seen by oncology.  Recommended inpatient chemotherapy possibly Monday.  Followed by Dr. Marin Olp.  Essential hypertension: Blood pressure optimal controlled on hydralazine, Imdur and Lasix.  Coronary artery  disease: Currently asymptomatic.  BPH: On Flomax.  Abnormal urinalysis: Final culture with multiple species.  Completed antibiotic therapy.  DVT prophylaxis: heparin injection 5,000 Units Start: 08/23/21 1000  Heparin subcu   Code Status: Partial Family Communication: None.  Patient is communicating with family. Disposition Plan: Status is: Inpatient  Remains inpatient appropriate because:IV treatments appropriate due to intensity of illness or inability to take PO and Inpatient level of care appropriate due to severity of illness  Dispo: The patient is from: Home              Anticipated d/c is to: Home              Patient currently is not medically stable to d/c.   Difficult to place patient No         Consultants:  Cardiology Nephrology  Procedures:  Renal biopsy, bone marrow biopsy 10/11 Right permacath 10/10.  Antimicrobials:  Rocephin.  Completed antibiotics.   Subjective: No new events.  Right arm is more edematous today.  Objective: Vitals:   08/26/21 1455 08/26/21 1945 08/27/21 0500 08/27/21 0900  BP: (!) 123/51 (!) 127/55 (!) 125/51 (!) 100/56  Pulse: 74 69 67 79  Resp: _0 Temp: 99.2 F (37.3 C) 98.1 F (36.7 C) 98.7 F (37.1 C)   TempSrc: Oral Oral Oral   SpO2: 99% 95% 94%   Weight:   109.5 kg   Height:        Intake/Output Summary (Last 24 hours) at 08/27/2021 1013 Last data filed at 08/27/2021 0900 Gross per 24 hour  Intake 120 ml  Output 3800 ml  Net -3680 ml   Filed Weights   08/26/21 1024 08/26/21 1345 08/27/21 0500  Weight: 112.1 kg 109.3 kg 109.5 kg  Examination:  Patient looks comfortable.  Generalized anasarca.  Right arm has more edema and swelling.  No redness or erythema. Permacath present on right chest.  Data Reviewed: I have personally reviewed following labs and imaging studies  CBC: Recent Labs  Lab 08/21/21 0355 08/25/21 0318  WBC 8.4 8.6  HGB 9.3* 10.0*  HCT 28.7* 30.1*  MCV 88.0 87.2  PLT  312 845   Basic Metabolic Panel: Recent Labs  Lab 08/20/21 1747 08/21/21 0355 08/22/21 0248 08/23/21 0257 08/25/21 0318  NA  --  137 136 135 136  K 5.0 4.9 4.6 4.3 4.6  CL  --  104 103 103 103  CO2  --  24 24 21* 22  GLUCOSE  --  85 86 104* 80  BUN  --  93* 100* 84* 54*  CREATININE  --  4.80* 4.82* 4.03* 3.28*  CALCIUM  --  8.0* 7.8* 7.6* 8.0*  MG  --   --   --   --  2.2  PHOS  --  7.2*  --   --   --    GFR: Estimated Creatinine Clearance: 26 mL/min (A) (by C-G formula based on SCr of 3.28 mg/dL (H)). Liver Function Tests: Recent Labs  Lab 08/21/21 0355  ALBUMIN 2.3*   No results for input(s): LIPASE, AMYLASE in the last 168 hours. No results for input(s): AMMONIA in the last 168 hours. Coagulation Profile: Recent Labs  Lab 08/21/21 0355  INR 1.0   Cardiac Enzymes: No results for input(s): CKTOTAL, CKMB, CKMBINDEX, TROPONINI in the last 168 hours. BNP (last 3 results) No results for input(s): PROBNP in the last 8760 hours. HbA1C: No results for input(s): HGBA1C in the last 72 hours. CBG: No results for input(s): GLUCAP in the last 168 hours. Lipid Profile: No results for input(s): CHOL, HDL, LDLCALC, TRIG, CHOLHDL, LDLDIRECT in the last 72 hours. Thyroid Function Tests: No results for input(s): TSH, T4TOTAL, FREET4, T3FREE, THYROIDAB in the last 72 hours. Anemia Panel: No results for input(s): VITAMINB12, FOLATE, FERRITIN, TIBC, IRON, RETICCTPCT in the last 72 hours. Sepsis Labs: No results for input(s): PROCALCITON, LATICACIDVEN in the last 168 hours.  Recent Results (from the past 240 hour(s))  Gram stain     Status: None   Collection Time: 08/18/21  9:27 AM   Specimen: Abdomen; Peritoneal Fluid  Result Value Ref Range Status   Specimen Description PERITONEAL FLUID  Final   Special Requests NONE  Final   Gram Stain   Final    WBC PRESENT, PREDOMINANTLY MONONUCLEAR NO ORGANISMS SEEN CYTOSPIN SMEAR Performed at [facility], [address],  [place], [place] [address]    Report Status 08/18/2021 FINAL  Final  Culture, body fluid w Gram Stain-bottle     Status: None   Collection Time: 08/18/21  9:27 AM   Specimen: Peritoneal Washings  Result Value Ref Range Status   Specimen Description PERITONEAL FLUID  Final   Special Requests NONE  Final   Culture   Final    NO GROWTH 5 DAYS Performed at [facility] [address] [place], [place] [address]    Report Status 08/23/2021 FINAL  Final         Radiology Studies: No results found.      Scheduled Meds:  atorvastatin  40 mg Oral Daily   Chlorhexidine Gluconate Cloth  6 each Topical Daily   Chlorhexidine Gluconate Cloth  6 each Topical Q0600   ezetimibe  10 mg Oral  QPC supper   heparin  5,000 Units Subcutaneous Q8H   hydrALAZINE  25 mg Oral Q8H   isosorbide mononitrate  60 mg Oral Daily   sodium chloride flush  3 mL Intravenous Q12H   tamsulosin  0.4 mg Oral Daily   Continuous Infusions:  sodium chloride 10 mL/hr at 08/24/21 0012   sodium chloride 10 mL/hr at 08/18/21 2046     LOS: 13 days    Time spent: 25 minutes    Barb Merino, MD [facility] Pager [phone]

## 2021-08-27 NOTE — Plan of Care (Signed)
Patient progressing 

## 2021-08-28 DIAGNOSIS — E782 Mixed hyperlipidemia: Secondary | ICD-10-CM | POA: Insufficient documentation

## 2021-08-28 DIAGNOSIS — N401 Enlarged prostate with lower urinary tract symptoms: Secondary | ICD-10-CM | POA: Insufficient documentation

## 2021-08-28 DIAGNOSIS — N529 Male erectile dysfunction, unspecified: Secondary | ICD-10-CM | POA: Insufficient documentation

## 2021-08-28 DIAGNOSIS — Z8546 Personal history of malignant neoplasm of prostate: Secondary | ICD-10-CM | POA: Insufficient documentation

## 2021-08-28 DIAGNOSIS — N049 Nephrotic syndrome with unspecified morphologic changes: Secondary | ICD-10-CM | POA: Diagnosis not present

## 2021-08-28 LAB — COMPREHENSIVE METABOLIC PANEL
ALT: 22 U/L (ref 0–44)
AST: 24 U/L (ref 15–41)
Albumin: 1.9 g/dL — ABNORMAL LOW (ref 3.5–5.0)
Alkaline Phosphatase: 59 U/L (ref 38–126)
Anion gap: 8 (ref 5–15)
BUN: 47 mg/dL — ABNORMAL HIGH (ref 8–23)
CO2: 26 mmol/L (ref 22–32)
Calcium: 8.1 mg/dL — ABNORMAL LOW (ref 8.9–10.3)
Chloride: 99 mmol/L (ref 98–111)
Creatinine, Ser: 3.78 mg/dL — ABNORMAL HIGH (ref 0.61–1.24)
GFR, Estimated: 16 mL/min — ABNORMAL LOW (ref >60)
Glucose, Bld: 78 mg/dL (ref 70–99)
Potassium: 3.5 mmol/L (ref 3.5–5.1)
Sodium: 133 mmol/L — ABNORMAL LOW (ref 135–145)
Total Bilirubin: 0.5 mg/dL (ref 0.3–1.2)
Total Protein: 4.3 g/dL — ABNORMAL LOW (ref 6.5–8.1)

## 2021-08-28 LAB — CBC WITH DIFFERENTIAL/PLATELET
Abs Immature Granulocytes: 0.02 10*3/uL (ref 0.00–0.07)
Basophils Absolute: 0 10*3/uL (ref 0.0–0.1)
Basophils Relative: 1 %
Eosinophils Absolute: 0.3 10*3/uL (ref 0.0–0.5)
Eosinophils Relative: 5 %
HCT: 25.2 % — ABNORMAL LOW (ref 39.0–52.0)
Hemoglobin: 8.2 g/dL — ABNORMAL LOW (ref 13.0–17.0)
Immature Granulocytes: 0 %
Lymphocytes Relative: 32 %
Lymphs Abs: 2 10*3/uL (ref 0.7–4.0)
MCH: 28.9 pg (ref 26.0–34.0)
MCHC: 32.5 g/dL (ref 30.0–36.0)
MCV: 88.7 fL (ref 80.0–100.0)
Monocytes Absolute: 1.1 10*3/uL — ABNORMAL HIGH (ref 0.1–1.0)
Monocytes Relative: 19 %
Neutro Abs: 2.6 10*3/uL (ref 1.7–7.7)
Neutrophils Relative %: 43 %
Platelets: 263 10*3/uL (ref 150–400)
RBC: 2.84 MIL/uL — ABNORMAL LOW (ref 4.22–5.81)
RDW: 15 % (ref 11.5–15.5)
WBC: 6.1 10*3/uL (ref 4.0–10.5)
nRBC: 0 % (ref 0.0–0.2)

## 2021-08-28 MED ORDER — ACYCLOVIR 400 MG PO TABS
200.0000 mg | ORAL_TABLET | Freq: Two times a day (BID) | ORAL | Status: DC
  Administered 2021-08-28 – 2021-09-01 (×9): 200 mg via ORAL
  Filled 2021-08-28 (×10): qty 0.5

## 2021-08-28 MED ORDER — DEXAMETHASONE SODIUM PHOSPHATE 100 MG/10ML IJ SOLN
40.0000 mg | Freq: Once | INTRAMUSCULAR | Status: AC
  Administered 2021-08-28: 40 mg via INTRAVENOUS
  Filled 2021-08-28: qty 4

## 2021-08-28 MED ORDER — SODIUM CHLORIDE 0.9 % IV SOLN
225.0000 mg/m2 | Freq: Once | INTRAVENOUS | Status: AC
  Administered 2021-08-28: 540 mg via INTRAVENOUS
  Filled 2021-08-28: qty 27

## 2021-08-28 MED ORDER — BORTEZOMIB CHEMO SQ INJECTION 3.5 MG (2.5MG/ML)
1.5000 mg/m2 | Freq: Once | INTRAMUSCULAR | Status: AC
  Administered 2021-08-28: 3.5 mg via SUBCUTANEOUS
  Filled 2021-08-28: qty 1.4

## 2021-08-28 MED ORDER — PALONOSETRON HCL INJECTION 0.25 MG/5ML
0.2500 mg | Freq: Once | INTRAVENOUS | Status: AC
  Administered 2021-08-28: 0.25 mg via INTRAVENOUS
  Filled 2021-08-28: qty 5

## 2021-08-28 MED ORDER — SODIUM CHLORIDE 0.9 % IV SOLN: Freq: Once | INTRAVENOUS | Status: AC

## 2021-08-28 NOTE — Progress Notes (Signed)
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  He has tunneled HD catheter for the access. HD tomorrow if here: 3-4L UF, IVB albumin prn, 3K, 68F, TDC, Tight heparin BPs too soft for nitrate and hydral; stop for now Might req midodrine  # AL Amyloidosis: Dr. Marin Olp is following and will be starting  combination of Velcade, Cytoxan and daratumumab  # Anasarca: Secondary to worsening renal function/nephrotic syndrome from amyloidosis. Hepatic and cardiac involvement in amyloidosis likely are contributory. Continue HD/UF.  UF as able  #  Urinary tract infection: Per TRH  #  Anemia of chronic illness: Without overt blood loss, monitor hemoglobin.  No ESA because of myeloma.  Subjective:  No interval events Remains hypervolemic Starting Chemo today  Objective Vital signs in last 24 hours: Vitals:   08/27/21 1400 08/27/21 2034 08/28/21 0332 08/28/21 0900  BP: (!) 104/42 (!) 141/64 (!) 101/55 (!) 87/60  Pulse: 69 71 74 82  Resp:  18 18   Temp:  97.9 F (36.6 C) 98.5 F (36.9 C)   TempSrc:  Oral Oral   SpO2:  93% 93%   Weight:   109.4 kg   Height:       Weight change: -2.7 kg  Intake/Output Summary (Last 24 hours) at 08/28/2021 1051 Last data filed at 08/28/2021 0900 Gross per 24 hour  Intake 460 ml  Output 300 ml  Net 160 ml        Labs: Basic Metabolic Panel: Recent Labs  Lab 08/23/21 0257 08/25/21 0318 08/28/21 0321  NA 135 136 133*  K 4.3 4.6 3.5  CL 103 103 99  CO2 21* 22 26  GLUCOSE 104* 80 78  BUN 84* 54* 47*  CREATININE 4.03* 3.28* 3.78*  CALCIUM 7.6* 8.0* 8.1*    Liver Function Tests: Recent Labs  Lab 08/28/21 0321  AST 24  ALT 22  ALKPHOS 59  BILITOT 0.5  PROT 4.3*  ALBUMIN 1.9*    No results for input(s): LIPASE, AMYLASE in the last 168 hours. No  results for input(s): AMMONIA in the last 168 hours. CBC: Recent Labs  Lab 08/25/21 0318 08/28/21 0321  WBC 8.6 6.1  NEUTROABS  --  2.6  HGB 10.0* 8.2*  HCT 30.1* 25.2*  MCV 87.2 88.7  PLT 228 263    Cardiac Enzymes: No results for input(s): CKTOTAL, CKMB, CKMBINDEX, TROPONINI in the last 168 hours. CBG: No results for input(s): GLUCAP in the last 168 hours.  Iron Studies: No results for input(s): IRON, TIBC, TRANSFERRIN, FERRITIN in the last 72 hours. Studies/Results: No results found.  Medications: Infusions:  sodium chloride 10 mL/hr at 08/24/21 0012   sodium chloride 10 mL/hr at 08/18/21 2046   dexamethasone (DECADRON) IVPB ([facility])      Scheduled Medications:  acyclovir  200 mg Oral BID   atorvastatin  40 mg Oral Daily   bortezomib SQ  1.5 mg/m2 (Treatment Plan Recorded) Subcutaneous Once   Chlorhexidine Gluconate Cloth  6 each Topical Daily   Chlorhexidine Gluconate Cloth  6 each Topical Q0600   cyclophosphamide  225 mg/m2 (Treatment Plan Recorded) Intravenous Once   ezetimibe  10 mg Oral QPC supper   heparin  5,000 Units Subcutaneous Q8H   hydrALAZINE  25 mg Oral Q8H   isosorbide mononitrate  60 mg Oral Daily   palonosetron  0.25 mg Intravenous Once   sodium chloride flush  3 mL Intravenous Q12H   tamsulosin  0.4 mg Oral Daily    have reviewed scheduled and prn medications.  Physical Exam: General: Pleasant male, lying on bed comfortable. Heart:RRR, s1s2 nl Lungs: Clear lungs bilateral, no increased work of breathing. Abdomen:soft, Non-tender Extremities: Anasarca with edematous both upper and lower extremities, not much change from yesterday Dialysis Access: Right IJ TDC in place.  Petr Bontempo B Emmalena Canny 08/28/2021,10:51 AM  LOS: 14 days

## 2021-08-28 NOTE — Plan of Care (Signed)
Patient progressing 

## 2021-08-28 NOTE — Progress Notes (Addendum)
Ok to proceed with Velcade and Cytoxan treatment today with Scr 3.78. Begin Velcade 1.5mg /m2 today - Dose clarified with MD. Cytoxan dose has been reduced 25% due to patient being on HD. Will begin acyclovir 200mg  po q 12 hours today.  Raul Del [place], [place], BCPS, BCOP 08/28/2021 9:25 AM

## 2021-08-28 NOTE — Care Management Important Message (Signed)
Important Message  Patient Details  Name: Corey Beard MRN: [id number] Date of Birth: Feb 21, 1946   Medicare Important Message Given:  Yes     [name] 08/28/2021, 10:16 AM

## 2021-08-28 NOTE — Progress Notes (Signed)
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 He is found to have bilateral pleural effusions R>L, with recent 2 L thoracentesis performed on 08/07/2021. Cr  on admission up at 3.3 from baseline of 1.2-1.3 a year ago. He has 3+ proteinuria. He was initially admitted by cardiology, transferred to [facility].  Nephrology consulted for evaluation of nephrotic syndrome.  He underwent successful placement of tunneled HD catheter on 08/21/2021. He underwent bone marrow biopsy and renal biopsy on 10/11 and started on hemodialysis.   Bone marrow biopsy consistent with myeloma.  Kidney biopsy with amyloidosis.  Oncology suggested picture consistent with lambda light chain myeloma.   Assessment & Plan:   Principal Problem:   Nephrotic syndrome Active Problems:   Malignant neoplasm of prostate (HCC)   Coronary artery disease   Essential hypertension   Dyslipidemia   Amyloid light chain nephropathy (HCC)   Lambda light chain myeloma (HCC)   Goals of care, counseling/discussion  Anasarca with bilateral pleural effusions/proteinuria/acute kidney injury ascites and hypoalbuminemia: Nephrotic syndrome secondary to amyloidosis. Echocardiogram was essentially normal.  Ultrasound of the kidneys normal-appearing kidneys with exception of left lower pole renal cyst. Due to significant uremia and fluid overload, started on hemodialysis 10/11, 10/12.  Patient is now on a scheduled hemodialysis TTS schedule.  Lambda light chain myeloma: Seen by oncology.  Recommended inpatient chemotherapy starting today.  Followed by Dr. Marin Olp.  Essential hypertension: Blood pressure optimal controlled on hydralazine, Imdur and Lasix.  Coronary artery  disease: Currently asymptomatic.  BPH: On Flomax.  Abnormal urinalysis: Final culture with multiple species.  Completed antibiotic therapy.  DVT prophylaxis: heparin injection 5,000 Units Start: 08/23/21 1000  Heparin subcu   Code Status: Partial Family Communication: None.  Patient is communicating with family. Disposition Plan: Status is: Inpatient  Remains inpatient appropriate because:IV treatments appropriate due to intensity of illness or inability to take PO and Inpatient level of care appropriate due to severity of illness  Dispo: The patient is from: Home              Anticipated d/c is to: Home              Patient currently is not medically stable to d/c.   Difficult to place patient No         Consultants:  Cardiology Nephrology  Procedures:  Renal biopsy, bone marrow biopsy 10/11 Right permacath 10/10.  Antimicrobials:  Rocephin.  Completed antibiotics.   Subjective: Seen and examined.  No new events.  Arms and legs edematous.  Objective: Vitals:   08/27/21 1125 08/27/21 1400 08/27/21 2034 08/28/21 0332  BP: (!) 93/43 (!) 104/42 (!) 141/64 (!) 101/55  Pulse: 69 69 71 74  Resp:   18 18  Temp: 98.3 F (36.8 C)  97.9 F (36.6 C) 98.5 F (36.9 C)  TempSrc: Oral  Oral Oral  SpO2:   93% 93%  Weight:    109.4 kg  Height:        Intake/Output Summary (Last 24 hours) at 08/28/2021 0930 Last data filed at 08/28/2021 0300 Gross per 24 hour  Intake 220 ml  Output 300 ml  Net -80 ml   Filed Weights   08/26/21 1345 08/27/21 0500 08/28/21 0332  Weight: 109.3 kg 109.5 kg  109.4 kg    Examination:  Patient looks comfortable.  Generalized anasarca.   Right arm which is huge and edematous.  No redness or erythema. Permacath present on right chest.  Data Reviewed: I have personally reviewed following labs and imaging studies  CBC: Recent Labs  Lab 08/25/21 0318 08/28/21 0321  WBC 8.6 6.1  NEUTROABS  --  2.6  HGB 10.0* 8.2*  HCT 30.1* 25.2*   MCV 87.2 88.7  PLT 228 333   Basic Metabolic Panel: Recent Labs  Lab 08/22/21 0248 08/23/21 0257 08/25/21 0318 08/28/21 0321  NA 136 135 136 133*  K 4.6 4.3 4.6 3.5  CL 103 103 103 99  CO2 24 21* 22 26  GLUCOSE 86 104* 80 78  BUN 100* 84* 54* 47*  CREATININE 4.82* 4.03* 3.28* 3.78*  CALCIUM 7.8* 7.6* 8.0* 8.1*  MG  --   --  2.2  --    GFR: Estimated Creatinine Clearance: 22.6 mL/min (A) (by C-G formula based on SCr of 3.78 mg/dL (H)). Liver Function Tests: Recent Labs  Lab 08/28/21 0321  AST 24  ALT 22  ALKPHOS 59  BILITOT 0.5  PROT 4.3*  ALBUMIN 1.9*   No results for input(s): LIPASE, AMYLASE in the last 168 hours. No results for input(s): AMMONIA in the last 168 hours. Coagulation Profile: No results for input(s): INR, PROTIME in the last 168 hours.  Cardiac Enzymes: No results for input(s): CKTOTAL, CKMB, CKMBINDEX, TROPONINI in the last 168 hours. BNP (last 3 results) No results for input(s): PROBNP in the last 8760 hours. HbA1C: No results for input(s): HGBA1C in the last 72 hours. CBG: No results for input(s): GLUCAP in the last 168 hours. Lipid Profile: No results for input(s): CHOL, HDL, LDLCALC, TRIG, CHOLHDL, LDLDIRECT in the last 72 hours. Thyroid Function Tests: No results for input(s): TSH, T4TOTAL, FREET4, T3FREE, THYROIDAB in the last 72 hours. Anemia Panel: No results for input(s): VITAMINB12, FOLATE, FERRITIN, TIBC, IRON, RETICCTPCT in the last 72 hours. Sepsis Labs: No results for input(s): PROCALCITON, LATICACIDVEN in the last 168 hours.  No results found for this or any previous visit (from the past 240 hour(s)).        Radiology Studies: No results found.      Scheduled Meds:  atorvastatin  40 mg Oral Daily   Chlorhexidine Gluconate Cloth  6 each Topical Daily   Chlorhexidine Gluconate Cloth  6 each Topical Q0600   ezetimibe  10 mg Oral QPC supper   heparin  5,000 Units Subcutaneous Q8H   hydrALAZINE  25 mg Oral Q8H    isosorbide mononitrate  60 mg Oral Daily   sodium chloride flush  3 mL Intravenous Q12H   tamsulosin  0.4 mg Oral Daily   Continuous Infusions:  sodium chloride 10 mL/hr at 08/24/21 0012   sodium chloride 10 mL/hr at 08/18/21 2046     LOS: 14 days    Time spent: 25 minutes    Barb Merino, MD [facility] Pager [phone]

## 2021-08-28 NOTE — Progress Notes (Signed)
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 PMH: HTN, DM2, prostate ca, tobacco use, carotid stenosis, non obstructive CAD, glaucoma, arthritis and MI.   OT comments  Patient received in bed and eager to participate. Patient was able to get to eob and was HHA to transfer to [facility].  Patient performed toilet hygiene seated.  Patient ambulated to sink for grooming with HHA.  Patient returned to bed following session awaiting HD.  Acute OT to continue to follow.    Recommendations for follow up therapy are one component of a multi-disciplinary discharge planning process, led by the attending physician.  Recommendations may be updated based on patient status, additional functional criteria and insurance authorization.    Follow Up Recommendations  Home health OT    Equipment Recommendations  None recommended by OT    Recommendations for Other Services      Precautions / Restrictions Precautions Precautions: Fall Restrictions Weight Bearing Restrictions: No       Mobility Bed Mobility Overal bed mobility: Needs Assistance Bed Mobility: Supine to Sit;Sit to Supine     Supine to sit: Supervision Sit to supine: Min assist   General bed mobility comments: required assistance with LLE for sit to supine    Transfers Overall transfer level: Needs assistance Equipment used: 1 person hand held assist Transfers: Sit to/from [facility] Sit to Stand: Min guard Stand pivot transfers: Min guard       General transfer comment: performed toilet transfer to [facility] and to eob    Balance Overall balance assessment: Needs assistance Sitting-balance support: Feet supported;No upper extremity supported Sitting balance-Leahy Scale:  Good     Standing balance support: No upper extremity supported;During functional activity Standing balance-Leahy Scale: Fair Standing balance comment: stood at sink for grooming                           ADL either performed or assessed with clinical judgement   ADL Overall ADL's : Needs assistance/impaired     Grooming: Wash/dry hands;Wash/dry face;Oral care;Standing Grooming Details (indicate cue type and reason): stood at sink for grooming with no UE support                 Toilet Transfer: Psychologist, sport and exercise Details (indicate cue type and reason): hand held assist for toilet transfer from eob to [place] and Hygiene: Supervision/safety;Sitting/lateral lean Toileting - Clothing Manipulation Details (indicate cue type and reason): patient performed toilet hygiene seated     Functional mobility during ADLs: Min guard General ADL Comments: hand held/min guard assist for mobility in room     Vision       Perception     Praxis      Cognition Arousal/Alertness: Awake/alert Behavior During Therapy: WFL for tasks assessed/performed;Flat affect Overall Cognitive Status: Within Functional Limits for tasks assessed                                 General Comments: patient eager to participate, wanted to move        Exercises     Shoulder Instructions       General Comments  Pertinent Vitals/ Pain       Pain Assessment: Faces Faces Pain Scale: Hurts a little bit Pain Location: left knee Pain Descriptors / Indicators: Grimacing;Discomfort Pain Intervention(s): Monitored during session  Home Living                                          Prior Functioning/Environment              Frequency  Min 2X/week        Progress Toward Goals  OT Goals(current goals can now be found in the care plan section)  Progress towards OT goals: Progressing toward  goals  Acute Rehab OT Goals Patient Stated Goal: return home OT Goal Formulation: With patient Time For Goal Achievement: 08/30/21 Potential to Achieve Goals: Good ADL Goals Pt Will Perform Grooming: with modified independence;standing Pt Will Perform Lower Body Bathing: with modified independence;with adaptive equipment;sit to/from stand Pt Will Perform Lower Body Dressing: with modified independence;sit to/from stand;with adaptive equipment Pt Will Transfer to Toilet: with modified independence;ambulating Pt Will Perform Toileting - Clothing Manipulation and hygiene: with modified independence;sit to/from stand Pt Will Perform Tub/Shower Transfer: with supervision;ambulating;shower seat;rolling walker;Shower transfer Additional ADL Goal #1: Pt will generalize energy conservation strategies in ADL and mobility independently.  Plan Discharge plan remains appropriate    Co-evaluation                 AM-PAC OT "6 Clicks" Daily Activity     Outcome Measure   Help from another person eating meals?: None Help from another person taking care of personal grooming?: A Little Help from another person toileting, which includes using toliet, bedpan, or urinal?: A Little Help from another person bathing (including washing, rinsing, drying)?: A Lot Help from another person to put on and taking off regular upper body clothing?: A Little Help from another person to put on and taking off regular lower body clothing?: A Lot 6 Click Score: 17    End of Session    OT Visit Diagnosis: Other abnormalities of gait and mobility (R26.89)   Activity Tolerance Patient tolerated treatment well   Patient Left in bed;with call bell/phone within reach   Nurse Communication Mobility status        Time: 5859-2924 OT Time Calculation (min): 34 min  Charges: OT General Charges $OT Visit: 1 Visit OT Treatments $Self Care/Home Management : 23-37 mins  Lodema Hong, [place]  Pager [phone] Office [place] 08/28/2021, 9:53 AM

## 2021-08-28 NOTE — Therapy (Signed)
Met Dr. Amadeo Garnet today and went over the side effects of bortezomib and cyclophosphamide with the patient.  Consent obtained from patient and attestation completed by physician.  Called Dr. Marin Olp who went over the protocol and treatment plan with him. He also told Dr. Ardelle Anton he would be in early to see him tomorrow. Premeds given before treatment and chemotherapy double checked with chemo certified RN at [facility] via facetime. Treatment tolerated well by patient.  Gardiner Rhyme, RN

## 2021-08-28 NOTE — Progress Notes (Signed)
   08/28/21 1122  Mobility  Activity Refused mobility (Declined stated he's going to HD anytime now)

## 2021-08-29 DIAGNOSIS — N049 Nephrotic syndrome with unspecified morphologic changes: Secondary | ICD-10-CM | POA: Diagnosis not present

## 2021-08-29 LAB — CBC WITH DIFFERENTIAL/PLATELET
Abs Immature Granulocytes: 0.04 10*3/uL (ref 0.00–0.07)
Basophils Absolute: 0 10*3/uL (ref 0.0–0.1)
Basophils Relative: 0 %
Eosinophils Absolute: 0 10*3/uL (ref 0.0–0.5)
Eosinophils Relative: 0 %
HCT: 28.9 % — ABNORMAL LOW (ref 39.0–52.0)
Hemoglobin: 9.5 g/dL — ABNORMAL LOW (ref 13.0–17.0)
Immature Granulocytes: 1 %
Lymphocytes Relative: 33 %
Lymphs Abs: 2 10*3/uL (ref 0.7–4.0)
MCH: 29 pg (ref 26.0–34.0)
MCHC: 32.9 g/dL (ref 30.0–36.0)
MCV: 88.1 fL (ref 80.0–100.0)
Monocytes Absolute: 0.3 10*3/uL (ref 0.1–1.0)
Monocytes Relative: 4 %
Neutro Abs: 3.8 10*3/uL (ref 1.7–7.7)
Neutrophils Relative %: 62 %
Platelets: 312 10*3/uL (ref 150–400)
RBC: 3.28 MIL/uL — ABNORMAL LOW (ref 4.22–5.81)
RDW: 15.1 % (ref 11.5–15.5)
WBC: 6.2 10*3/uL (ref 4.0–10.5)
nRBC: 0 % (ref 0.0–0.2)

## 2021-08-29 LAB — COMPREHENSIVE METABOLIC PANEL
ALT: 25 U/L (ref 0–44)
AST: 29 U/L (ref 15–41)
Albumin: 2.1 g/dL — ABNORMAL LOW (ref 3.5–5.0)
Alkaline Phosphatase: 69 U/L (ref 38–126)
Anion gap: 11 (ref 5–15)
BUN: 60 mg/dL — ABNORMAL HIGH (ref 8–23)
CO2: 25 mmol/L (ref 22–32)
Calcium: 8.5 mg/dL — ABNORMAL LOW (ref 8.9–10.3)
Chloride: 98 mmol/L (ref 98–111)
Creatinine, Ser: 4.92 mg/dL — ABNORMAL HIGH (ref 0.61–1.24)
GFR, Estimated: 12 mL/min — ABNORMAL LOW (ref >60)
Glucose, Bld: 161 mg/dL — ABNORMAL HIGH (ref 70–99)
Potassium: 4.3 mmol/L (ref 3.5–5.1)
Sodium: 134 mmol/L — ABNORMAL LOW (ref 135–145)
Total Bilirubin: 0.3 mg/dL (ref 0.3–1.2)
Total Protein: 4.9 g/dL — ABNORMAL LOW (ref 6.5–8.1)

## 2021-08-29 LAB — KAPPA/LAMBDA LIGHT CHAINS
Kappa free light chain: 89.1 mg/L — ABNORMAL HIGH (ref 3.3–19.4)
Kappa, lambda light chain ratio: 0.04 — ABNORMAL LOW (ref 0.26–1.65)
Lambda free light chains: 2345.2 mg/L — ABNORMAL HIGH (ref 5.7–26.3)

## 2021-08-29 MED ORDER — DARBEPOETIN ALFA 200 MCG/0.4ML IJ SOSY
200.0000 ug | PREFILLED_SYRINGE | INTRAMUSCULAR | Status: DC
  Administered 2021-08-29: 200 ug via INTRAVENOUS
  Filled 2021-08-29: qty 0.4

## 2021-08-29 MED ORDER — HEPARIN SODIUM (PORCINE) 1000 UNIT/ML DIALYSIS
20.0000 [IU]/kg | INTRAMUSCULAR | Status: DC | PRN
  Administered 2021-08-29: 2200 [IU] via INTRAVENOUS_CENTRAL
  Filled 2021-08-29: qty 3

## 2021-08-29 MED ORDER — PANTOPRAZOLE SODIUM 40 MG PO TBEC
40.0000 mg | DELAYED_RELEASE_TABLET | Freq: Two times a day (BID) | ORAL | Status: DC
  Administered 2021-08-29 – 2021-09-01 (×7): 40 mg via ORAL
  Filled 2021-08-29 (×7): qty 1

## 2021-08-29 MED ORDER — SODIUM CHLORIDE 0.9 % IV SOLN
510.0000 mg | Freq: Once | INTRAVENOUS | Status: AC
  Administered 2021-08-29: 510 mg via INTRAVENOUS
  Filled 2021-08-29: qty 17

## 2021-08-29 MED ORDER — HEPARIN SODIUM (PORCINE) 1000 UNIT/ML IJ SOLN
INTRAMUSCULAR | Status: AC
  Administered 2021-08-29: 3200 [IU] via INTRAVENOUS
  Filled 2021-08-29: qty 4

## 2021-08-29 MED ORDER — HEPARIN SODIUM (PORCINE) 1000 UNIT/ML IJ SOLN: 3200.0000 [IU] | Freq: Once | INTRAMUSCULAR | Status: AC

## 2021-08-29 MED ORDER — ONDANSETRON HCL 4 MG PO TABS
8.0000 mg | ORAL_TABLET | Freq: Two times a day (BID) | ORAL | Status: DC
  Administered 2021-08-29 – 2021-09-01 (×6): 8 mg via ORAL
  Filled 2021-08-29 (×7): qty 2

## 2021-08-29 MED ORDER — DIPHENOXYLATE-ATROPINE 2.5-0.025 MG PO TABS
1.0000 | ORAL_TABLET | Freq: Four times a day (QID) | ORAL | Status: DC | PRN
  Administered 2021-08-29 – 2021-08-30 (×3): 1 via ORAL
  Filled 2021-08-29 (×3): qty 1

## 2021-08-29 NOTE — Plan of Care (Signed)
Patient progressing 

## 2021-08-29 NOTE — Progress Notes (Signed)
[facility] NEPHROLOGY PROGRESS NOTE  Assessment/ Plan:  #dialysis dependent AKI 2/2 AL Amyloidosis; likely ESRD:  outpatient HD arranged at [facility] on TTS schedule, need to arrive at 10:45 AM in first treatment.  He has tunneled HD catheter for the access. HD THS while here: 3-4L UF, IVB albumin prn, 2-3K, 67F, TDC, Tight heparin If has sig IDH after DC of hydral/nitrate, will need to add midodrine  # AL Amyloidosis: Dr. Marin Olp is following and started combination of Velcade, Cytoxan and daratumumab on 10/18  # Anasarca: Secondary to worsening renal function/nephrotic syndrome from amyloidosis. Hepatic and cardiac involvement in amyloidosis likely are contributory. Continue HD/UF.  UF as able as above  #  Urinary tract infection: Per TRH  #  Anemia of chronic illness: Without overt blood loss, monitor hemoglobin.  No ESA because of myeloma.  Subjective:  Seen on HD: Goal UF 4L, TDC, Qb 450, 2K bath; tolerating well No interval events Received Chemotherapy yesterday  Objective Vital signs in last 24 hours: Vitals:   08/28/21 1952 08/29/21 0617 08/29/21 0801 08/29/21 0830  BP: (!) 99/49 (!) 112/54 (!) 159/78 134/69  Pulse: 87 73 83 71  Resp: 17 15 (!) 25 19  Temp: 98.4 F (36.9 C) 98.7 F (37.1 C) 98.6 F (37 C)   TempSrc: Oral Oral Oral   SpO2: 90% 93%    Weight:   111.5 kg   Height:       Weight change:   Intake/Output Summary (Last 24 hours) at 08/29/2021 0901 Last data filed at 08/28/2021 2059 Gross per 24 hour  Intake 240 ml  Output --  Net 240 ml        Labs: Basic Metabolic Panel: Recent Labs  Lab 08/25/21 0318 08/28/21 0321 08/29/21 0303  NA 136 133* 134*  K 4.6 3.5 4.3  CL 103 99 98  CO2 $Re'22 26 25  'fVF$ GLUCOSE 80 78 161*  BUN 54* 47* 60*  CREATININE 3.28* 3.78* 4.92*  CALCIUM 8.0* 8.1* 8.5*    Liver Function Tests: Recent Labs  Lab 08/28/21 0321 08/29/21 0303  AST 24 29  ALT 22 25  ALKPHOS 59 69  BILITOT 0.5 0.3  PROT  4.3* 4.9*  ALBUMIN 1.9* 2.1*    No results for input(s): LIPASE, AMYLASE in the last 168 hours. No results for input(s): AMMONIA in the last 168 hours. CBC: Recent Labs  Lab 08/25/21 0318 08/28/21 0321 08/29/21 0303  WBC 8.6 6.1 6.2  NEUTROABS  --  2.6 3.8  HGB 10.0* 8.2* 9.5*  HCT 30.1* 25.2* 28.9*  MCV 87.2 88.7 88.1  PLT 228 263 312    Cardiac Enzymes: No results for input(s): CKTOTAL, CKMB, CKMBINDEX, TROPONINI in the last 168 hours. CBG: No results for input(s): GLUCAP in the last 168 hours.  Iron Studies: No results for input(s): IRON, TIBC, TRANSFERRIN, FERRITIN in the last 72 hours. Studies/Results: No results found.  Medications: Infusions:  sodium chloride 10 mL/hr at 08/24/21 0012   sodium chloride 10 mL/hr at 08/18/21 2046   ferumoxytol      Scheduled Medications:  acyclovir  200 mg Oral BID   atorvastatin  40 mg Oral Daily   Chlorhexidine Gluconate Cloth  6 each Topical Daily   Chlorhexidine Gluconate Cloth  6 each Topical Q0600   darbepoetin (ARANESP) injection - DIALYSIS  200 mcg Intravenous Q Tue-HD   ezetimibe  10 mg Oral QPC supper   heparin  5,000 Units Subcutaneous Q8H   ondansetron  8  mg Oral Q12H   pantoprazole  40 mg Oral BID   sodium chloride flush  3 mL Intravenous Q12H   tamsulosin  0.4 mg Oral Daily    have reviewed scheduled and prn medications.  Physical Exam: General: Pleasant male, lying on bed comfortable. Heart:RRR, s1s2 nl Lungs: Clear lungs bilateral, no increased work of breathing. Abdomen:soft, Non-tender Extremities: Anasarca with edematous both upper and lower extremities, not much change from yesterday Dialysis Access: Right IJ TDC in place.  [place] 08/29/2021,9:01 AM  LOS: 15 days

## 2021-08-29 NOTE — Progress Notes (Signed)
I am totally indebted to [facility] for him and over yesterday to do the chemotherapy.  I had talked to Dr.Mcree on Friday about the chemotherapy protocol and side effects.  I realize that he is certainly have forgotten about this given all that he is going through.  I went over with him again the side effects.  He said he had some nausea and vomiting this morning.  He had a little bit of diarrhea.  I will make sure that he is on Zofran on a schedule.  I told him that the chemotherapy protocol is once a week for 3 weeks on and 1 week off.  If we can get him as an outpatient, I would then add daratumumab to the protocol which really does help with respect to light chain myeloma.  I told him that even with treatment, his renal function certainly may not improve and that dialysis may be a chronic event for him.  I think he does have a good understanding.  I will make sure he is on Aranesp.  I will also give him a dose of iron again.  I think this will help with his anemia.  Today, his white cell count 6.2.  Hemoglobin 9.5.  Platelet count 312,000.  His sodium is 134 potassium 4.3.  BUN 60 creatinine 4.92.  Calcium 8.5 with an albumin of 2.1.  I know this is a very complicated situation.  He did have the bone marrow biopsy done.  I do not have back the cytogenetics or the FISH panel.  I think this will be very helpful.  At least, we started him on treatment.  Hopefully this will help bring down the light chains.  Again I do not think is going to have a major impact on his renal function.  I know the staff on [address] are doing a fantastic job with him.  I really do appreciate all of their hard work and compassion.  Lattie Haw, MD  Psalms 23:4

## 2021-08-29 NOTE — Progress Notes (Signed)
PROGRESS NOTE    Corey Beard  RXV:[id number] DOB: Oct 23, 1946 DOA: 08/14/2021 PCP: Benito Mccreedy, MD    Brief Narrative:  75 y/o African American male, retired physician with hypertension, type 2 DM, h/o prostate cancer, tobacco use, carotid stenosis, nonobstructive CAD, now admitted with progressive anasarca. He is found to have bilateral pleural effusions R>L, with recent 2 L thoracentesis performed on 08/07/2021. Cr  on admission up at 3.3 from baseline of 1.2-1.3 a year ago. He has 3+ proteinuria. He was initially admitted by cardiology, transferred to [facility].  Nephrology consulted for evaluation of nephrotic syndrome.  He underwent successful placement of tunneled HD catheter on 08/21/2021. He underwent bone marrow biopsy and renal biopsy on 10/11 and started on hemodialysis.   Bone marrow biopsy consistent with myeloma.  Kidney biopsy with amyloidosis.  Oncology suggested picture consistent with lambda light chain myeloma.   Assessment & Plan:   Principal Problem:   Nephrotic syndrome Active Problems:   Malignant neoplasm of prostate (HCC)   Atherosclerotic heart disease of native coronary artery without angina pectoris   Essential hypertension   Dyslipidemia   Amyloid light chain nephropathy (HCC)   Lambda light chain myeloma (HCC)   Goals of care, counseling/discussion  Anasarca with bilateral pleural effusions/proteinuria/acute kidney injury ascites and hypoalbuminemia: Nephrotic syndrome secondary to amyloidosis. Echocardiogram was essentially normal.  Ultrasound of the kidneys normal-appearing kidneys with exception of left lower pole renal cyst. Due to significant uremia and fluid overload, started on hemodialysis 10/11.  Patient is now on a scheduled hemodialysis TTS schedule.  Remains fluid overloaded.  Lambda light chain myeloma: Seen by oncology.  Recommended inpatient chemotherapy starting  10/17.  Followed by Dr. Marin Olp.  Monitor in the hospital.  Next  chemotherapy will be outpatient next week.  Essential hypertension: Blood pressure optimal controlled on hydralazine, Imdur and Lasix.  Coronary artery disease: Currently asymptomatic.  BPH: On Flomax.  Abnormal urinalysis: Final culture with multiple species.  Completed antibiotic therapy.  Continue mobilizing.  He still edematous with fluid overload.  Anticipating discharge Thursday or Friday with arrangements of care at home.  DVT prophylaxis: heparin injection 5,000 Units Start: 08/23/21 1000  Heparin subcu   Code Status: Partial Family Communication: None.  Patient is communicating with family. Disposition Plan: Status is: Inpatient  Remains inpatient appropriate because:IV treatments appropriate due to intensity of illness or inability to take PO and Inpatient level of care appropriate due to severity of illness  Dispo: The patient is from: Home              Anticipated d/c is to: Home with home health and DME.              Patient currently is not medically stable to d/c.   Difficult to place patient No         Consultants:  Cardiology Nephrology Oncology  Procedures:  Renal biopsy, bone marrow biopsy 10/11 Right permacath 10/10.  Antimicrobials:  Rocephin.  Completed antibiotics.   Subjective: Seen and examined.  Receiving hemodialysis.  He had some nausea and one episode of diarrhea after chemotherapy yesterday.  Otherwise no complaints.  He thinks he will have adequate family support by Friday.  Objective: Vitals:   08/29/21 1030 08/29/21 1100 08/29/21 1130 08/29/21 1200  BP: (!) 125/56 (!) 124/57 (!) 125/58 (!) 115/53  Pulse: 64   (!) 59  Resp: $Remo'10 19 12   'WFFmV$ Temp:      TempSrc:      SpO2:  Weight:      Height:        Intake/Output Summary (Last 24 hours) at 08/29/2021 1256 Last data filed at 08/28/2021 2059 Gross per 24 hour  Intake 240 ml  Output --  Net 240 ml   Filed Weights   08/27/21 0500 08/28/21 0332 08/29/21 0801  Weight: 109.5  kg 109.4 kg 111.5 kg    Examination:  Patient looks comfortable.  Generalized anasarca.  Receiving hemodialysis now. Right arm which is huge and edematous.  No redness or erythema. Permacath present on right chest. On room air looks comfortable.  Data Reviewed: I have personally reviewed following labs and imaging studies  CBC: Recent Labs  Lab 08/25/21 0318 08/28/21 0321 08/29/21 0303  WBC 8.6 6.1 6.2  NEUTROABS  --  2.6 3.8  HGB 10.0* 8.2* 9.5*  HCT 30.1* 25.2* 28.9*  MCV 87.2 88.7 88.1  PLT 228 263 325   Basic Metabolic Panel: Recent Labs  Lab 08/23/21 0257 08/25/21 0318 08/28/21 0321 08/29/21 0303  NA 135 136 133* 134*  K 4.3 4.6 3.5 4.3  CL 103 103 99 98  CO2 21* $Remov'22 26 25  'NgUesG$ GLUCOSE 104* 80 78 161*  BUN 84* 54* 47* 60*  CREATININE 4.03* 3.28* 3.78* 4.92*  CALCIUM 7.6* 8.0* 8.1* 8.5*  MG  --  2.2  --   --    GFR: Estimated Creatinine Clearance: 17.5 mL/min (A) (by C-G formula based on SCr of 4.92 mg/dL (H)). Liver Function Tests: Recent Labs  Lab 08/28/21 0321 08/29/21 0303  AST 24 29  ALT 22 25  ALKPHOS 59 69  BILITOT 0.5 0.3  PROT 4.3* 4.9*  ALBUMIN 1.9* 2.1*   No results for input(s): LIPASE, AMYLASE in the last 168 hours. No results for input(s): AMMONIA in the last 168 hours. Coagulation Profile: No results for input(s): INR, PROTIME in the last 168 hours.  Cardiac Enzymes: No results for input(s): CKTOTAL, CKMB, CKMBINDEX, TROPONINI in the last 168 hours. BNP (last 3 results) No results for input(s): PROBNP in the last 8760 hours. HbA1C: No results for input(s): HGBA1C in the last 72 hours. CBG: No results for input(s): GLUCAP in the last 168 hours. Lipid Profile: No results for input(s): CHOL, HDL, LDLCALC, TRIG, CHOLHDL, LDLDIRECT in the last 72 hours. Thyroid Function Tests: No results for input(s): TSH, T4TOTAL, FREET4, T3FREE, THYROIDAB in the last 72 hours. Anemia Panel: No results for input(s): VITAMINB12, FOLATE, FERRITIN,  TIBC, IRON, RETICCTPCT in the last 72 hours. Sepsis Labs: No results for input(s): PROCALCITON, LATICACIDVEN in the last 168 hours.  No results found for this or any previous visit (from the past 240 hour(s)).        Radiology Studies: No results found.      Scheduled Meds:  acyclovir  200 mg Oral BID   atorvastatin  40 mg Oral Daily   Chlorhexidine Gluconate Cloth  6 each Topical Daily   Chlorhexidine Gluconate Cloth  6 each Topical Q0600   darbepoetin (ARANESP) injection - DIALYSIS  200 mcg Intravenous Q Tue-HD   ezetimibe  10 mg Oral QPC supper   heparin  5,000 Units Subcutaneous Q8H   heparin sodium (porcine)       heparin sodium (porcine)  3,200 Units Intravenous Once   ondansetron  8 mg Oral Q12H   pantoprazole  40 mg Oral BID   sodium chloride flush  3 mL Intravenous Q12H   tamsulosin  0.4 mg Oral Daily   Continuous Infusions:  sodium chloride  10 mL/hr at 08/24/21 0012   sodium chloride 10 mL/hr at 08/18/21 2046     LOS: 15 days    Time spent: 25 minutes    Barb Merino, MD [facility] Pager [phone]

## 2021-08-29 NOTE — Progress Notes (Signed)
Chart reviewed. Possible d/c date of Thursday or Friday noted in MD note for today. Contacted [facility] and spoke to Hensley, [employer] manager. Corey Beard is agreeable to pt starting at out-pt clinic Saturday if pt stable for d/c end of the week. Will follow and assist.  Melven Sartorius Renal Navigator [phone]

## 2021-08-30 DIAGNOSIS — N049 Nephrotic syndrome with unspecified morphologic changes: Secondary | ICD-10-CM | POA: Diagnosis not present

## 2021-08-30 DIAGNOSIS — N08 Glomerular disorders in diseases classified elsewhere: Secondary | ICD-10-CM

## 2021-08-30 DIAGNOSIS — E8581 Light chain (AL) amyloidosis: Secondary | ICD-10-CM

## 2021-08-30 LAB — CBC WITH DIFFERENTIAL/PLATELET
Abs Immature Granulocytes: 0.04 10*3/uL (ref 0.00–0.07)
Basophils Absolute: 0 10*3/uL (ref 0.0–0.1)
Basophils Relative: 0 %
Eosinophils Absolute: 0.1 10*3/uL (ref 0.0–0.5)
Eosinophils Relative: 1 %
HCT: 27.4 % — ABNORMAL LOW (ref 39.0–52.0)
Hemoglobin: 8.9 g/dL — ABNORMAL LOW (ref 13.0–17.0)
Immature Granulocytes: 1 %
Lymphocytes Relative: 29 %
Lymphs Abs: 2.5 10*3/uL (ref 0.7–4.0)
MCH: 28.6 pg (ref 26.0–34.0)
MCHC: 32.5 g/dL (ref 30.0–36.0)
MCV: 88.1 fL (ref 80.0–100.0)
Monocytes Absolute: 1 10*3/uL (ref 0.1–1.0)
Monocytes Relative: 12 %
Neutro Abs: 5 10*3/uL (ref 1.7–7.7)
Neutrophils Relative %: 57 %
Platelets: 306 10*3/uL (ref 150–400)
RBC: 3.11 MIL/uL — ABNORMAL LOW (ref 4.22–5.81)
RDW: 15.4 % (ref 11.5–15.5)
WBC: 8.8 10*3/uL (ref 4.0–10.5)
nRBC: 0 % (ref 0.0–0.2)

## 2021-08-30 LAB — COMPREHENSIVE METABOLIC PANEL
ALT: 24 U/L (ref 0–44)
AST: 24 U/L (ref 15–41)
Albumin: 2.1 g/dL — ABNORMAL LOW (ref 3.5–5.0)
Alkaline Phosphatase: 61 U/L (ref 38–126)
Anion gap: 10 (ref 5–15)
BUN: 38 mg/dL — ABNORMAL HIGH (ref 8–23)
CO2: 26 mmol/L (ref 22–32)
Calcium: 8.1 mg/dL — ABNORMAL LOW (ref 8.9–10.3)
Chloride: 97 mmol/L — ABNORMAL LOW (ref 98–111)
Creatinine, Ser: 4.13 mg/dL — ABNORMAL HIGH (ref 0.61–1.24)
GFR, Estimated: 14 mL/min — ABNORMAL LOW (ref >60)
Glucose, Bld: 78 mg/dL (ref 70–99)
Potassium: 3.9 mmol/L (ref 3.5–5.1)
Sodium: 133 mmol/L — ABNORMAL LOW (ref 135–145)
Total Bilirubin: 0.4 mg/dL (ref 0.3–1.2)
Total Protein: 4.8 g/dL — ABNORMAL LOW (ref 6.5–8.1)

## 2021-08-30 NOTE — Progress Notes (Signed)
[facility]                                                                              Patient Demographics  Corey Beard, is a 75 y.o. male, DOB - 09-30-1946, QBH:[id number]  Admit date - 08/14/2021   Admitting Physician Hosie Poisson, MD  Outpatient Primary MD for the patient is Benito Mccreedy, MD  Outpatient specialists:   LOS - 16  days   Medical records reviewed and are as summarized below:    No chief complaint on file.      Brief summary   Patient is a 75 year old African-American male, retired physician with hypertension, diabetes type 2, history of prostate CA, tobacco use, carotid stenosis, nonobstructive CAD, was admitted with progressive anasarca.  He was found to have bilateral pleural effusions R>L, had undergone 2 L thoracentesis on 08/07/2021.  Creatinine on admission 3.3 from baseline of 1.2-1.3 a year ago.  3+ proteinuria.  He was initially admitted by cardiology then transferred to [facility]. Nephrology was consulted for evaluation of nephrotic syndrome, underwent tunneled HD catheter placement on 10/10.  He underwent bone marrow biopsy and renal biopsy on 10/11 and was started on hemodialysis. Bone marrow biopsy was consistent with myeloma, kidney biopsy with amyloidosis.   Assessment & Plan    Principal Problem:   Nephrotic syndrome with anasarca, ascites, bilateral pleural effusions, proteinuria, acute kidney injury, hypoalbuminemia -Nephrotic syndrome secondary to amyloidosis, multiple myeloma. -2D echocardiogram showed normal EF -Renal ultrasound showed normal-appearing kidneys with exception of left lower pole renal cyst -Due to significant uremia and fluid overload, started on HD on 10/11 with TTS schedule -Plan for hemodialysis on Thursday 10/20, next HD at [facility] on Saturday.   Active Problems: Amyloidosis, lambda light chain myeloma -Oncology following, received fourth cycle of treatment with  Cytoxan/Velcade on Monday, 10/17.  Next cycle will be next week outpatient in the office.  Essential hypertension -BP stable  CAD -Currently asymptomatic, no chest pain or acute shortness of breath  BPH -Continue Flomax  Obesity Estimated body mass index is 31.15 kg/m as calculated from the following:   Height as of this encounter: $RemoveBeforeD'6\' 1"'wYmlMznzLUAILV$  (1.854 m).   Weight as of this encounter: 107.1 kg.  Code Status: Partial DVT Prophylaxis:  heparin injection 5,000 Units Start: 08/23/21 1000   Level of Care: Level of care: Telemetry Cardiac Family Communication: Discussed all imaging results, lab results, explained to the patient   Disposition Plan:     Status is: Inpatient  Remains inpatient appropriate because: Plan for HD in a.m and DC home on Friday a.m.  Patient requested a hospital bed.    Time Spent in minutes  35 minutes  Procedures:  Renal biopsy, bone marrow biopsy 10/11 Right permacath 10/10.  Consultants:   Cardiology Nephrology Oncology  Antimicrobials:   Anti-infectives (From admission, onward)    Start     Dose/Rate Route Frequency Ordered Stop   08/28/21 1030  acyclovir (ZOVIRAX) tablet 200 mg        200 mg Oral 2 times daily 08/28/21 0935     08/21/21 1203  ceFAZolin (ANCEF) 2-4 GM/100ML-% IVPB  Status:  Discontinued       Note to Pharmacy: Rhunette Croft   : cabinet override      08/21/21 1203 08/21/21 1220   08/19/21 0830  cefTRIAXone (ROCEPHIN) 1 g in sodium chloride 0.9 % 100 mL IVPB        1 g 200 mL/hr over 30 Minutes Intravenous Every 24 hours 08/19/21 0739 08/21/21 0843          Medications  Scheduled Meds:  acyclovir  200 mg Oral BID   atorvastatin  40 mg Oral Daily   Chlorhexidine Gluconate Cloth  6 each Topical Daily   Chlorhexidine Gluconate Cloth  6 each Topical Q0600   darbepoetin (ARANESP) injection - DIALYSIS  200 mcg Intravenous Q Tue-HD   ezetimibe  10 mg Oral QPC supper   heparin  5,000 Units Subcutaneous Q8H   ondansetron   8 mg Oral Q12H   pantoprazole  40 mg Oral BID   sodium chloride flush  3 mL Intravenous Q12H   tamsulosin  0.4 mg Oral Daily   Continuous Infusions:  sodium chloride 10 mL/hr at 08/24/21 0012   sodium chloride 10 mL/hr at 08/18/21 2046   PRN Meds:.sodium chloride, acetaminophen, diphenoxylate-atropine, fentaNYL, fentaNYL, Gerhardt's butt cream, midazolam, midazolam, sodium chloride flush      Subjective:   Corey Beard was seen and examined today.  Eating breakfast, no acute complaints.  Hoping to be discharged on Friday a.m. patient denies dizziness, chest pain, shortness of breath, abdominal pain, N/V. No acute events overnight.    Objective:   Vitals:   08/29/21 1230 08/29/21 1634 08/29/21 1933 08/30/21 0329  BP: (!) 128/57 (!) 113/47 109/67 (!) 122/56  Pulse: 65 71 62 64  Resp: 15 19 15 16   Temp: 98.3 F (36.8 C) 98.1 F (36.7 C) 97.8 F (36.6 C) 98.1 F (36.7 C)  TempSrc: Oral Oral Oral Oral  SpO2: 90% 97% 99% 97%  Weight: 107.5 kg   107.1 kg  Height:        Intake/Output Summary (Last 24 hours) at 08/30/2021 1024 Last data filed at 08/30/2021 09/01/2021 Gross per 24 hour  Intake 1080 ml  Output 4024 ml  Net -2944 ml     Wt Readings from Last 3 Encounters:  08/30/21 107.1 kg  06/23/21 103.3 kg  05/12/21 104.7 kg     Exam General: Alert and oriented x 3, NAD Cardiovascular: S1 S2 auscultated, RRR Respiratory: Fairly CTA B Gastrointestinal: Soft, nontender,  + bowel sounds Ext: generalized anasarca improving Neuro: no new deficits    Data Reviewed:  I have personally reviewed following labs and imaging studies  Micro Results No results found for this or any previous visit (from the past 240 hour(s)).  Radiology Reports DG Chest 1 View  Result Date: 08/08/2021 CLINICAL DATA:  75 year old status post right-sided thoracentesis EXAM: CHEST  1 VIEW COMPARISON:  CT 08/02/2021 FINDINGS: Cardiomediastinal silhouette within normal limits in size and  contour. Heart borders partially obscured by overlying lung/pleural disease. Calcifications of the aortic arch. Meniscus at the bilateral lung bases, larger on the left. No pneumothorax. No confluent airspace disease.  No interlobular septal thickening. No displaced fracture IMPRESSION: No complicating features status post right-sided thoracentesis. Small bilateral pleural effusions persist. Electronically Signed   By: 08/04/2021 D.O.   On: 08/08/2021 11:18   DG Chest 2 View  Result Date: 08/14/2021 CLINICAL DATA:  Dyspnea. EXAM: CHEST - 2 VIEW COMPARISON:  Chest x-ray 08/08/2021. FINDINGS: Heart is enlarged, unchanged. There  are atherosclerotic calcifications of the aorta. There are small bilateral pleural effusions, increasing from prior study. There is no evidence for pneumothorax. There some strandy opacities at the lung bases favored is atelectasis. No acute fractures are seen. IMPRESSION: 1. Increasing small bilateral pleural effusions. 2. Stable cardiomegaly. Electronically Signed   By: Ronney Asters M.D.   On: 08/14/2021 21:15   MR LIVER WO CONRTAST  Result Date: 08/18/2021 CLINICAL DATA:  Evaluate for cirrhosis. EXAM: MRI ABDOMEN WITHOUT CONTRAST TECHNIQUE: Multiplanar multisequence MR imaging was performed without the administration of intravenous contrast. COMPARISON:  08/16/2021 FINDINGS: Exam detail is markedly diminished due to a number of factors most importantly however is diffuse motion artifact. Lower chest: There are moderate bilateral pleural effusions with atelectasis within both lower lobes, the right middle lobe and lingula. Hepatobiliary: Imaging of the liver is significantly degraded by motion artifact as well as diffuse body wall edema and ascites. The study is not diagnostic for the evaluation of underlying liver lesion. Furthermore assessment of intrinsic liver abnormality cannot be adequately assessed. The gallbladder is visualized. No stones noted Pancreas:  Study not  diagnostic for the evaluation of the pancreas. Spleen:  Appears normal in size. Adrenals/Urinary Tract: Suboptimally evaluated. A cyst is noted arising off the inferior pole of the left kidney measuring 2.6 cm. Stomach/Bowel: Diffuse gaseous distension of the colon is noted. Vascular/Lymphatic: Study not diagnostic for the assessment of vascular structures or the presence or absence of adenopathy. Other: Abdominal ascites identified. Volume of ascites is difficult to quantify due to factors discussed above. Musculoskeletal: No acute abnormality noted. IMPRESSION: 1. Markedly diminished exam detail due to a number of factors most importantly however, there is diffuse motion artifact. This is most likely secondary to shortness of breath due to bilateral pleural effusions and overall fluid overload state. As mentioned on 08/16/2021, MRI should only be performed when the patient is clinically stable, able to follow directions, and hold their breath. If cross-sectional imaging of the liver is currently indicated on an emergent basis then liver protocol CT without and with contrast material may be more appropriate as this is less susceptible to respiratory motion artifact. 2. Moderate bilateral pleural effusions, ascites, and diffuse anasarca. 3. Diffuse gaseous distension of the colon. Electronically Signed   By: Kerby Moors M.D.   On: 08/18/2021 06:00   US RENAL  Result Date: 08/15/2021 CLINICAL DATA:  Nephrotic syndrome EXAM: RENAL / URINARY TRACT ULTRASOUND COMPLETE COMPARISON:  None. FINDINGS: Right Kidney: Renal measurements: 10.7 x 4.7 x 5.5 cm. = volume: 144 mL. Echogenicity within normal limits. No mass or hydronephrosis visualized. Left Kidney: Renal measurements: 11.2 x 6.8 x 5.2 cm. = volume: 208 mL. 2.7 cm lower pole cyst is noted in the left kidney. No mass lesion or hydronephrosis is noted. Bladder: Partially decompressed. The wall is thickened although likely related to the decompression. Other:  Diffuse ascites is noted within the abdomen and pelvis. IMPRESSION: Diffuse ascites within the abdomen and pelvis. Normal-appearing kidneys with the exception of a left lower pole renal cyst. Electronically Signed   By: Inez Catalina M.D.   On: 08/15/2021 20:02   IR Fluoro Guide CV Line Right  Result Date: 08/21/2021 INDICATION: End-stage renal disease. In need durable intravenous access for the initiation hemodialysis. EXAM: TUNNELED CENTRAL VENOUS HEMODIALYSIS CATHETER PLACEMENT WITH ULTRASOUND AND FLUOROSCOPIC GUIDANCE MEDICATIONS: The patient is currently admitted to the hospital receiving intravenous antibiotics. The antibiotic was given in an appropriate time interval prior to skin puncture. ANESTHESIA/SEDATION: Moderate (  conscious) sedation was employed during this procedure. A total of Versed 0.5 mg and Fentanyl 25 mcg was administered intravenously. Moderate Sedation Time: 15 minutes. The patient's level of consciousness and vital signs were monitored continuously by radiology nursing throughout the procedure under my direct supervision. FLUOROSCOPY TIME:  36 seconds (14 mGy) COMPLICATIONS: None immediate. PROCEDURE: Informed written consent was obtained from the patient after a discussion of the risks, benefits, and alternatives to treatment. Questions regarding the procedure were encouraged and answered. The right neck and chest were prepped with chlorhexidine in a sterile fashion, and a sterile drape was applied covering the operative field. Maximum barrier sterile technique with sterile gowns and gloves were used for the procedure. A timeout was performed prior to the initiation of the procedure. After creating a small venotomy incision, a micropuncture kit was utilized to access the internal jugular vein. Real-time ultrasound guidance was utilized for vascular access including the acquisition of a permanent ultrasound image documenting patency of the accessed vessel. The microwire was utilized to  measure appropriate catheter length. A stiff Glidewire was advanced to the level of the IVC and the micropuncture sheath was exchanged for a peel-away sheath. A palindrome tunneled hemodialysis catheter measuring 19 cm from tip to cuff was tunneled in a retrograde fashion from the anterior chest wall to the venotomy incision. The catheter was then placed through the peel-away sheath with tips ultimately positioned within the superior aspect of the right atrium. Final catheter positioning was confirmed and documented with a spot radiographic image. The catheter aspirates and flushes normally. The catheter was flushed with appropriate volume heparin dwells. The catheter exit site was secured with a 0-Prolene retention suture. The venotomy incision was closed with an interrupted 4-0 Vicryl, Dermabond and Steri-strips. Dressings were applied. The patient tolerated the procedure well without immediate post procedural complication. IMPRESSION: Successful placement of 19 cm tip to cuff tunneled hemodialysis catheter via the right internal jugular vein with tips terminating within the superior aspect of the right atrium. The catheter is ready for immediate use. Electronically Signed   By: Sandi Mariscal M.D.   On: 08/21/2021 14:57   IR US Guide Vasc Access Right  INDICATION: End-stage renal disease. In need durable intravenous access for the initiation hemodialysis.   EXAM: TUNNELED CENTRAL VENOUS HEMODIALYSIS CATHETER PLACEMENT WITH ULTRASOUND AND FLUOROSCOPIC GUIDANCE   MEDICATIONS: The patient is currently admitted to the hospital receiving intravenous antibiotics. The antibiotic was given in an appropriate time interval prior to skin puncture.   ANESTHESIA/SEDATION: Moderate (conscious) sedation was employed during this procedure. A total of Versed 0.5 mg and Fentanyl 25 mcg was administered intravenously.   Moderate Sedation Time: 15 minutes. The patient's level of consciousness and vital signs were monitored  continuously by radiology nursing throughout the procedure under my direct supervision.   FLUOROSCOPY TIME:  36 seconds (14 mGy)   COMPLICATIONS: None immediate.   PROCEDURE: Informed written consent was obtained from the patient after a discussion of the risks, benefits, and alternatives to treatment. Questions regarding the procedure were encouraged and answered. The right neck and chest were prepped with chlorhexidine in a sterile fashion, and a sterile drape was applied covering the operative field. Maximum barrier sterile technique with sterile gowns and gloves were used for the procedure. A timeout was performed prior to the initiation of the procedure.   After creating a small venotomy incision, a micropuncture kit was utilized to access the internal jugular vein. Real-time ultrasound guidance was utilized for vascular access  including the acquisition of a permanent ultrasound image documenting patency of the accessed vessel. The microwire was utilized to measure appropriate catheter length.   A stiff Glidewire was advanced to the level of the IVC and the micropuncture sheath was exchanged for a peel-away sheath. A palindrome tunneled hemodialysis catheter measuring 19 cm from tip to cuff was tunneled in a retrograde fashion from the anterior chest wall to the venotomy incision.   The catheter was then placed through the peel-away sheath with tips ultimately positioned within the superior aspect of the right atrium. Final catheter positioning was confirmed and documented with a spot radiographic image. The catheter aspirates and flushes normally. The catheter was flushed with appropriate volume heparin dwells.   The catheter exit site was secured with a 0-Prolene retention suture. The venotomy incision was closed with an interrupted 4-0 Vicryl, Dermabond and Steri-strips. Dressings were applied. The patient tolerated the procedure well without immediate post procedural complication.   IMPRESSION: Successful  placement of 19 cm tip to cuff tunneled hemodialysis catheter via the right internal jugular vein with tips terminating within the superior aspect of the right atrium. The catheter is ready for immediate use.     Electronically Signed   By: Sandi Mariscal M.D.   On: 08/21/2021 14:57  DG Bone Survey Met  Result Date: 08/19/2021 CLINICAL DATA:  Metastatic bone survey. EXAM: METASTATIC BONE SURVEY COMPARISON:  MRI of the abdomen August 17, 2021 FINDINGS: Skull: Normal bony mineralization. No focal lytic or blastic osseous lesion. Cervical Spine: Normal bony mineralization. No focal lytic or blastic osseous lesion. Thoracic Spine: Normal bony mineralization. No focal lytic or blastic osseous lesion. Chest: Normal bony mineralization. No focal lytic or blastic osseous lesion. Lumbar Spine: Normal bony mineralization. No focal lytic or blastic osseous lesion. Pelvis: Normal bony mineralization. No focal lytic or blastic osseous lesion. Right Upper Extremity: Normal bony mineralization. No focal lytic or blastic osseous lesion. Left Upper Extremity: Normal bony mineralization. No focal lytic or blastic osseous lesion. Right Lower Extremity: Normal bony mineralization. No focal lytic or blastic osseous lesion. Left Lower Extremity: Normal bony mineralization. No focal lytic or blastic osseous lesion. Additional Bilateral pleural effusions. Osteoarthritic changes of the bilateral shoulders. Calcific atherosclerotic disease of the carotid arteries, aorta and femoral arteries. IMPRESSION: No lytic or sclerotic osseous lesions seen throughout the skeletal survey. Electronically Signed   By: Fidela Salisbury M.D.   On: 08/19/2021 14:07   CT BONE MARROW BIOPSY & ASPIRATION  Result Date: 08/22/2021 INDICATION: Monoclonal gammopathy of unknown significance, amyloidosis EXAM: CT GUIDED LEFT ILIAC BONE MARROW ASPIRATION AND CORE BIOPSY Date:  08/22/2021 08/22/2021 10:32 am Radiologist:  M. Daryll Brod, MD Guidance:  CT  FLUOROSCOPY TIME:  Fluoroscopy Time: None. MEDICATIONS: 1% lidocaine local ANESTHESIA/SEDATION: 1.0 mg IV Versed; 80 mcg IV Fentanyl Moderate Sedation Time:  27 minute The patient was continuously monitored during the procedure by the interventional radiology nurse under my direct supervision. CONTRAST:  None. COMPLICATIONS: None PROCEDURE: Informed consent was obtained from the patient following explanation of the procedure, risks, benefits and alternatives. The patient understands, agrees and consents for the procedure. All questions were addressed. A time out was performed. The patient was positioned prone and non-contrast localization CT was performed of the pelvis to demonstrate the iliac marrow spaces. Maximal barrier sterile technique utilized including caps, mask, sterile gowns, sterile gloves, large sterile drape, hand hygiene, and Betadine prep. Under sterile conditions and local anesthesia, an 11 gauge coaxial bone biopsy needle was advanced  into the left iliac marrow space. Needle position was confirmed with CT imaging. Initially, bone marrow aspiration was performed. Next, the 11 gauge outer cannula was utilized to obtain a left iliac bone marrow core biopsy. Needle was removed. Hemostasis was obtained with compression. The patient tolerated the procedure well. Samples were prepared with the cytotechnologist. No immediate complications. IMPRESSION: CT guided left iliac bone marrow aspiration and core biopsy. Electronically Signed   By: Jerilynn Mages.  Shick M.D.   On: 08/22/2021 10:35   VAS US RENAL ARTERY DUPLEX  Result Date: 08/16/2021 ABDOMINAL VISCERAL Patient Name:  DENNEY SHEIN [facility]  Date of Exam:   08/16/2021 Medical Rec #: [id number]         Accession #:    [id number] Date of Birth: [birth year]          Patient Gender: M Patient Age:   70 years Exam Location:  [facility] Procedure:      VAS US RENAL ARTERY DUPLEX Referring Phys: [id number] LINDSAY A KRUSKA  -------------------------------------------------------------------------------- Indications: AKI High Risk Factors: Hypertension, Diabetes. Limitations: Limited study. MD only wanted the renal veins to assess patency. Comparison Study: No prior studies. Performing Technologist: Oliver Hum RVT  Examination Guidelines: A complete evaluation includes B-mode imaging, spectral Doppler, color Doppler, and power Doppler as needed of all accessible portions of each vessel. Bilateral testing is considered an integral part of a complete examination. Limited examinations for reoccurring indications may be performed as noted.  Duplex Findings:  Technologist observations: The right and left renal veins appear patent.  Summary:  *See table(s) above for measurements and observations.  Diagnosing physician: Harold Barban MD  Electronically signed by Harold Barban MD on 08/16/2021 at 4:50:07 PM.    Final    CT RENAL BIOPSY  Result Date: 08/22/2021 INDICATION: Renal failure, nephrotic syndrome, concern for amyloidosis EXAM: ULTRASOUND LEFT RENAL RANDOM CORE BIOPSY MEDICATIONS: 1% LIDOCAINE LOCAL ANESTHESIA/SEDATION: Moderate (conscious) sedation was employed during this procedure. A total of Versed 1 mg and Fentanyl 80 mcg was administered intravenously. Moderate Sedation Time: 27 minutes. The patient's level of consciousness and vital signs were monitored continuously by radiology nursing throughout the procedure under my direct supervision. FLUOROSCOPY TIME:  Fluoroscopy Time: NONE. COMPLICATIONS: None immediate. PROCEDURE: Informed written consent was obtained from the patient after a thorough discussion of the procedural risks, benefits and alternatives. All questions were addressed. Maximal Sterile Barrier Technique was utilized including caps, mask, sterile gowns, sterile gloves, sterile drape, hand hygiene and skin antiseptic. A timeout was performed prior to the initiation of the procedure. Patient position prone.  Previous imaging reviewed. Left kidney lower pole was localized and marked. Under sterile conditions and local anesthesia, a 15 gauge coaxial guide needle was advanced to the left kidney lower pole cortex. Needle position confirmed with ultrasound. Images obtained for documentation. 2 16 gauge core biopsies obtained through the access under direct ultrasound of the lower pole cortex. Samples were intact and non fragmented. These were placed in saline. Needle tract occluded with Gel-Foam. Postprocedure imaging demonstrates no hemorrhage or hematoma. Patient tolerated biopsy well. IMPRESSION: Successful ultrasound left kidney core biopsy Electronically Signed   By: Jerilynn Mages.  Shick M.D.   On: 08/22/2021 10:38   US Abdomen Limited RUQ (LIVER/GB)  Result Date: 08/16/2021 CLINICAL DATA:  Cirrhosis/ascites EXAM: ULTRASOUND ABDOMEN LIMITED RIGHT UPPER QUADRANT COMPARISON:  None. FINDINGS: Gallbladder: No gallstones or wall thickening visualized. No sonographic Murphy sign noted by sonographer. Common bile duct: Diameter: 1.7 mL Liver: Nodular hepatic contour. No focal lesion  identified. Decreased parenchymal echogenicity. Portal vein is patent on color Doppler imaging with normal direction of blood flow towards the liver. Other: At least small volume free fluid. At least trace volume right pleural effusion. IMPRESSION: 1. Cirrhosis. Recommend MRI liver protocol for further evaluation of the hepatic parenchyma. When the patient is clinically stable and able to follow directions and hold their breath (preferably as an outpatient) further evaluation with dedicated abdominal MRI should be considered. 2. At least small volume ascites. 3. At least trace volume right pleural effusion. Electronically Signed   By: Iven Finn M.D.   On: 08/16/2021 18:55   IR Paracentesis  Result Date: 08/18/2021 INDICATION: Patient with history of AKI, newly diagnosed cirrhosis, and anasarca. Request for diagnostic only paracentesis with a  maximum drain of 100 mL. EXAM: ULTRASOUND GUIDED DIAGNOSTIC PARACENTESIS MEDICATIONS: 10 mL 1% lidocaine COMPLICATIONS: None immediate. PROCEDURE: Informed written consent was obtained from the patient after a discussion of the risks, benefits and alternatives to treatment. A timeout was performed prior to the initiation of the procedure. Initial ultrasound scanning demonstrates a moderate amount of ascites within the right upper abdominal quadrant. The right upper abdomen was prepped and draped in the usual sterile fashion. 1% lidocaine was used for local anesthesia. Following this, a 6 Fr Safe-T-Centesis catheter was introduced. An ultrasound image was saved for documentation purposes. The paracentesis was performed. The catheter was removed and a dressing was applied. The patient tolerated the procedure well without immediate post procedural complication. FINDINGS: A total of approximately 100 mL of hazy, yellow fluid was removed. Samples were sent to the laboratory as requested by the clinical team. Per order maximum of 100 mL was removed from right upper quadrant even though there was a moderate amount of ascites noted on ultrasound images remaining. IMPRESSION: Successful ultrasound-guided paracentesis yielding 100 mL of peritoneal fluid. Read by: Narda Rutherford, NP Electronically Signed   By: Miachel Roux M.D.   On: 08/18/2021 11:11   IR THORACENTESIS ASP PLEURAL SPACE W/IMG GUIDE  Result Date: 08/08/2021 INDICATION: Pt with hx of CAD, HLD, HTN, prostate cancer and edema with recent anasarca and fluid overload with pleural effusions. Request for diagnostic and therapeutic thoracentesis. EXAM: ULTRASOUND GUIDED DIAGNOSTIC AND THERAPEUTIC THORACENTESIS MEDICATIONS: 33ml 1% lidocaine COMPLICATIONS: None immediate. PROCEDURE: An ultrasound guided thoracentesis was thoroughly discussed with the patient and questions answered. The benefits, risks, alternatives and complications were also discussed. The patient  understands and wishes to proceed with the procedure. Written consent was obtained. Ultrasound was performed to localize and mark an adequate pocket of fluid in the right chest. The area was then prepped and draped in the normal sterile fashion. 1% Lidocaine was used for local anesthesia. Under ultrasound guidance a 6 Fr Safe-T-Centesis catheter was introduced. Thoracentesis was performed. The catheter was removed and a dressing applied. FINDINGS: A total of approximately 2 L of clear, yellow fluid was removed. Samples were sent to the laboratory as requested by the clinical team. IMPRESSION: Successful ultrasound guided right thoracentesis yielding 2 L of pleural fluid. Electronically Signed   By: Corrie Mckusick D.O.   On: 08/08/2021 13:05    Lab Data:  CBC: Recent Labs  Lab 08/25/21 0318 08/28/21 0321 08/29/21 0303 08/30/21 0328  WBC 8.6 6.1 6.2 8.8  NEUTROABS  --  2.6 3.8 5.0  HGB 10.0* 8.2* 9.5* 8.9*  HCT 30.1* 25.2* 28.9* 27.4*  MCV 87.2 88.7 88.1 88.1  PLT 228 263 312 195   Basic Metabolic Panel: Recent Labs  Lab 08/25/21 0318 08/28/21 0321 08/29/21 0303 08/30/21 0328  NA 136 133* 134* 133*  K 4.6 3.5 4.3 3.9  CL 103 99 98 97*  CO2 $Re'22 26 25 26  'dUp$ GLUCOSE 80 78 161* 78  BUN 54* 47* 60* 38*  CREATININE 3.28* 3.78* 4.92* 4.13*  CALCIUM 8.0* 8.1* 8.5* 8.1*  MG 2.2  --   --   --    GFR: Estimated Creatinine Clearance: 20.5 mL/min (A) (by C-G formula based on SCr of 4.13 mg/dL (H)). Liver Function Tests: Recent Labs  Lab 08/28/21 0321 08/29/21 0303 08/30/21 0328  AST $Re'24 29 24  'bHt$ ALT $R'22 25 24  'dj$ ALKPHOS 59 69 61  BILITOT 0.5 0.3 0.4  PROT 4.3* 4.9* 4.8*  ALBUMIN 1.9* 2.1* 2.1*   No results for input(s): LIPASE, AMYLASE in the last 168 hours. No results for input(s): AMMONIA in the last 168 hours. Coagulation Profile: No results for input(s): INR, PROTIME in the last 168 hours. Cardiac Enzymes: No results for input(s): CKTOTAL, CKMB, CKMBINDEX, TROPONINI in the last 168  hours. BNP (last 3 results) No results for input(s): PROBNP in the last 8760 hours. HbA1C: No results for input(s): HGBA1C in the last 72 hours. CBG: No results for input(s): GLUCAP in the last 168 hours. Lipid Profile: No results for input(s): CHOL, HDL, LDLCALC, TRIG, CHOLHDL, LDLDIRECT in the last 72 hours. Thyroid Function Tests: No results for input(s): TSH, T4TOTAL, FREET4, T3FREE, THYROIDAB in the last 72 hours. Anemia Panel: No results for input(s): VITAMINB12, FOLATE, FERRITIN, TIBC, IRON, RETICCTPCT in the last 72 hours. Urine analysis:    Component Value Date/Time   COLORURINE YELLOW 08/15/2021 0756   APPEARANCEUR CLOUDY (A) 08/15/2021 0756   LABSPEC 1.013 08/15/2021 0756   PHURINE 5.0 08/15/2021 0756   GLUCOSEU NEGATIVE 08/15/2021 0756   HGBUR SMALL (A) 08/15/2021 0756   BILIRUBINUR NEGATIVE 08/15/2021 0756   KETONESUR NEGATIVE 08/15/2021 0756   PROTEINUR >=300 (A) 08/15/2021 0756   NITRITE NEGATIVE 08/15/2021 0756   LEUKOCYTESUR LARGE (A) 08/15/2021 0756     Florencia Zaccaro M.D. [facility] 08/30/2021, 10:24 AM  Available via Epic secure chat 7am-7pm After 7 pm, please refer to night coverage provider listed on amion.

## 2021-08-30 NOTE — Progress Notes (Signed)
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 PMH: HTN, DM2, prostate ca, tobacco use, carotid stenosis, non obstructive CAD, glaucoma, arthritis and MI.   OT comments  Pt demonstrated ability to walk about his room with supervision for safety and no device, groom in standing, manage toileting and perform LB ADL. Reviewed energy conservation strategies with pt verbalizing understanding. Pt is eager to go home.    Recommendations for follow up therapy are one component of a multi-disciplinary discharge planning process, led by the attending physician.  Recommendations may be updated based on patient status, additional functional criteria and insurance authorization.    Follow Up Recommendations  Home health OT    Equipment Recommendations  None recommended by OT    Recommendations for Other Services      Precautions / Restrictions Precautions Precautions: Fall       Mobility Bed Mobility               General bed mobility comments: in chair    Transfers Overall transfer level: Modified independent                    Balance Overall balance assessment: Needs assistance   Sitting balance-Leahy Scale: Good     Standing balance support: No upper extremity supported;During functional activity Standing balance-Leahy Scale: Good                             ADL either performed or assessed with clinical judgement   ADL Overall ADL's : Needs assistance/impaired     Grooming: Standing;Supervision/safety;Wash/dry hands       Lower Body Bathing: Administrator, [employer];Sit to/from stand       Lower Body Dressing: Supervision/safety;Sit to/from stand   Toilet Transfer: Supervision/safety;Ambulation    Toileting- Clothing Manipulation and Hygiene: Supervision/safety;Sitting/lateral lean       Functional mobility during ADLs: Supervision/safety       Vision       Perception     Praxis      Cognition Arousal/Alertness: Awake/alert Behavior During Therapy: WFL for tasks assessed/performed;Flat affect Overall Cognitive Status: Within Functional Limits for tasks assessed                                          Exercises     Shoulder Instructions       General Comments      Pertinent Vitals/ Pain       Pain Assessment: No/denies pain  Home Living                                          Prior Functioning/Environment              Frequency  Min 2X/week        Progress Toward Goals  OT Goals(current goals can now be found in the care plan section)  Progress towards OT goals: Progressing toward goals  Acute Rehab OT Goals Patient Stated Goal: return home OT Goal Formulation: With patient Time For Goal Achievement: 08/30/21 Potential  to Achieve Goals: Good  Plan Discharge plan remains appropriate    Co-evaluation                 AM-PAC OT "6 Clicks" Daily Activity     Outcome Measure   Help from another person eating meals?: None Help from another person taking care of personal grooming?: A Little Help from another person toileting, which includes using toliet, bedpan, or urinal?: A Little Help from another person bathing (including washing, rinsing, drying)?: A Little Help from another person to put on and taking off regular upper body clothing?: None Help from another person to put on and taking off regular lower body clothing?: A Little 6 Click Score: 20    End of Session Equipment Utilized During Treatment: Gait belt  OT Visit Diagnosis: Other abnormalities of gait and mobility (R26.89)   Activity Tolerance Patient tolerated treatment well   Patient Left in chair;with call bell/phone within  reach;with nursing/sitter in room   Nurse Communication          Time: 4132-4401 OT Time Calculation (min): 16 min  Charges: OT General Charges $OT Visit: 1 Visit OT Treatments $Self Care/Home Management : 8-22 mins  Nestor Lewandowsky, OTR/L Acute Rehabilitation Services Pager: [phone] Office: [phone]  Malka So 08/30/2021, 2:25 PM

## 2021-08-30 NOTE — Progress Notes (Signed)
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  He does not have any nausea.  The still little bit of diarrhea.  He had dialysis yesterday.  He still has a lot of swelling in his right arm.  Left arm has no swelling.  He has had no bleeding.  He has had no fever.  There is no issues with pain.  His lab work shows a white count of 8.8.  Hemoglobin 8.9.  Platelet count 306,000.  His BUN is 38 creatinine 4.1.  His calcium is 8.1 with an albumin of 2.1.  There is been no headache.  He has had no mouth sores.  His vital signs are temperature 98.1.  Pulse 64.  Blood pressure 122/56.  Oxygen saturation is 97%.  His lungs sound clear bilaterally.  He has good air movement bilaterally.  Cardiac exam regular rate and rhythm.  He has 1/6 systolic ejection murmur.  Oral exam shows no mucositis.  Abdomen is somewhat obese.  He has looks like a abdominal wall hernia.  Bowel sounds are present.  Extremities shows a marked edema in his right arm.  There is really has no swelling of the left arm.  He has some edema in his legs.  Neurological exam is nonfocal.  He had his first cycle of treatment with Cytoxan/Velcade on Monday.  He will do his next cycle next Monday.  This might be in our office.  I will have to try to add daratumumab.  I think this would be very helpful.  He might go home in 2 days.  I know he is gotten incredible care from all the staff of [facility].  Lattie Haw, MD  Psalms 23:4

## 2021-08-30 NOTE — Progress Notes (Signed)
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  He has tunneled HD catheter for the access. HD THS while here: 4L UF, IVB albumin prn, 2-3K, 50F, TDC, Tight heparin If has sig IDH after DC of hydral/nitrate, will need to add midodrine  # AL Amyloidosis: Dr. Marin Olp is following and started combination of Velcade, Cytoxan and daratumumab on 10/18  # Anasarca: Secondary to worsening renal function/nephrotic syndrome from amyloidosis. Hepatic and cardiac involvement in amyloidosis likely are contributory. Continue HD/UF, as above  #  Urinary tract infection: Per [facility]  #  Anemia of chronic illness: Without overt blood loss, monitor hemoglobin.  No ESA because of myeloma.  Subjective:  HD yesterday 4L UF Edema improved somewhat today No c/o  Objective Vital signs in last 24 hours: Vitals:   08/29/21 1230 08/29/21 1634 08/29/21 1933 08/30/21 0329  BP: (!) 128/57 (!) 113/47 109/67 (!) 122/56  Pulse: 65 71 62 64  Resp: $Remo'15 19 15 16  'kkkrK$ Temp: 98.3 F (36.8 C) 98.1 F (36.7 C) 97.8 F (36.6 C) 98.1 F (36.7 C)  TempSrc: Oral Oral Oral Oral  SpO2: 90% 97% 99% 97%  Weight: 107.5 kg   107.1 kg  Height:       Weight change:   Intake/Output Summary (Last 24 hours) at 08/30/2021 0928 Last data filed at 08/30/2021 0819 Gross per 24 hour  Intake 1080 ml  Output 4024 ml  Net -2944 ml        Labs: Basic Metabolic Panel: Recent Labs  Lab 08/28/21 0321 08/29/21 0303 08/30/21 0328  NA 133* 134* 133*  K 3.5 4.3 3.9  CL 99 98 97*  CO2 $Re'26 25 26  'cnO$ GLUCOSE 78 161* 78  BUN 47* 60* 38*  CREATININE 3.78* 4.92* 4.13*  CALCIUM 8.1* 8.5* 8.1*    Liver Function Tests: Recent Labs  Lab 08/28/21 0321 08/29/21 0303 08/30/21 0328  AST $Re'24 29 24  'vnm$ ALT $R'22 25 24  'dk$ ALKPHOS 59 69 61  BILITOT 0.5 0.3 0.4  PROT  4.3* 4.9* 4.8*  ALBUMIN 1.9* 2.1* 2.1*    No results for input(s): LIPASE, AMYLASE in the last 168 hours. No results for input(s): AMMONIA in the last 168 hours. CBC: Recent Labs  Lab 08/25/21 0318 08/28/21 0321 08/29/21 0303 08/30/21 0328  WBC 8.6 6.1 6.2 8.8  NEUTROABS  --  2.6 3.8 5.0  HGB 10.0* 8.2* 9.5* 8.9*  HCT 30.1* 25.2* 28.9* 27.4*  MCV 87.2 88.7 88.1 88.1  PLT 228 263 312 306    Cardiac Enzymes: No results for input(s): CKTOTAL, CKMB, CKMBINDEX, TROPONINI in the last 168 hours. CBG: No results for input(s): GLUCAP in the last 168 hours.  Iron Studies: No results for input(s): IRON, TIBC, TRANSFERRIN, FERRITIN in the last 72 hours. Studies/Results: No results found.  Medications: Infusions:  sodium chloride 10 mL/hr at 08/24/21 0012   sodium chloride 10 mL/hr at 08/18/21 2046    Scheduled Medications:  acyclovir  200 mg Oral BID   atorvastatin  40 mg Oral Daily   Chlorhexidine Gluconate Cloth  6 each Topical Daily   Chlorhexidine Gluconate Cloth  6 each Topical Q0600   darbepoetin (ARANESP) injection - DIALYSIS  200 mcg Intravenous Q Tue-HD   ezetimibe  10 mg Oral QPC supper   heparin  5,000 Units Subcutaneous Q8H   ondansetron  8 mg Oral Q12H   pantoprazole  40 mg Oral BID   sodium chloride flush  3 mL Intravenous Q12H   tamsulosin  0.4 mg Oral Daily    have reviewed scheduled and prn medications.  Physical Exam: General: Pleasant male, NAD. Heart:RRR, s1s2 nl Lungs: Diminished in bases, o/w CTAB Abdomen:soft, Non-tender Extremities: Anasarca with edematous both upper and lower extremities, not much change from yesterday Dialysis Access: Right IJ TDC in place. Exit site clean  [employer] 08/30/2021,9:28 AM  LOS: 16 days

## 2021-08-30 NOTE — Progress Notes (Signed)
Spoke to pt via phone regarding pt's possible d/c on Friday. Pt aware of plans and agreeable. Pt reports to Navigator that family is planning to assist with transportation to/from HD at d/c. Pt aware he will need to start on Saturday. Spoke to [place], Quarry manager, at [place] to make her aware of pt's need to start on Saturday. Elmyra Ricks agreeable to plan. Will follow and assist.   Melven Sartorius Renal Navigator [phone]

## 2021-08-31 ENCOUNTER — Encounter (HOSPITAL_COMMUNITY): Payer: Self-pay | Admitting: Hematology & Oncology

## 2021-08-31 LAB — COMPREHENSIVE METABOLIC PANEL
ALT: 19 U/L (ref 0–44)
AST: 18 U/L (ref 15–41)
Albumin: 2 g/dL — ABNORMAL LOW (ref 3.5–5.0)
Alkaline Phosphatase: 66 U/L (ref 38–126)
Anion gap: 8 (ref 5–15)
BUN: 47 mg/dL — ABNORMAL HIGH (ref 8–23)
CO2: 25 mmol/L (ref 22–32)
Calcium: 7.9 mg/dL — ABNORMAL LOW (ref 8.9–10.3)
Chloride: 98 mmol/L (ref 98–111)
Creatinine, Ser: 5.05 mg/dL — ABNORMAL HIGH (ref 0.61–1.24)
GFR, Estimated: 11 mL/min — ABNORMAL LOW (ref >60)
Glucose, Bld: 76 mg/dL (ref 70–99)
Potassium: 4 mmol/L (ref 3.5–5.1)
Sodium: 131 mmol/L — ABNORMAL LOW (ref 135–145)
Total Bilirubin: 0.3 mg/dL (ref 0.3–1.2)
Total Protein: 4.5 g/dL — ABNORMAL LOW (ref 6.5–8.1)

## 2021-08-31 LAB — CBC WITH DIFFERENTIAL/PLATELET
Abs Immature Granulocytes: 0.03 10*3/uL (ref 0.00–0.07)
Basophils Absolute: 0 10*3/uL (ref 0.0–0.1)
Basophils Relative: 1 %
Eosinophils Absolute: 0.2 10*3/uL (ref 0.0–0.5)
Eosinophils Relative: 3 %
HCT: 27 % — ABNORMAL LOW (ref 39.0–52.0)
Hemoglobin: 8.6 g/dL — ABNORMAL LOW (ref 13.0–17.0)
Immature Granulocytes: 0 %
Lymphocytes Relative: 32 %
Lymphs Abs: 2.7 10*3/uL (ref 0.7–4.0)
MCH: 28.4 pg (ref 26.0–34.0)
MCHC: 31.9 g/dL (ref 30.0–36.0)
MCV: 89.1 fL (ref 80.0–100.0)
Monocytes Absolute: 1.1 10*3/uL — ABNORMAL HIGH (ref 0.1–1.0)
Monocytes Relative: 14 %
Neutro Abs: 4.2 10*3/uL (ref 1.7–7.7)
Neutrophils Relative %: 50 %
Platelets: 304 10*3/uL (ref 150–400)
RBC: 3.03 MIL/uL — ABNORMAL LOW (ref 4.22–5.81)
RDW: 15.5 % (ref 11.5–15.5)
WBC: 8.3 10*3/uL (ref 4.0–10.5)
nRBC: 0 % (ref 0.0–0.2)

## 2021-08-31 MED ORDER — HEPARIN SODIUM (PORCINE) 1000 UNIT/ML DIALYSIS
20.0000 [IU]/kg | INTRAMUSCULAR | Status: DC | PRN
  Filled 2021-08-31: qty 3

## 2021-08-31 MED ORDER — ALBUMIN HUMAN 25 % IV SOLN
INTRAVENOUS | Status: AC
  Administered 2021-08-31: 25 g
  Filled 2021-08-31: qty 100

## 2021-08-31 NOTE — Progress Notes (Signed)
[facility]                                                                              Patient Demographics  Corey Beard, is a 75 y.o. male, DOB - Oct 08, 1946, PJK:[id number]  Admit date - 08/14/2021   Admitting Physician Hosie Poisson, MD  Outpatient Primary MD for the patient is Benito Mccreedy, MD  Outpatient specialists:   LOS - 17  days   Medical records reviewed and are as summarized below:    No chief complaint on file.      Brief summary   Patient is a 75 year old African-American male, retired physician with hypertension, diabetes type 2, history of prostate CA, tobacco use, carotid stenosis, nonobstructive CAD, was admitted with progressive anasarca.  He was found to have bilateral pleural effusions R>L, had undergone 2 L thoracentesis on 08/07/2021.  Creatinine on admission 3.3 from baseline of 1.2-1.3 a year ago.  3+ proteinuria.  He was initially admitted by cardiology then transferred to [facility]. Nephrology was consulted for evaluation of nephrotic syndrome, underwent tunneled HD catheter placement on 10/10.  He underwent bone marrow biopsy and renal biopsy on 10/11 and was started on hemodialysis. Bone marrow biopsy was consistent with myeloma, kidney biopsy with amyloidosis.   Assessment & Plan    Principal Problem:   Nephrotic syndrome with anasarca, ascites, bilateral pleural effusions, proteinuria, acute kidney injury, hypoalbuminemia -Nephrotic syndrome secondary to amyloidosis, multiple myeloma. -2D echocardiogram showed normal EF -Renal ultrasound showed normal-appearing kidneys with exception of left lower pole renal cyst -Due to significant uremia and fluid overload, started on HD on 10/11 with TTS schedule -Received HD today, will have next HD at outpatient HD -Plan for DC home in a.m.   Active Problems: Amyloidosis, lambda light chain myeloma -Oncology following, received fourth cycle of treatment with Cytoxan/Velcade on Monday,  10/17.  Next cycle will be next week outpatient in the office.  Essential hypertension -BP stable, no issues  CAD -Seen during HD, no acute shortness of breath or chest pain.  BPH -Continue Flomax  Obesity Estimated body mass index is 32.4 kg/m as calculated from the following:   Height as of this encounter: 6' 1" (1.854 m).   Weight as of this encounter: 111.4 kg.  Code Status: Partial DVT Prophylaxis:  heparin injection 5,000 Units Start: 08/23/21 1000   Level of Care: Level of care: Telemetry Cardiac Family Communication: Discussed all imaging results, lab results, explained to the patient   Disposition Plan:     Status is: Inpatient  Remains inpatient appropriate because: DC home tomorrow a.m.   Time Spent in minutes 25 minutes  Procedures:  Renal biopsy, bone marrow biopsy 10/11 Right permacath 10/10.  Consultants:   Cardiology Nephrology Oncology  Antimicrobials:   Anti-infectives (From admission, onward)    Start     Dose/Rate Route Frequency Ordered Stop   08/28/21 1030  acyclovir (ZOVIRAX) tablet 200 mg        200 mg Oral 2 times daily 08/28/21 0935     08/21/21 1203  ceFAZolin (ANCEF) 2-4 GM/100ML-% IVPB  Status:  Discontinued       Note to  Pharmacy: Cecile Sheerer   : cabinet override      08/21/21 1203 08/21/21 1220   08/19/21 0830  cefTRIAXone (ROCEPHIN) 1 g in sodium chloride 0.9 % 100 mL IVPB        1 g 200 mL/hr over 30 Minutes Intravenous Every 24 hours 08/19/21 0739 08/21/21 0843          Medications  Scheduled Meds:  acyclovir  200 mg Oral BID   atorvastatin  40 mg Oral Daily   Chlorhexidine Gluconate Cloth  6 each Topical Daily   Chlorhexidine Gluconate Cloth  6 each Topical Q0600   darbepoetin (ARANESP) injection - DIALYSIS  200 mcg Intravenous Q Tue-HD   ezetimibe  10 mg Oral QPC supper   heparin  5,000 Units Subcutaneous Q8H   ondansetron  8 mg Oral Q12H   pantoprazole  40 mg Oral BID   sodium chloride flush  3 mL  Intravenous Q12H   tamsulosin  0.4 mg Oral Daily   Continuous Infusions:  sodium chloride 10 mL/hr at 08/24/21 0012   sodium chloride 10 mL/hr at 08/18/21 2046   PRN Meds:.sodium chloride, acetaminophen, diphenoxylate-atropine, Gerhardt's butt cream, sodium chloride flush      Subjective:   Corey Beard was seen and examined today.  Seen during HD, no acute complaints.  Plan for next HD on Saturday at the outpatient HD center.  No acute issues overnight.  Objective:   Vitals:   08/31/21 1030 08/31/21 1100 08/31/21 1123 08/31/21 1125  BP: (!) 112/52 116/64 112/60 128/62  Pulse: 65 69 73 73  Resp: 10 (!) _0 Temp:      TempSrc:      SpO2:      Weight:      Height:        Intake/Output Summary (Last 24 hours) at 08/31/2021 1508 Last data filed at 08/31/2021 1125 Gross per 24 hour  Intake 240 ml  Output 4000 ml  Net -3760 ml     Wt Readings from Last 3 Encounters:  08/31/21 111.4 kg  06/23/21 103.3 kg  05/12/21 104.7 kg   Physical Exam General: Alert and oriented x 3, NAD Cardiovascular: S1 S2 clear, RRR.  Respiratory: CTAB, no wheezing, rales or rhonchi Gastrointestinal: Soft, nontender, nondistended, NBS Ext: generalized anasarca improving    Data Reviewed:  I have personally reviewed following labs and imaging studies  Micro Results No results found for this or any previous visit (from the past 240 hour(s)).  Radiology Reports DG Chest 1 View  Result Date: 08/08/2021 CLINICAL DATA:  75 year old status post right-sided thoracentesis EXAM: CHEST  1 VIEW COMPARISON:  CT 08/02/2021 FINDINGS: Cardiomediastinal silhouette within normal limits in size and contour. Heart borders partially obscured by overlying lung/pleural disease. Calcifications of the aortic arch. Meniscus at the bilateral lung bases, larger on the left. No pneumothorax. No confluent airspace disease.  No interlobular septal thickening. No displaced fracture IMPRESSION: No complicating  features status post right-sided thoracentesis. Small bilateral pleural effusions persist. Electronically Signed   By: Corrie Mckusick D.O.   On: 08/08/2021 11:18   DG Chest 2 View  Result Date: 08/14/2021 CLINICAL DATA:  Dyspnea. EXAM: CHEST - 2 VIEW COMPARISON:  Chest x-ray 08/08/2021. FINDINGS: Heart is enlarged, unchanged. There are atherosclerotic calcifications of the aorta. There are small bilateral pleural effusions, increasing from prior study. There is no evidence for pneumothorax. There some strandy opacities at the lung bases favored is atelectasis. No acute fractures are seen. IMPRESSION: 1.  Increasing small bilateral pleural effusions. 2. Stable cardiomegaly. Electronically Signed   By: Ronney Asters M.D.   On: 08/14/2021 21:15   MR LIVER WO CONRTAST  Result Date: 08/18/2021 CLINICAL DATA:  Evaluate for cirrhosis. EXAM: MRI ABDOMEN WITHOUT CONTRAST TECHNIQUE: Multiplanar multisequence MR imaging was performed without the administration of intravenous contrast. COMPARISON:  08/16/2021 FINDINGS: Exam detail is markedly diminished due to a number of factors most importantly however is diffuse motion artifact. Lower chest: There are moderate bilateral pleural effusions with atelectasis within both lower lobes, the right middle lobe and lingula. Hepatobiliary: Imaging of the liver is significantly degraded by motion artifact as well as diffuse body wall edema and ascites. The study is not diagnostic for the evaluation of underlying liver lesion. Furthermore assessment of intrinsic liver abnormality cannot be adequately assessed. The gallbladder is visualized. No stones noted Pancreas:  Study not diagnostic for the evaluation of the pancreas. Spleen:  Appears normal in size. Adrenals/Urinary Tract: Suboptimally evaluated. A cyst is noted arising off the inferior pole of the left kidney measuring 2.6 cm. Stomach/Bowel: Diffuse gaseous distension of the colon is noted. Vascular/Lymphatic: Study not  diagnostic for the assessment of vascular structures or the presence or absence of adenopathy. Other: Abdominal ascites identified. Volume of ascites is difficult to quantify due to factors discussed above. Musculoskeletal: No acute abnormality noted. IMPRESSION: 1. Markedly diminished exam detail due to a number of factors most importantly however, there is diffuse motion artifact. This is most likely secondary to shortness of breath due to bilateral pleural effusions and overall fluid overload state. As mentioned on 08/16/2021, MRI should only be performed when the patient is clinically stable, able to follow directions, and hold their breath. If cross-sectional imaging of the liver is currently indicated on an emergent basis then liver protocol CT without and with contrast material may be more appropriate as this is less susceptible to respiratory motion artifact. 2. Moderate bilateral pleural effusions, ascites, and diffuse anasarca. 3. Diffuse gaseous distension of the colon. Electronically Signed   By: Kerby Moors M.D.   On: 08/18/2021 06:00   US RENAL  Result Date: 08/15/2021 CLINICAL DATA:  Nephrotic syndrome EXAM: RENAL / URINARY TRACT ULTRASOUND COMPLETE COMPARISON:  None. FINDINGS: Right Kidney: Renal measurements: 10.7 x 4.7 x 5.5 cm. = volume: 144 mL. Echogenicity within normal limits. No mass or hydronephrosis visualized. Left Kidney: Renal measurements: 11.2 x 6.8 x 5.2 cm. = volume: 208 mL. 2.7 cm lower pole cyst is noted in the left kidney. No mass lesion or hydronephrosis is noted. Bladder: Partially decompressed. The wall is thickened although likely related to the decompression. Other: Diffuse ascites is noted within the abdomen and pelvis. IMPRESSION: Diffuse ascites within the abdomen and pelvis. Normal-appearing kidneys with the exception of a left lower pole renal cyst. Electronically Signed   By: Corey Catalina M.D.   On: 08/15/2021 20:02   IR Fluoro Guide CV Line Right  Result  Date: 08/21/2021 INDICATION: End-stage renal disease. In need durable intravenous access for the initiation hemodialysis. EXAM: TUNNELED CENTRAL VENOUS HEMODIALYSIS CATHETER PLACEMENT WITH ULTRASOUND AND FLUOROSCOPIC GUIDANCE MEDICATIONS: The patient is currently admitted to the hospital receiving intravenous antibiotics. The antibiotic was given in an appropriate time interval prior to skin puncture. ANESTHESIA/SEDATION: Moderate (conscious) sedation was employed during this procedure. A total of Versed 0.5 mg and Fentanyl 25 mcg was administered intravenously. Moderate Sedation Time: 15 minutes. The patient's level of consciousness and vital signs were monitored continuously by radiology nursing throughout  the procedure under my direct supervision. FLUOROSCOPY TIME:  36 seconds (14 mGy) COMPLICATIONS: None immediate. PROCEDURE: Informed written consent was obtained from the patient after a discussion of the risks, benefits, and alternatives to treatment. Questions regarding the procedure were encouraged and answered. The right neck and chest were prepped with chlorhexidine in a sterile fashion, and a sterile drape was applied covering the operative field. Maximum barrier sterile technique with sterile gowns and gloves were used for the procedure. A timeout was performed prior to the initiation of the procedure. After creating a small venotomy incision, a micropuncture kit was utilized to access the internal jugular vein. Real-time ultrasound guidance was utilized for vascular access including the acquisition of a permanent ultrasound image documenting patency of the accessed vessel. The microwire was utilized to measure appropriate catheter length. A stiff Glidewire was advanced to the level of the IVC and the micropuncture sheath was exchanged for a peel-away sheath. A palindrome tunneled hemodialysis catheter measuring 19 cm from tip to cuff was tunneled in a retrograde fashion from the anterior chest wall to  the venotomy incision. The catheter was then placed through the peel-away sheath with tips ultimately positioned within the superior aspect of the right atrium. Final catheter positioning was confirmed and documented with a spot radiographic image. The catheter aspirates and flushes normally. The catheter was flushed with appropriate volume heparin dwells. The catheter exit site was secured with a 0-Prolene retention suture. The venotomy incision was closed with an interrupted 4-0 Vicryl, Dermabond and Steri-strips. Dressings were applied. The patient tolerated the procedure well without immediate post procedural complication. IMPRESSION: Successful placement of 19 cm tip to cuff tunneled hemodialysis catheter via the right internal jugular vein with tips terminating within the superior aspect of the right atrium. The catheter is ready for immediate use. Electronically Signed   By: Sandi Mariscal M.D.   On: 08/21/2021 14:57   IR US Guide Vasc Access Right  INDICATION: End-stage renal disease. In need durable intravenous access for the initiation hemodialysis.   EXAM: TUNNELED CENTRAL VENOUS HEMODIALYSIS CATHETER PLACEMENT WITH ULTRASOUND AND FLUOROSCOPIC GUIDANCE   MEDICATIONS: The patient is currently admitted to the hospital receiving intravenous antibiotics. The antibiotic was given in an appropriate time interval prior to skin puncture.   ANESTHESIA/SEDATION: Moderate (conscious) sedation was employed during this procedure. A total of Versed 0.5 mg and Fentanyl 25 mcg was administered intravenously.   Moderate Sedation Time: 15 minutes. The patient's level of consciousness and vital signs were monitored continuously by radiology nursing throughout the procedure under my direct supervision.   FLUOROSCOPY TIME:  36 seconds (14 mGy)   COMPLICATIONS: None immediate.   PROCEDURE: Informed written consent was obtained from the patient after a discussion of the risks, benefits, and alternatives to treatment. Questions  regarding the procedure were encouraged and answered. The right neck and chest were prepped with chlorhexidine in a sterile fashion, and a sterile drape was applied covering the operative field. Maximum barrier sterile technique with sterile gowns and gloves were used for the procedure. A timeout was performed prior to the initiation of the procedure.   After creating a small venotomy incision, a micropuncture kit was utilized to access the internal jugular vein. Real-time ultrasound guidance was utilized for vascular access including the acquisition of a permanent ultrasound image documenting patency of the accessed vessel. The microwire was utilized to measure appropriate catheter length.   A stiff Glidewire was advanced to the level of the IVC and the micropuncture sheath  was exchanged for a peel-away sheath. A palindrome tunneled hemodialysis catheter measuring 19 cm from tip to cuff was tunneled in a retrograde fashion from the anterior chest wall to the venotomy incision.   The catheter was then placed through the peel-away sheath with tips ultimately positioned within the superior aspect of the right atrium. Final catheter positioning was confirmed and documented with a spot radiographic image. The catheter aspirates and flushes normally. The catheter was flushed with appropriate volume heparin dwells.   The catheter exit site was secured with a 0-Prolene retention suture. The venotomy incision was closed with an interrupted 4-0 Vicryl, Dermabond and Steri-strips. Dressings were applied. The patient tolerated the procedure well without immediate post procedural complication.   IMPRESSION: Successful placement of 19 cm tip to cuff tunneled hemodialysis catheter via the right internal jugular vein with tips terminating within the superior aspect of the right atrium. The catheter is ready for immediate use.     Electronically Signed   By: Sandi Mariscal M.D.   On: 08/21/2021 14:57  DG Bone Survey Met  Result  Date: 08/19/2021 CLINICAL DATA:  Metastatic bone survey. EXAM: METASTATIC BONE SURVEY COMPARISON:  MRI of the abdomen August 17, 2021 FINDINGS: Skull: Normal bony mineralization. No focal lytic or blastic osseous lesion. Cervical Spine: Normal bony mineralization. No focal lytic or blastic osseous lesion. Thoracic Spine: Normal bony mineralization. No focal lytic or blastic osseous lesion. Chest: Normal bony mineralization. No focal lytic or blastic osseous lesion. Lumbar Spine: Normal bony mineralization. No focal lytic or blastic osseous lesion. Pelvis: Normal bony mineralization. No focal lytic or blastic osseous lesion. Right Upper Extremity: Normal bony mineralization. No focal lytic or blastic osseous lesion. Left Upper Extremity: Normal bony mineralization. No focal lytic or blastic osseous lesion. Right Lower Extremity: Normal bony mineralization. No focal lytic or blastic osseous lesion. Left Lower Extremity: Normal bony mineralization. No focal lytic or blastic osseous lesion. Additional Bilateral pleural effusions. Osteoarthritic changes of the bilateral shoulders. Calcific atherosclerotic disease of the carotid arteries, aorta and femoral arteries. IMPRESSION: No lytic or sclerotic osseous lesions seen throughout the skeletal survey. Electronically Signed   By: Fidela Salisbury M.D.   On: 08/19/2021 14:07   CT BONE MARROW BIOPSY & ASPIRATION  Result Date: 08/22/2021 INDICATION: Monoclonal gammopathy of unknown significance, amyloidosis EXAM: CT GUIDED LEFT ILIAC BONE MARROW ASPIRATION AND CORE BIOPSY Date:  08/22/2021 08/22/2021 10:32 am Radiologist:  M. Daryll Brod, MD Guidance:  CT FLUOROSCOPY TIME:  Fluoroscopy Time: None. MEDICATIONS: 1% lidocaine local ANESTHESIA/SEDATION: 1.0 mg IV Versed; 80 mcg IV Fentanyl Moderate Sedation Time:  27 minute The patient was continuously monitored during the procedure by the interventional radiology nurse under my direct supervision. CONTRAST:  None.  COMPLICATIONS: None PROCEDURE: Informed consent was obtained from the patient following explanation of the procedure, risks, benefits and alternatives. The patient understands, agrees and consents for the procedure. All questions were addressed. A time out was performed. The patient was positioned prone and non-contrast localization CT was performed of the pelvis to demonstrate the iliac marrow spaces. Maximal barrier sterile technique utilized including caps, mask, sterile gowns, sterile gloves, large sterile drape, hand hygiene, and Betadine prep. Under sterile conditions and local anesthesia, an 11 gauge coaxial bone biopsy needle was advanced into the left iliac marrow space. Needle position was confirmed with CT imaging. Initially, bone marrow aspiration was performed. Next, the 11 gauge outer cannula was utilized to obtain a left iliac bone marrow core biopsy. Needle was removed. Hemostasis  was obtained with compression. The patient tolerated the procedure well. Samples were prepared with the cytotechnologist. No immediate complications. IMPRESSION: CT guided left iliac bone marrow aspiration and core biopsy. Electronically Signed   By: Jerilynn Mages.  Shick M.D.   On: 08/22/2021 10:35   VAS US RENAL ARTERY DUPLEX  Result Date: 08/16/2021 ABDOMINAL VISCERAL Patient Name:  Corey Beard [facility]  Date of Exam:   08/16/2021 Medical Rec #: [id number]         Accession #:    [id number] Date of Birth: [birth year]          Patient Gender: M Patient Age:   40 years Exam Location:  [facility] Procedure:      VAS US RENAL ARTERY DUPLEX Referring Phys: [id number] LINDSAY A KRUSKA -------------------------------------------------------------------------------- Indications: AKI High Risk Factors: Hypertension, Diabetes. Limitations: Limited study. MD only wanted the renal veins to assess patency. Comparison Study: No prior studies. Performing Technologist: Oliver Hum RVT  Examination Guidelines: A complete evaluation includes  B-mode imaging, spectral Doppler, color Doppler, and power Doppler as needed of all accessible portions of each vessel. Bilateral testing is considered an integral part of a complete examination. Limited examinations for reoccurring indications may be performed as noted.  Duplex Findings:  Technologist observations: The right and left renal veins appear patent.  Summary:  *See table(s) above for measurements and observations.  Diagnosing physician: Harold Barban MD  Electronically signed by Harold Barban MD on 08/16/2021 at 4:50:07 PM.    Final    CT RENAL BIOPSY  Result Date: 08/22/2021 INDICATION: Renal failure, nephrotic syndrome, concern for amyloidosis EXAM: ULTRASOUND LEFT RENAL RANDOM CORE BIOPSY MEDICATIONS: 1% LIDOCAINE LOCAL ANESTHESIA/SEDATION: Moderate (conscious) sedation was employed during this procedure. A total of Versed 1 mg and Fentanyl 80 mcg was administered intravenously. Moderate Sedation Time: 27 minutes. The patient's level of consciousness and vital signs were monitored continuously by radiology nursing throughout the procedure under my direct supervision. FLUOROSCOPY TIME:  Fluoroscopy Time: NONE. COMPLICATIONS: None immediate. PROCEDURE: Informed written consent was obtained from the patient after a thorough discussion of the procedural risks, benefits and alternatives. All questions were addressed. Maximal Sterile Barrier Technique was utilized including caps, mask, sterile gowns, sterile gloves, sterile drape, hand hygiene and skin antiseptic. A timeout was performed prior to the initiation of the procedure. Patient position prone. Previous imaging reviewed. Left kidney lower pole was localized and marked. Under sterile conditions and local anesthesia, a 15 gauge coaxial guide needle was advanced to the left kidney lower pole cortex. Needle position confirmed with ultrasound. Images obtained for documentation. 2 16 gauge core biopsies obtained through the access under direct  ultrasound of the lower pole cortex. Samples were intact and non fragmented. These were placed in saline. Needle tract occluded with Gel-Foam. Postprocedure imaging demonstrates no hemorrhage or hematoma. Patient tolerated biopsy well. IMPRESSION: Successful ultrasound left kidney core biopsy Electronically Signed   By: Jerilynn Mages.  Shick M.D.   On: 08/22/2021 10:38   US Abdomen Limited RUQ (LIVER/GB)  Result Date: 08/16/2021 CLINICAL DATA:  Cirrhosis/ascites EXAM: ULTRASOUND ABDOMEN LIMITED RIGHT UPPER QUADRANT COMPARISON:  None. FINDINGS: Gallbladder: No gallstones or wall thickening visualized. No sonographic Murphy sign noted by sonographer. Common bile duct: Diameter: 1.7 mL Liver: Nodular hepatic contour. No focal lesion identified. Decreased parenchymal echogenicity. Portal vein is patent on color Doppler imaging with normal direction of blood flow towards the liver. Other: At least small volume free fluid. At least trace volume right pleural effusion. IMPRESSION: 1. Cirrhosis. Recommend MRI  liver protocol for further evaluation of the hepatic parenchyma. When the patient is clinically stable and able to follow directions and hold their breath (preferably as an outpatient) further evaluation with dedicated abdominal MRI should be considered. 2. At least small volume ascites. 3. At least trace volume right pleural effusion. Electronically Signed   By: Iven Finn M.D.   On: 08/16/2021 18:55   IR Paracentesis  Result Date: 08/18/2021 INDICATION: Patient with history of AKI, newly diagnosed cirrhosis, and anasarca. Request for diagnostic only paracentesis with a maximum drain of 100 mL. EXAM: ULTRASOUND GUIDED DIAGNOSTIC PARACENTESIS MEDICATIONS: 10 mL 1% lidocaine COMPLICATIONS: None immediate. PROCEDURE: Informed written consent was obtained from the patient after a discussion of the risks, benefits and alternatives to treatment. A timeout was performed prior to the initiation of the procedure. Initial  ultrasound scanning demonstrates a moderate amount of ascites within the right upper abdominal quadrant. The right upper abdomen was prepped and draped in the usual sterile fashion. 1% lidocaine was used for local anesthesia. Following this, a 6 Fr Safe-T-Centesis catheter was introduced. An ultrasound image was saved for documentation purposes. The paracentesis was performed. The catheter was removed and a dressing was applied. The patient tolerated the procedure well without immediate post procedural complication. FINDINGS: A total of approximately 100 mL of hazy, yellow fluid was removed. Samples were sent to the laboratory as requested by the clinical team. Per order maximum of 100 mL was removed from right upper quadrant even though there was a moderate amount of ascites noted on ultrasound images remaining. IMPRESSION: Successful ultrasound-guided paracentesis yielding 100 mL of peritoneal fluid. Read by: Narda Rutherford, NP Electronically Signed   By: Miachel Roux M.D.   On: 08/18/2021 11:11   IR THORACENTESIS ASP PLEURAL SPACE W/IMG GUIDE  Result Date: 08/08/2021 INDICATION: Pt with hx of CAD, HLD, HTN, prostate cancer and edema with recent anasarca and fluid overload with pleural effusions. Request for diagnostic and therapeutic thoracentesis. EXAM: ULTRASOUND GUIDED DIAGNOSTIC AND THERAPEUTIC THORACENTESIS MEDICATIONS: 41m 1% lidocaine COMPLICATIONS: None immediate. PROCEDURE: An ultrasound guided thoracentesis was thoroughly discussed with the patient and questions answered. The benefits, risks, alternatives and complications were also discussed. The patient understands and wishes to proceed with the procedure. Written consent was obtained. Ultrasound was performed to localize and mark an adequate pocket of fluid in the right chest. The area was then prepped and draped in the normal sterile fashion. 1% Lidocaine was used for local anesthesia. Under ultrasound guidance a 6 Fr Safe-T-Centesis catheter was  introduced. Thoracentesis was performed. The catheter was removed and a dressing applied. FINDINGS: A total of approximately 2 L of clear, yellow fluid was removed. Samples were sent to the laboratory as requested by the clinical team. IMPRESSION: Successful ultrasound guided right thoracentesis yielding 2 L of pleural fluid. Electronically Signed   By: JCorrie MckusickD.O.   On: 08/08/2021 13:05    Lab Data:  CBC: Recent Labs  Lab 08/25/21 0318 08/28/21 0321 08/29/21 0303 08/30/21 0328 08/31/21 0203  WBC 8.6 6.1 6.2 8.8 8.3  NEUTROABS  --  2.6 3.8 5.0 4.2  HGB 10.0* 8.2* 9.5* 8.9* 8.6*  HCT 30.1* 25.2* 28.9* 27.4* 27.0*  MCV 87.2 88.7 88.1 88.1 89.1  PLT 228 263 312 306 3330  Basic Metabolic Panel: Recent Labs  Lab 08/25/21 0318 08/28/21 0321 08/29/21 0303 08/30/21 0328 08/31/21 0203  NA 136 133* 134* 133* 131*  K 4.6 3.5 4.3 3.9 4.0  CL 103 99 98 97*  98  CO2 _0 GLUCOSE 80 78 161* 78 76  BUN 54* 47* 60* 38* 47*  CREATININE 3.28* 3.78* 4.92* 4.13* 5.05*  CALCIUM 8.0* 8.1* 8.5* 8.1* 7.9*  MG 2.2  --   --   --   --    GFR: Estimated Creatinine Clearance: 17 mL/min (A) (by C-G formula based on SCr of 5.05 mg/dL (H)). Liver Function Tests: Recent Labs  Lab 08/28/21 0321 08/29/21 0303 08/30/21 0328 08/31/21 0203  AST _1 ALT _2 ALKPHOS 59 69 61 66  BILITOT 0.5 0.3 0.4 0.3  PROT 4.3* 4.9* 4.8* 4.5*  ALBUMIN 1.9* 2.1* 2.1* 2.0*   No results for input(s): LIPASE, AMYLASE in the last 168 hours. No results for input(s): AMMONIA in the last 168 hours. Coagulation Profile: No results for input(s): INR, PROTIME in the last 168 hours. Cardiac Enzymes: No results for input(s): CKTOTAL, CKMB, CKMBINDEX, TROPONINI in the last 168 hours. BNP (last 3 results) No results for input(s): PROBNP in the last 8760 hours. HbA1C: No results for input(s): HGBA1C in the last 72 hours. CBG: No results for input(s): GLUCAP in the last 168 hours. Lipid  Profile: No results for input(s): CHOL, HDL, LDLCALC, TRIG, CHOLHDL, LDLDIRECT in the last 72 hours. Thyroid Function Tests: No results for input(s): TSH, T4TOTAL, FREET4, T3FREE, THYROIDAB in the last 72 hours. Anemia Panel: No results for input(s): VITAMINB12, FOLATE, FERRITIN, TIBC, IRON, RETICCTPCT in the last 72 hours. Urine analysis:    Component Value Date/Time   COLORURINE YELLOW 08/15/2021 0756   APPEARANCEUR CLOUDY (A) 08/15/2021 0756   LABSPEC 1.013 08/15/2021 0756   PHURINE 5.0 08/15/2021 0756   GLUCOSEU NEGATIVE 08/15/2021 0756   HGBUR SMALL (A) 08/15/2021 0756   BILIRUBINUR NEGATIVE 08/15/2021 0756   KETONESUR NEGATIVE 08/15/2021 0756   PROTEINUR >=300 (A) 08/15/2021 0756   NITRITE NEGATIVE 08/15/2021 0756   LEUKOCYTESUR LARGE (A) 08/15/2021 0756     Jasleen Riepe M.D. [facility] 08/31/2021, 3:08 PM  Available via Epic secure chat 7am-7pm After 7 pm, please refer to night coverage provider listed on amion.

## 2021-08-31 NOTE — Progress Notes (Signed)
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  He has tunneled HD catheter for the access.  Too much edema for permanent access at this time.  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 Hepatic and cardiac involvement in amyloidosis likely are contributory. Continue HD/UF, as above  #  Urinary tract infection: Per TR[facility]  #  Anemia of chronic illness: Without overt blood loss, monitor hemoglobin.  No ESA because of myeloma.  Subjective:  Seen on HD no c/o.  Qb 400, 3K, goal UF 4L  Objective Vital signs in last 24 hours: Vitals:   08/31/21 0600 08/31/21 0706 08/31/21 0722 08/31/21 0800  BP:  93/72 (!) 117/56 124/61  Pulse:  (!) 57 71 71  Resp:  _0 Temp:  98.4 F (36.9 C)    TempSrc:  Temporal    SpO2:  95%    Weight: 108.7 kg 111.4 kg    Height:       Weight change: -2.8 kg  Intake/Output Summary (Last 24 hours) at 08/31/2021 0845 Last data filed at 08/30/2021 1800 Gross per 24 hour  Intake 480 ml  Output --  Net 480 ml        Labs: Basic Metabolic Panel: Recent Labs  Lab 08/29/21 0303 08/30/21 0328 08/31/21 0203  NA 134* 133* 131*  K 4.3 3.9 4.0  CL 98 97* 98  CO2 _1 GLUCOSE 161* 78 76  BUN 60* 38* 47*  CREATININE 4.92* 4.13* 5.05*  CALCIUM 8.5* 8.1* 7.9*    Liver Function Tests: Recent Labs  Lab 08/29/21 0303 08/30/21 0328 08/31/21 0203  AST _2 ALT _3 ALKPHOS 69 61 66  BILITOT 0.3 0.4 0.3  PROT 4.9* 4.8* 4.5*  ALBUMIN 2.1* 2.1* 2.0*     No results for input(s): LIPASE, AMYLASE in the last 168 hours. No results for input(s): AMMONIA in the last 168 hours. CBC: Recent Labs  Lab 08/25/21 0318 08/25/21 0318 08/28/21 0321 08/29/21 0303 08/30/21 0328 08/31/21 0203  WBC 8.6  --  6.1 6.2 8.8 8.3  NEUTROABS  --    < > 2.6 3.8 5.0 4.2  HGB 10.0*  --  8.2* 9.5* 8.9* 8.6*  HCT 30.1*  --  25.2* 28.9* 27.4* 27.0*  MCV 87.2  --  88.7 88.1 88.1 89.1  PLT 228  --  263 312 306 304   < > = values in this interval not displayed.    Cardiac Enzymes: No results for input(s): CKTOTAL, CKMB, CKMBINDEX, TROPONINI in the last 168 hours. CBG: No results for input(s): GLUCAP in the last 168 hours.  Iron Studies: No results for input(s): IRON, TIBC, TRANSFERRIN, FERRITIN in the last 72 hours. Studies/Results: No results found.  Medications: Infusions:  sodium chloride 10 mL/hr at 08/24/21 0012   sodium chloride 10 mL/hr at 08/18/21 2046    Scheduled Medications:  acyclovir  200 mg Oral BID   atorvastatin  40 mg Oral Daily   Chlorhexidine Gluconate Cloth  6 each Topical Daily  Chlorhexidine Gluconate Cloth  6 each Topical Q0600   darbepoetin (ARANESP) injection - DIALYSIS  200 mcg Intravenous Q Tue-HD   ezetimibe  10 mg Oral QPC supper   heparin  5,000 Units Subcutaneous Q8H   ondansetron  8 mg Oral Q12H   pantoprazole  40 mg Oral BID   sodium chloride flush  3 mL Intravenous Q12H   tamsulosin  0.4 mg Oral Daily    have reviewed scheduled and prn medications.  Physical Exam: General: Pleasant male, NAD. Heart:RRR, s1s2 nl Lungs: Diminished in bases, o/w CTAB Abdomen:soft, Non-tender Extremities: Anasarca with edematous both upper and lower extremities Dialysis Access: Right IJ TDC in place. Exit site clean  Hollynn Garno B Belmira Daley 08/31/2021,8:45 AM  LOS: 17 days

## 2021-08-31 NOTE — TOC Progression Note (Signed)
Transition of Care [facility]) - Progression Note    Patient Details  Name: Corey Beard MRN: [id number] Date of Birth: 01-20-1946  [facility] CM/SW Contact  Zenon Mayo, RN Phone Number: 08/31/2021, 4:55 PM  Clinical Narrative:    NCM spoke with daughter, she has confirmed the hospital bed has been delivered to the home. Informed her patient will be for dc tomorrow.  She states she will come to transport him home at that time.   Expected Discharge Plan: [place] Barriers to Discharge: Continued Medical Work up  Expected Discharge Plan and Services Expected Discharge Plan: [place] In-house Referral: NA Discharge Planning Services: CM Consult Post Acute Care Choice: [place] arrangements for the past 2 months: Single Family Home                 DME Arranged:  (following for DME needs- daughter asked about a hospital bed.)         HH Arranged: OT, PT HH Agency: [place] Date [facility] Agency Contacted: 08/22/21 Time [place]: 1250 Representative spoke with at [place]: [place] ([place]) Interventions    Readmission Risk Interventions No flowsheet data found.

## 2021-09-01 ENCOUNTER — Other Ambulatory Visit: Payer: Self-pay | Admitting: Hematology & Oncology

## 2021-09-01 ENCOUNTER — Encounter (HOSPITAL_COMMUNITY): Payer: Self-pay | Admitting: Hematology & Oncology

## 2021-09-01 ENCOUNTER — Inpatient Hospital Stay (HOSPITAL_COMMUNITY): Payer: Medicare PPO

## 2021-09-01 DIAGNOSIS — R609 Edema, unspecified: Secondary | ICD-10-CM

## 2021-09-01 DIAGNOSIS — N049 Nephrotic syndrome with unspecified morphologic changes: Secondary | ICD-10-CM | POA: Diagnosis not present

## 2021-09-01 MED ORDER — ACYCLOVIR 400 MG PO TABS: 200.0000 mg | ORAL_TABLET | Freq: Two times a day (BID) | ORAL | 2 refills | Status: AC

## 2021-09-01 MED ORDER — ONDANSETRON HCL 8 MG PO TABS: 8.0000 mg | ORAL_TABLET | Freq: Three times a day (TID) | ORAL | 0 refills | Status: AC | PRN

## 2021-09-01 MED ORDER — PANTOPRAZOLE SODIUM 40 MG PO TBEC: 40.0000 mg | DELAYED_RELEASE_TABLET | Freq: Two times a day (BID) | ORAL | 3 refills | Status: AC

## 2021-09-01 NOTE — Discharge Summary (Signed)
Physician Discharge Summary   Patient ID: Corey Beard MRN: [id number] DOB/AGE: 06/11/1946 75 y.o.  Admit date: 08/14/2021 Discharge date: 09/01/2021  Primary Care Physician:  Benito Mccreedy, MD   Recommendations for Outpatient Follow-up:  Follow up with PCP in 1-2 weeks Last HD done on 08/31/2021, next HD arranged at [facility] on TTS schedule, need to arrive at 10:45 AM in first treatment.   Home Health:  Equipment/Devices:   Discharge Condition: stable  CODE STATUS: Partial Diet recommendation: Heart healthy diet   Discharge Diagnoses:      Nephrotic syndrome with anasarca,   ascites,   bilateral pleural effusions,  proteinuria, with acute kidney injury,  Amyloidosis, lambda light chain myeloma  Obesity  Atherosclerotic heart disease of native coronary artery without angina pectoris  Essential hypertension  Dyslipidemia   Consults:   Nephrology Oncology Cardiology   Allergies:   Allergies  Allergen Reactions   Amlodipine Swelling     DISCHARGE MEDICATIONS: Allergies as of 09/01/2021       Reactions   Amlodipine Swelling        Medication List     STOP taking these medications    aspirin EC 325 MG tablet   labetalol 100 MG tablet Commonly known as: NORMODYNE   olmesartan 40 MG tablet Commonly known as: Benicar   potassium chloride 10 MEQ tablet Commonly known as: KLOR-CON   torsemide 20 MG tablet Commonly known as: DEMADEX       TAKE these medications    acyclovir 400 MG tablet Commonly known as: ZOVIRAX Take 0.5 tablets (200 mg total) by mouth 2 (two) times daily.   atorvastatin 40 MG tablet Commonly known as: LIPITOR Take 1 tablet (40 mg total) by mouth daily.   ezetimibe 10 MG tablet Commonly known as: ZETIA Take 1 tablet (10 mg total) by mouth daily after supper.   L-ARGININE-500 PO Take 1,000 mg by mouth daily.   MULTIVITAMIN PO Take 1 tablet by mouth daily.   ondansetron 8 MG tablet Commonly known  as: ZOFRAN Take 1 tablet (8 mg total) by mouth every 8 (eight) hours as needed for nausea or vomiting.   pantoprazole 40 MG tablet Commonly known as: PROTONIX Take 1 tablet (40 mg total) by mouth 2 (two) times daily before a meal.   tamsulosin 0.4 MG Caps capsule Commonly known as: FLOMAX Take 0.4 mg by mouth daily.               Durable Medical Equipment  (From admission, onward)           Start     Ordered   08/25/21 1550  For home use only DME Hospital bed  Once       Question Answer Comment  Length of Need Lifetime   Patient has (list medical condition): pl effusion   The above medical condition requires: Patient requires the ability to reposition frequently   Head must be elevated greater than: 30 degrees   Bed type Semi-electric   Support Surface: Gel Overlay      08/25/21 1549              Discharge Care Instructions  (From admission, onward)           Start     Ordered   09/01/21 0000  If the dressing is still on your incision site when you go home, remove it on the third day after your surgery date. Remove dressing if it begins to fall off, or if  it is dirty or damaged before the third day.        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  He was found to have bilateral pleural effusions R>L, had undergone 2 L thoracentesis on 08/07/2021.  Creatinine on admission 3.3 from baseline of 1.2-1.3 a year ago.  3+ proteinuria.  He was initially admitted by cardiology then transferred to [facility]. Nephrology was consulted for evaluation of nephrotic syndrome, underwent tunneled HD catheter placement on 10/10.  He underwent bone marrow biopsy and renal biopsy on 10/11 and was  started on hemodialysis. Bone marrow biopsy was consistent with myeloma, kidney biopsy with amyloidosis.    Hospital Course:  Nephrotic syndrome with anasarca, ascites, bilateral pleural effusions, proteinuria, acute kidney injury, hypoalbuminemia -Nephrotic syndrome secondary to amyloidosis, multiple myeloma. -2D echo in 06/2021 had shown normal LV systolic function, LVEF 47%, grade 1 DD -Renal ultrasound showed normal-appearing kidneys with exception of left lower pole renal cyst -Due to significant uremia and fluid overload, started on HD on 10/11 with TTS schedule -HD last done on 08/31/2021, next arranged at outpatient HD center on 09/02/2021 -Doppler ultrasound of the right upper extremity negative for any DVT or SVT.  No evidence of thrombosis in left subclavian .  Cleared by nephrology for discharge.      Amyloidosis, lambda light chain myeloma -Oncology following, received fourth cycle of treatment with Cytoxan/Velcade on Monday, 10/17.  Next cycle will be next week outpatient in the office.   Essential hypertension -BP stable, no issues   CAD -currently no acute issues, no chest pain or shortness of breath   BPH -Continue Flomax   Obesity Estimated body mass index is 32.4 kg/m as calculated from the following:   Height as of this encounter: $RemoveBeforeD'6\' 1"'MrwHyzLthNYHgp$  (1.854 m).   Weight as of this encounter: 111.4 kg.  Day of Discharge S: Complaint of right arm swelling, venous Doppler negative for DVT or SVT   BP (!) 142/59 (BP Location: Left Arm)   Pulse 66   Temp 98.8 F (37.1 C) (Oral)   Resp 16   Ht $R'6\' 1"'ti$  (1.854 m)   Wt 105.8 kg   SpO2 95%   BMI 30.77 kg/m   Physical Exam: General: Alert and awake oriented x3 not in any acute distress. CVS: S1-S2 clear no murmur rubs or gallops Chest: clear to auscultation bilaterally, no wheezing rales or rhonchi Abdomen: soft nontender, nondistended, normal bowel sounds, small skin tear on right lower quadrant, no  cellulitis Extremities: no cyanosis, clubbing, right arm edema noted Neuro: no new deficits    Get Medicines reviewed and adjusted: Please take all your medications with you for your next visit with your Primary MD  Please request your Primary MD to go over all hospital tests and procedure/radiological results at the follow up. Please ask your Primary MD to get all Hospital records sent to his/her office.  If you experience worsening of your admission symptoms, develop shortness of breath, life threatening emergency, suicidal or homicidal thoughts you must seek medical attention immediately by calling 911 or calling your MD immediately  if symptoms less severe.  You must read complete instructions/literature along with all the possible adverse reactions/side effects  for all the Medicines you take and that have been prescribed to you. Take any new Medicines after you have completely understood and accept all the possible adverse reactions/side effects.   Do not drive when taking pain medications.   Do not take more than prescribed Pain, Sleep and Anxiety Medications  Special Instructions: If you have smoked or chewed Tobacco  in the last 2 yrs please stop smoking, stop any regular Alcohol  and or any Recreational drug use.  Wear Seat belts while driving.  Please note  You were cared for by a hospitalist during your hospital stay. Once you are discharged, your primary care physician will handle any further medical issues. Please note that NO REFILLS for any discharge medications will be authorized once you are discharged, as it is imperative that you return to your primary care physician (or establish a relationship with a primary care physician if you do not have one) for your aftercare needs so that they can reassess your need for medications and monitor your lab values.   The results of significant diagnostics from this hospitalization (including imaging, microbiology, ancillary and  laboratory) are listed below for reference.      Procedures/Studies:  DG Chest 1 View  Result Date: 08/08/2021 CLINICAL DATA:  75 year old status post right-sided thoracentesis EXAM: CHEST  1 VIEW COMPARISON:  CT 08/02/2021 FINDINGS: Cardiomediastinal silhouette within normal limits in size and contour. Heart borders partially obscured by overlying lung/pleural disease. Calcifications of the aortic arch. Meniscus at the bilateral lung bases, larger on the left. No pneumothorax. No confluent airspace disease.  No interlobular septal thickening. No displaced fracture IMPRESSION: No complicating features status post right-sided thoracentesis. Small bilateral pleural effusions persist. Electronically Signed   By: Corrie Mckusick D.O.   On: 08/08/2021 11:18   DG Chest 2 View  Result Date: 08/14/2021 CLINICAL DATA:  Dyspnea. EXAM: CHEST - 2 VIEW COMPARISON:  Chest x-ray 08/08/2021. FINDINGS: Heart is enlarged, unchanged. There are atherosclerotic calcifications of the aorta. There are small bilateral pleural effusions, increasing from prior study. There is no evidence for pneumothorax. There some strandy opacities at the lung bases favored is atelectasis. No acute fractures are seen. IMPRESSION: 1. Increasing small bilateral pleural effusions. 2. Stable cardiomegaly. Electronically Signed   By: Ronney Asters M.D.   On: 08/14/2021 21:15   MR LIVER WO CONRTAST  Result Date: 08/18/2021 CLINICAL DATA:  Evaluate for cirrhosis. EXAM: MRI ABDOMEN WITHOUT CONTRAST TECHNIQUE: Multiplanar multisequence MR imaging was performed without the administration of intravenous contrast. COMPARISON:  08/16/2021 FINDINGS: Exam detail is markedly diminished due to a number of factors most importantly however is diffuse motion artifact. Lower chest: There are moderate bilateral pleural effusions with atelectasis within both lower lobes, the right middle lobe and lingula. Hepatobiliary: Imaging of the liver is significantly  degraded by motion artifact as well as diffuse body wall edema and ascites. The study is not diagnostic for the evaluation of underlying liver lesion. Furthermore assessment of intrinsic liver abnormality cannot be adequately assessed. The gallbladder is visualized. No stones noted Pancreas:  Study not diagnostic for the evaluation of the pancreas. Spleen:  Appears normal in size. Adrenals/Urinary Tract: Suboptimally evaluated. A cyst is noted arising off the inferior pole of the left kidney measuring 2.6 cm. Stomach/Bowel: Diffuse gaseous distension of the colon is noted. Vascular/Lymphatic: Study not diagnostic for the assessment of vascular structures or the presence or absence of adenopathy. Other: Abdominal ascites identified. Volume of ascites is difficult to quantify due to  factors discussed above. Musculoskeletal: No acute abnormality noted. IMPRESSION: 1. Markedly diminished exam detail due to a number of factors most importantly however, there is diffuse motion artifact. This is most likely secondary to shortness of breath due to bilateral pleural effusions and overall fluid overload state. As mentioned on 08/16/2021, MRI should only be performed when the patient is clinically stable, able to follow directions, and hold their breath. If cross-sectional imaging of the liver is currently indicated on an emergent basis then liver protocol CT without and with contrast material may be more appropriate as this is less susceptible to respiratory motion artifact. 2. Moderate bilateral pleural effusions, ascites, and diffuse anasarca. 3. Diffuse gaseous distension of the colon. Electronically Signed   By: Kerby Moors M.D.   On: 08/18/2021 06:00   US RENAL  Result Date: 08/15/2021 CLINICAL DATA:  Nephrotic syndrome EXAM: RENAL / URINARY TRACT ULTRASOUND COMPLETE COMPARISON:  None. FINDINGS: Right Kidney: Renal measurements: 10.7 x 4.7 x 5.5 cm. = volume: 144 mL. Echogenicity within normal limits. No mass or  hydronephrosis visualized. Left Kidney: Renal measurements: 11.2 x 6.8 x 5.2 cm. = volume: 208 mL. 2.7 cm lower pole cyst is noted in the left kidney. No mass lesion or hydronephrosis is noted. Bladder: Partially decompressed. The wall is thickened although likely related to the decompression. Other: Diffuse ascites is noted within the abdomen and pelvis. IMPRESSION: Diffuse ascites within the abdomen and pelvis. Normal-appearing kidneys with the exception of a left lower pole renal cyst. Electronically Signed   By: Inez Catalina M.D.   On: 08/15/2021 20:02   IR Fluoro Guide CV Line Right  Result Date: 08/21/2021 INDICATION: End-stage renal disease. In need durable intravenous access for the initiation hemodialysis. EXAM: TUNNELED CENTRAL VENOUS HEMODIALYSIS CATHETER PLACEMENT WITH ULTRASOUND AND FLUOROSCOPIC GUIDANCE MEDICATIONS: The patient is currently admitted to the hospital receiving intravenous antibiotics. The antibiotic was given in an appropriate time interval prior to skin puncture. ANESTHESIA/SEDATION: Moderate (conscious) sedation was employed during this procedure. A total of Versed 0.5 mg and Fentanyl 25 mcg was administered intravenously. Moderate Sedation Time: 15 minutes. The patient's level of consciousness and vital signs were monitored continuously by radiology nursing throughout the procedure under my direct supervision. FLUOROSCOPY TIME:  36 seconds (14 mGy) COMPLICATIONS: None immediate. PROCEDURE: Informed written consent was obtained from the patient after a discussion of the risks, benefits, and alternatives to treatment. Questions regarding the procedure were encouraged and answered. The right neck and chest were prepped with chlorhexidine in a sterile fashion, and a sterile drape was applied covering the operative field. Maximum barrier sterile technique with sterile gowns and gloves were used for the procedure. A timeout was performed prior to the initiation of the procedure. After  creating a small venotomy incision, a micropuncture kit was utilized to access the internal jugular vein. Real-time ultrasound guidance was utilized for vascular access including the acquisition of a permanent ultrasound image documenting patency of the accessed vessel. The microwire was utilized to measure appropriate catheter length. A stiff Glidewire was advanced to the level of the IVC and the micropuncture sheath was exchanged for a peel-away sheath. A palindrome tunneled hemodialysis catheter measuring 19 cm from tip to cuff was tunneled in a retrograde fashion from the anterior chest wall to the venotomy incision. The catheter was then placed through the peel-away sheath with tips ultimately positioned within the superior aspect of the right atrium. Final catheter positioning was confirmed and documented with a spot radiographic image. The catheter  aspirates and flushes normally. The catheter was flushed with appropriate volume heparin dwells. The catheter exit site was secured with a 0-Prolene retention suture. The venotomy incision was closed with an interrupted 4-0 Vicryl, Dermabond and Steri-strips. Dressings were applied. The patient tolerated the procedure well without immediate post procedural complication. IMPRESSION: Successful placement of 19 cm tip to cuff tunneled hemodialysis catheter via the right internal jugular vein with tips terminating within the superior aspect of the right atrium. The catheter is ready for immediate use. Electronically Signed   By: Sandi Mariscal M.D.   On: 08/21/2021 14:57   IR US Guide Vasc Access Right  INDICATION: End-stage renal disease. In need durable intravenous access for the initiation hemodialysis.   EXAM: TUNNELED CENTRAL VENOUS HEMODIALYSIS CATHETER PLACEMENT WITH ULTRASOUND AND FLUOROSCOPIC GUIDANCE   MEDICATIONS: The patient is currently admitted to the hospital receiving intravenous antibiotics. The antibiotic was given in an appropriate time interval  prior to skin puncture.   ANESTHESIA/SEDATION: Moderate (conscious) sedation was employed during this procedure. A total of Versed 0.5 mg and Fentanyl 25 mcg was administered intravenously.   Moderate Sedation Time: 15 minutes. The patient's level of consciousness and vital signs were monitored continuously by radiology nursing throughout the procedure under my direct supervision.   FLUOROSCOPY TIME:  36 seconds (14 mGy)   COMPLICATIONS: None immediate.   PROCEDURE: Informed written consent was obtained from the patient after a discussion of the risks, benefits, and alternatives to treatment. Questions regarding the procedure were encouraged and answered. The right neck and chest were prepped with chlorhexidine in a sterile fashion, and a sterile drape was applied covering the operative field. Maximum barrier sterile technique with sterile gowns and gloves were used for the procedure. A timeout was performed prior to the initiation of the procedure.   After creating a small venotomy incision, a micropuncture kit was utilized to access the internal jugular vein. Real-time ultrasound guidance was utilized for vascular access including the acquisition of a permanent ultrasound image documenting patency of the accessed vessel. The microwire was utilized to measure appropriate catheter length.   A stiff Glidewire was advanced to the level of the IVC and the micropuncture sheath was exchanged for a peel-away sheath. A palindrome tunneled hemodialysis catheter measuring 19 cm from tip to cuff was tunneled in a retrograde fashion from the anterior chest wall to the venotomy incision.   The catheter was then placed through the peel-away sheath with tips ultimately positioned within the superior aspect of the right atrium. Final catheter positioning was confirmed and documented with a spot radiographic image. The catheter aspirates and flushes normally. The catheter was flushed with appropriate volume heparin dwells.   The  catheter exit site was secured with a 0-Prolene retention suture. The venotomy incision was closed with an interrupted 4-0 Vicryl, Dermabond and Steri-strips. Dressings were applied. The patient tolerated the procedure well without immediate post procedural complication.   IMPRESSION: Successful placement of 19 cm tip to cuff tunneled hemodialysis catheter via the right internal jugular vein with tips terminating within the superior aspect of the right atrium. The catheter is ready for immediate use.     Electronically Signed   By: Sandi Mariscal M.D.   On: 08/21/2021 14:57  DG Bone Survey Met  Result Date: 08/19/2021 CLINICAL DATA:  Metastatic bone survey. EXAM: METASTATIC BONE SURVEY COMPARISON:  MRI of the abdomen August 17, 2021 FINDINGS: Skull: Normal bony mineralization. No focal lytic or blastic osseous lesion. Cervical Spine: Normal bony  mineralization. No focal lytic or blastic osseous lesion. Thoracic Spine: Normal bony mineralization. No focal lytic or blastic osseous lesion. Chest: Normal bony mineralization. No focal lytic or blastic osseous lesion. Lumbar Spine: Normal bony mineralization. No focal lytic or blastic osseous lesion. Pelvis: Normal bony mineralization. No focal lytic or blastic osseous lesion. Right Upper Extremity: Normal bony mineralization. No focal lytic or blastic osseous lesion. Left Upper Extremity: Normal bony mineralization. No focal lytic or blastic osseous lesion. Right Lower Extremity: Normal bony mineralization. No focal lytic or blastic osseous lesion. Left Lower Extremity: Normal bony mineralization. No focal lytic or blastic osseous lesion. Additional Bilateral pleural effusions. Osteoarthritic changes of the bilateral shoulders. Calcific atherosclerotic disease of the carotid arteries, aorta and femoral arteries. IMPRESSION: No lytic or sclerotic osseous lesions seen throughout the skeletal survey. Electronically Signed   By: Fidela Salisbury M.D.   On: 08/19/2021  14:07   CT BONE MARROW BIOPSY & ASPIRATION  Result Date: 08/22/2021 INDICATION: Monoclonal gammopathy of unknown significance, amyloidosis EXAM: CT GUIDED LEFT ILIAC BONE MARROW ASPIRATION AND CORE BIOPSY Date:  08/22/2021 08/22/2021 10:32 am Radiologist:  M. Daryll Brod, MD Guidance:  CT FLUOROSCOPY TIME:  Fluoroscopy Time: None. MEDICATIONS: 1% lidocaine local ANESTHESIA/SEDATION: 1.0 mg IV Versed; 80 mcg IV Fentanyl Moderate Sedation Time:  27 minute The patient was continuously monitored during the procedure by the interventional radiology nurse under my direct supervision. CONTRAST:  None. COMPLICATIONS: None PROCEDURE: Informed consent was obtained from the patient following explanation of the procedure, risks, benefits and alternatives. The patient understands, agrees and consents for the procedure. All questions were addressed. A time out was performed. The patient was positioned prone and non-contrast localization CT was performed of the pelvis to demonstrate the iliac marrow spaces. Maximal barrier sterile technique utilized including caps, mask, sterile gowns, sterile gloves, large sterile drape, hand hygiene, and Betadine prep. Under sterile conditions and local anesthesia, an 11 gauge coaxial bone biopsy needle was advanced into the left iliac marrow space. Needle position was confirmed with CT imaging. Initially, bone marrow aspiration was performed. Next, the 11 gauge outer cannula was utilized to obtain a left iliac bone marrow core biopsy. Needle was removed. Hemostasis was obtained with compression. The patient tolerated the procedure well. Samples were prepared with the cytotechnologist. No immediate complications. IMPRESSION: CT guided left iliac bone marrow aspiration and core biopsy. Electronically Signed   By: Jerilynn Mages.  Shick M.D.   On: 08/22/2021 10:35   VAS US RENAL ARTERY DUPLEX  Result Date: 08/16/2021 ABDOMINAL VISCERAL Patient Name:  GERBER PENZA [facility]  Date of Exam:   08/16/2021  Medical Rec #: [id number]         Accession #:    [id number] Date of Birth: [birth year]          Patient Gender: M Patient Age:   79 years Exam Location:  [facility] Procedure:      VAS US RENAL ARTERY DUPLEX Referring Phys: [id number] LINDSAY A KRUSKA -------------------------------------------------------------------------------- Indications: AKI High Risk Factors: Hypertension, Diabetes. Limitations: Limited study. MD only wanted the renal veins to assess patency. Comparison Study: No prior studies. Performing Technologist: Oliver Hum RVT  Examination Guidelines: A complete evaluation includes B-mode imaging, spectral Doppler, color Doppler, and power Doppler as needed of all accessible portions of each vessel. Bilateral testing is considered an integral part of a complete examination. Limited examinations for reoccurring indications may be performed as noted.  Duplex Findings:  Technologist observations: The right and left renal veins appear patent.  Summary:  *See table(s) above for measurements and observations.  Diagnosing physician: Harold Barban MD  Electronically signed by Harold Barban MD on 08/16/2021 at 4:50:07 PM.    Final    VAS [place] UPPER EXTREMITY VENOUS DUPLEX  Result Date: 09/01/2021 UPPER VENOUS STUDY  Patient Name:  KEEVON HENNEY [facility]  Date of Exam:   09/01/2021 Medical Rec #: [id number]         Accession #:    [id number] Date of Birth: January 24, 1946          Patient Gender: M Patient Age:   42 years Exam Location:  [facility] Procedure:      VAS [place] UPPER EXTREMITY VENOUS DUPLEX Referring Phys: Burney Gauze --------------------------------------------------------------------------------  Indications: Right arm edema, subclavian catheter Limitations: Poor ultrasound/tissue interface. Comparison Study: No prior studies. Performing Technologist: Darlin Coco RDMS, RVT  Examination Guidelines: A complete evaluation includes B-mode imaging, spectral Doppler, color Doppler, and power Doppler  as needed of all accessible portions of each vessel. Bilateral testing is considered an integral part of a complete examination. Limited examinations for reoccurring indications may be performed as noted.  Right Findings: +----------+------------+---------+-----------+----------+-------+ RIGHT     CompressiblePhasicitySpontaneousPropertiesSummary +----------+------------+---------+-----------+----------+-------+ IJV           Full       Yes       Yes                      +----------+------------+---------+-----------+----------+-------+ Subclavian    Full       Yes       Yes                      +----------+------------+---------+-----------+----------+-------+ Axillary      Full       Yes       Yes                      +----------+------------+---------+-----------+----------+-------+ Brachial      Full                                          +----------+------------+---------+-----------+----------+-------+ Radial        Full                                          +----------+------------+---------+-----------+----------+-------+ Ulnar         Full                                          +----------+------------+---------+-----------+----------+-------+ Cephalic      Full                                          +----------+------------+---------+-----------+----------+-------+ Basilic       Full                                          +----------+------------+---------+-----------+----------+-------+ High bifurcation of the brachial. Dilatation of the valve sinus with thickening of  the valve seen at the brachial veins.  Left Findings: +----------+------------+---------+-----------+----------+-------+ LEFT      CompressiblePhasicitySpontaneousPropertiesSummary +----------+------------+---------+-----------+----------+-------+ Subclavian    Full       Yes       Yes                       +----------+------------+---------+-----------+----------+-------+  Summary:  Right: No evidence of deep vein thrombosis in the upper extremity. No evidence of superficial vein thrombosis in the upper extremity.  Left: No evidence of thrombosis in the subclavian.  *See table(s) above for measurements and observations.  Diagnosing physician: Deitra Mayo MD Electronically signed by Deitra Mayo MD on 09/01/2021 at 12:50:28 PM.    Final    CT RENAL BIOPSY  Result Date: 08/22/2021 INDICATION: Renal failure, nephrotic syndrome, concern for amyloidosis EXAM: ULTRASOUND LEFT RENAL RANDOM CORE BIOPSY MEDICATIONS: 1% LIDOCAINE LOCAL ANESTHESIA/SEDATION: Moderate (conscious) sedation was employed during this procedure. A total of Versed 1 mg and Fentanyl 80 mcg was administered intravenously. Moderate Sedation Time: 27 minutes. The patient's level of consciousness and vital signs were monitored continuously by radiology nursing throughout the procedure under my direct supervision. FLUOROSCOPY TIME:  Fluoroscopy Time: NONE. COMPLICATIONS: None immediate. PROCEDURE: Informed written consent was obtained from the patient after a thorough discussion of the procedural risks, benefits and alternatives. All questions were addressed. Maximal Sterile Barrier Technique was utilized including caps, mask, sterile gowns, sterile gloves, sterile drape, hand hygiene and skin antiseptic. A timeout was performed prior to the initiation of the procedure. Patient position prone. Previous imaging reviewed. Left kidney lower pole was localized and marked. Under sterile conditions and local anesthesia, a 15 gauge coaxial guide needle was advanced to the left kidney lower pole cortex. Needle position confirmed with ultrasound. Images obtained for documentation. 2 16 gauge core biopsies obtained through the access under direct ultrasound of the lower pole cortex. Samples were intact and non fragmented. These were placed in  saline. Needle tract occluded with Gel-Foam. Postprocedure imaging demonstrates no hemorrhage or hematoma. Patient tolerated biopsy well. IMPRESSION: Successful ultrasound left kidney core biopsy Electronically Signed   By: Jerilynn Mages.  Shick M.D.   On: 08/22/2021 10:38   US Abdomen Limited RUQ (LIVER/GB)  Result Date: 08/16/2021 CLINICAL DATA:  Cirrhosis/ascites EXAM: ULTRASOUND ABDOMEN LIMITED RIGHT UPPER QUADRANT COMPARISON:  None. FINDINGS: Gallbladder: No gallstones or wall thickening visualized. No sonographic Murphy sign noted by sonographer. Common bile duct: Diameter: 1.7 mL Liver: Nodular hepatic contour. No focal lesion identified. Decreased parenchymal echogenicity. Portal vein is patent on color Doppler imaging with normal direction of blood flow towards the liver. Other: At least small volume free fluid. At least trace volume right pleural effusion. IMPRESSION: 1. Cirrhosis. Recommend MRI liver protocol for further evaluation of the hepatic parenchyma. When the patient is clinically stable and able to follow directions and hold their breath (preferably as an outpatient) further evaluation with dedicated abdominal MRI should be considered. 2. At least small volume ascites. 3. At least trace volume right pleural effusion. Electronically Signed   By: Iven Finn M.D.   On: 08/16/2021 18:55   IR Paracentesis  Result Date: 08/18/2021 INDICATION: Patient with history of AKI, newly diagnosed cirrhosis, and anasarca. Request for diagnostic only paracentesis with a maximum drain of 100 mL. EXAM: ULTRASOUND GUIDED DIAGNOSTIC PARACENTESIS MEDICATIONS: 10 mL 1% lidocaine COMPLICATIONS: None immediate. PROCEDURE: Informed written consent was obtained from the patient after a discussion of the risks, benefits and alternatives to treatment. A timeout was performed  prior to the initiation of the procedure. Initial ultrasound scanning demonstrates a moderate amount of ascites within the right upper abdominal  quadrant. The right upper abdomen was prepped and draped in the usual sterile fashion. 1% lidocaine was used for local anesthesia. Following this, a 6 Fr Safe-T-Centesis catheter was introduced. An ultrasound image was saved for documentation purposes. The paracentesis was performed. The catheter was removed and a dressing was applied. The patient tolerated the procedure well without immediate post procedural complication. FINDINGS: A total of approximately 100 mL of hazy, yellow fluid was removed. Samples were sent to the laboratory as requested by the clinical team. Per order maximum of 100 mL was removed from right upper quadrant even though there was a moderate amount of ascites noted on ultrasound images remaining. IMPRESSION: Successful ultrasound-guided paracentesis yielding 100 mL of peritoneal fluid. Read by: Narda Rutherford, NP Electronically Signed   By: Miachel Roux M.D.   On: 08/18/2021 11:11   IR THORACENTESIS ASP PLEURAL SPACE W/IMG GUIDE  Result Date: 08/08/2021 INDICATION: Pt with hx of CAD, HLD, HTN, prostate cancer and edema with recent anasarca and fluid overload with pleural effusions. Request for diagnostic and therapeutic thoracentesis. EXAM: ULTRASOUND GUIDED DIAGNOSTIC AND THERAPEUTIC THORACENTESIS MEDICATIONS: 10ml 1% lidocaine COMPLICATIONS: None immediate. PROCEDURE: An ultrasound guided thoracentesis was thoroughly discussed with the patient and questions answered. The benefits, risks, alternatives and complications were also discussed. The patient understands and wishes to proceed with the procedure. Written consent was obtained. Ultrasound was performed to localize and mark an adequate pocket of fluid in the right chest. The area was then prepped and draped in the normal sterile fashion. 1% Lidocaine was used for local anesthesia. Under ultrasound guidance a 6 Fr Safe-T-Centesis catheter was introduced. Thoracentesis was performed. The catheter was removed and a dressing applied.  FINDINGS: A total of approximately 2 L of clear, yellow fluid was removed. Samples were sent to the laboratory as requested by the clinical team. IMPRESSION: Successful ultrasound guided right thoracentesis yielding 2 L of pleural fluid. Electronically Signed   By: Corrie Mckusick D.O.   On: 08/08/2021 13:05      LAB RESULTS: Basic Metabolic Panel: Recent Labs  Lab 08/30/21 0328 08/31/21 0203  NA 133* 131*  K 3.9 4.0  CL 97* 98  CO2 26 25  GLUCOSE 78 76  BUN 38* 47*  CREATININE 4.13* 5.05*  CALCIUM 8.1* 7.9*   Liver Function Tests: Recent Labs  Lab 08/30/21 0328 08/31/21 0203  AST 24 18  ALT 24 19  ALKPHOS 61 66  BILITOT 0.4 0.3  PROT 4.8* 4.5*  ALBUMIN 2.1* 2.0*   No results for input(s): LIPASE, AMYLASE in the last 168 hours. No results for input(s): AMMONIA in the last 168 hours. CBC: Recent Labs  Lab 08/30/21 0328 08/31/21 0203  WBC 8.8 8.3  NEUTROABS 5.0 4.2  HGB 8.9* 8.6*  HCT 27.4* 27.0*  MCV 88.1 89.1  PLT 306 304   Cardiac Enzymes: No results for input(s): CKTOTAL, CKMB, CKMBINDEX, TROPONINI in the last 168 hours. BNP: Invalid input(s): POCBNP CBG: No results for input(s): GLUCAP in the last 168 hours.     Disposition and Follow-up: Discharge Instructions     AMB Referral to [facility]   Complete by: As directed    [facility] team:  Primary Care Provider Benito Mccreedy, MD @ [facility]   Please assign to [place] Coordinator for complex care and disease management follow up calls and assess for further  needs.  Questions please call:   Natividad Brood, RN BSN [facility] Liaison  [phone] business mobile phone Toll free office [phone]  Fax number: [phone] [email or web address]   Reason for Referral: [facility] Disease Management ([employer] payers)   Disease managment services needed: Nurse Case Manager   Diagnoses of:  Heart Failure Kidney  Failure     Expected date of contact: Emergent - 3 Days   Diet - low sodium heart healthy   Complete by: As directed    Discharge instructions   Complete by: As directed    Next hemodialysis on Saturday, 0/22/2022 at [facility] and will need to arrive at 11:20am tomorrow am.  Please arrive at 10:45 on Tuesday to complete paperwork. Normal arrival time is 11:25 for 11:45 chair time.   If the dressing is still on your incision site when you go home, remove it on the third day after your surgery date. Remove dressing if it begins to fall off, or if it is dirty or damaged before the third day.   Complete by: As directed    Increase activity slowly   Complete by: As directed    TREATMENT CONDITIONS   Complete by: As directed    Patient should have CBC & CMP within 7 days prior to chemotherapy administration. NOTIFY MD IF: ANC < 1500, Hemoglobin < 8, PLT < 100,000,  Total Bili > 1.5, Creatinine > 1.5, ALT & AST > 80 or if patient has unstable vital signs: Temperature > 38.5, SBP > 180 or < 90, RR > 30 or HR > 100.        DISPOSITION: Home   DISCHARGE FOLLOW-UP  Follow-up Information     [facility] Follow up.   Specialty: Home Health Services Why: Physical and Occupational Therapy-office to call with visit times. Contact information: [address] [place] [address] [phone]         [facility], [facility] Follow up.   Why: hospital bed Contact information: [address] [place] [address] [phone]         [facility], [facility] Follow up.   Why: Schedule is Tuesday,Thursday, Saturday. Pt to start on 10/22 and needs to arrive at 11:20 am.  Pt will need to arrive at 10:45 on Tuesday, 10/25. Arrival time thereafter will be 11:25 for 11:45 chair time. Contact information: [address] [place] [address] [phone]         Benito Mccreedy, MD. Go on 09/11/2021.   Specialty: Internal Medicine Why: for  hospital follow-up @2 :15PM Contact information: [address] [place] [address] [phone]                  Time coordinating discharge:  59mins   Signed:   Estill Cotta M.D. [facility] 09/01/2021, 1:30 PM

## 2021-09-01 NOTE — Progress Notes (Signed)
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  He gets dialysis Tuesday-Thursday-Saturday.  Henrene Pastor does look good.  He still has a markedly swollen right arm.  He has a dialysis catheter over on the right side.  I do worry about the possibility of a blood clot.  He certainly would be at higher risk for a blood clot secondary to the myeloma diagnosis.  He has done well with treatment.  He has the nausea and vomiting a couple days afterwards but this is resolved.  We will have to try to get him back into the office next week to see about his next round of treatment.  I will try to add Faspro to the protocol.  We did get back the cytogenetics.  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  Again, I do not think that treatment, if successful, will really help with his renal function all that much.  There are no labs back yet today.  I am just glad to see that he is eating before.  Again, the swelling on the right arm just bothers him a little bit.  There is been no fever.  He is urinating okay.  He is having no diarrhea.  Overall, I would say his performance status is probably ECOG 2.  Lattie Haw, MD  Psalm [phone]

## 2021-09-01 NOTE — Progress Notes (Signed)
Upper extremity venous RT study completed.   Please see CV Proc for preliminary results.   Lashan Gluth, RDMS, RVT  

## 2021-09-01 NOTE — Progress Notes (Signed)
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  He has R IJ tunneled HD catheter for the access.  Too much edema for permanent access at this time.  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 Hepatic and cardiac involvement in amyloidosis likely are contributory. Continue HD/UF, as above  #  Urinary tract infection: Per [facility]  #  Anemia of chronic illness: Without overt blood loss, monitor hemoglobin.  No ESA because of myeloma.  Subjective:  Dialysis yesterday with 4L UF Weights down 15kg from peak LEE remains substantial but improved RUE remains edematous, Doppler ordered by onc this AM  Objective Vital signs in last 24 hours: Vitals:   08/31/21 1125 08/31/21 1300 08/31/21 1950 09/01/21 0359  BP: 128/62 130/64 127/60 (!) 142/59  Pulse: 73  68 66  Resp: 11  18 16  Temp:   98 F (36.7 C) 98.8 F (37.1 C)  TempSrc:   Oral Oral  SpO2:   97% 95%  Weight:    105.8 kg  Height:       Weight change: 2.7 kg  Intake/Output Summary (Last 24 hours) at 09/01/2021 1038 Last data filed at 09/01/2021 0846 Gross per 24 hour  Intake 943 ml  Output 4000 ml  Net -3057 ml        Labs: Basic Metabolic Panel: Recent Labs  Lab 08/29/21 0303 08/30/21 0328 08/31/21 0203  NA 134* 133* 131*  K 4.3 3.9 4.0  CL 98 97* 98  CO2 25 26 25  GLUCOSE 161* 78 76  BUN 60* 38* 47*  CREATININE 4.92* 4.13* 5.05*  CALCIUM 8.5* 8.1* 7.9*    Liver Function Tests: Recent Labs  Lab 08/29/21 0303 08/30/21 0328  08/31/21 0203  AST 29 24 18  ALT 25 24 19  ALKPHOS 69 61 66  BILITOT 0.3 0.4 0.3  PROT 4.9* 4.8* 4.5*  ALBUMIN 2.1* 2.1* 2.0*    No results for input(s): LIPASE, AMYLASE in the last 168 hours. No results for input(s): AMMONIA in the last 168 hours. CBC: Recent Labs  Lab 08/28/21 0321 08/29/21 0303 08/30/21 0328 08/31/21 0203  WBC 6.1 6.2 8.8 8.3  NEUTROABS 2.6 3.8 5.0 4.2  HGB 8.2* 9.5* 8.9* 8.6*  HCT 25.2* 28.9* 27.4* 27.0*  MCV 88.7 88.1 88.1 89.1  PLT 263 312 306 304    Cardiac Enzymes: No results for input(s): CKTOTAL, CKMB, CKMBINDEX, TROPONINI in the last 168 hours. CBG: No results for input(s): GLUCAP in the last 168 hours.  Iron Studies: No results for input(s): IRON, TIBC, TRANSFERRIN, FERRITIN in the last 72 hours. Studies/Results: No results found.  Medications: Infusions:  sodium chloride 10 mL/hr at 08/24/21 0012   sodium chloride 10 mL/hr at 08/18/21 2046    Scheduled Medications:  acyclovir  200 mg Oral BID   atorvastatin  40 mg Oral Daily   Chlorhexidine Gluconate Cloth  6 each Topical Daily   Chlorhexidine Gluconate Cloth  6 each Topical Q0600   darbepoetin (ARANESP) injection - DIALYSIS  200 mcg Intravenous Q Tue-HD   ezetimibe    10 mg Oral QPC supper   heparin  5,000 Units Subcutaneous Q8H   ondansetron  8 mg Oral Q12H   pantoprazole  40 mg Oral BID   sodium chloride flush  3 mL Intravenous Q12H   tamsulosin  0.4 mg Oral Daily    have reviewed scheduled and prn medications.  Physical Exam: General: Pleasant male, NAD. Heart:RRR, s1s2 nl Lungs: Diminished in bases, o/w CTAB Abdomen:soft, Non-tender Extremities: Anasarca with edematous both upper ( R >>>L)  and lower (symmetrical) extremities Dialysis Access: Right IJ TDC in place. Exit site clean  Ryan B Sanford 09/01/2021,10:38 AM  LOS: 18 days   

## 2021-09-01 NOTE — Progress Notes (Signed)
   09/01/21 1211  Mobility  Activity Refused mobility (Pt declined stated he is to be D/C)

## 2021-09-01 NOTE — TOC Transition Note (Signed)
Transition of Care [facility]) - CM/SW Discharge Note   Patient Details  Name: Corey Beard MRN: [id number] Date of Birth: 1946/03/31  Transition of Care [facility]) CM/SW Contact:  Zenon Mayo, RN Phone Number: 09/01/2021, 10:23 AM   Clinical Narrative:    Patient is for dc today, per daughter , the hospital bed has been delivered to patient's home. She will transport patient home today.  [name] with [facility] notified that plan is to dc patient today.   Final next level of care: [place] Barriers to Discharge: No Barriers Identified   Patient Goals and CMS Choice Patient states their goals for this hospitalization and ongoing recovery are:: return home CMS Medicare.gov Compare Post Acute Care list provided to:: Patient Choice offered to / list presented to : Adult Children, Patient  Discharge Placement                       Discharge Plan and Services In-house Referral: NA Discharge Planning Services: CM Consult Post Acute Care Choice: Home Health          DME Arranged: Hospital bed DME Agency: [employer] Date DME Agency Contacted: 08/25/21 Time DME Agency Contacted: 1000 Representative spoke with at DME Agency: Adela Lank HH Arranged: OT, PT [place] Agency: [place] Date [place]: 08/22/21 Time [place]: 1250 Representative spoke with at [place]: [place] Determinants of Health ([place]) Interventions     Readmission Risk Interventions No flowsheet data found.

## 2021-09-01 NOTE — Progress Notes (Addendum)
Aware of pt's planned dc for today. Spoke to pt via phone. Pt aware that he will need to start at [facility] tomorrow and will need to arrive at 11:20 tomorrow am. Pt also advised that he will need to arrive at 10:45 on Tuesday to complete paperwork. Pt's normal arrival time is 11:25 for 11:45 chair time. Out-pt HD arrangements added to AVS for pt's records at d/c. Spoke to [place], Quarry manager at [place]. Elmyra Ricks aware pt to d/c today and to start tomorrow. Elmyra Ricks agreeable to plan. Pt voices understanding of plans and has no further questions/concerns at this time.   Melven Sartorius Renal Navigator [phone]  Addendum at 2:33 pm: Contacted PA for orders for clinic.

## 2021-09-02 DIAGNOSIS — N179 Acute kidney failure, unspecified: Secondary | ICD-10-CM | POA: Diagnosis not present

## 2021-09-02 DIAGNOSIS — N2581 Secondary hyperparathyroidism of renal origin: Secondary | ICD-10-CM | POA: Diagnosis not present

## 2021-09-02 DIAGNOSIS — Z992 Dependence on renal dialysis: Secondary | ICD-10-CM | POA: Diagnosis not present

## 2021-09-03 NOTE — TOC Transition Note (Signed)
Transition of care contact from inpatient facility  Date of discharge: 09/01/21 Date of contact: 09/03/21 Method: Phone Spoke to: Patient  Patient contacted to discuss transition of care from recent inpatient hospitalization. Patient was admitted to [facility] from 10/3-10/21/22 with discharge diagnosis of AKI, nephrotic syndrome with anasarca, BL pleural effusions, proteinuria, and hypoalbuminemia  Medication changes were reviewed.  Patient received his 1st OP HD on Saturday 09/02/21. Plan for renal team to f/u with patient next week during his next HD treatment.  Tobie Poet, NP

## 2021-09-04 ENCOUNTER — Other Ambulatory Visit: Payer: Self-pay

## 2021-09-04 NOTE — Patient Outreach (Signed)
[place] [facility]) Care Management  09/04/2021  ABDULAHI SCHOR 1945-12-27 [id number]     Transition of Care Referral  Referral Date: 08/24/2021 Referral [facility] Liaison Date of Admission: Diagnosis: Date of Discharge: 09/01/2021 Facility: [facility]     Case transferred from [facility] on 09/04/2021. Outreach attempt # 1 to patient. No answer after several rings and unable to leave message.    Plan: RN CM will make outreach attempt to patient within 3-4 business days.   [name], RN,BSN,CCM [place] Management Telephonic Care Management Coordinator Direct Phone: [phone] Toll Free: [phone] Fax: [phone]

## 2021-09-05 ENCOUNTER — Telehealth: Payer: Self-pay | Admitting: *Deleted

## 2021-09-05 ENCOUNTER — Encounter: Payer: Self-pay | Admitting: *Deleted

## 2021-09-05 ENCOUNTER — Other Ambulatory Visit: Payer: Self-pay | Admitting: Pharmacist

## 2021-09-05 DIAGNOSIS — N179 Acute kidney failure, unspecified: Secondary | ICD-10-CM | POA: Diagnosis not present

## 2021-09-05 DIAGNOSIS — Z992 Dependence on renal dialysis: Secondary | ICD-10-CM | POA: Diagnosis not present

## 2021-09-05 DIAGNOSIS — N2581 Secondary hyperparathyroidism of renal origin: Secondary | ICD-10-CM | POA: Diagnosis not present

## 2021-09-05 NOTE — Progress Notes (Signed)
Received a message that this patient was trying to schedule follow up appointments from the hospital. This navigator was not aware of this patient. Patient received first treatment on 08/28/2021 and was due for his next treatment 09/04/2021. Dr Marin Olp is currently out of the office.   Was able to make contact with Dr Marin Olp. He would like the patient to come in this Friday for treatment, and then follow up with MD and treatment next Friday. Pharmacy and scheduling notified.   Oncology Nurse Navigator Documentation  Oncology Nurse Navigator Flowsheets 09/05/2021  Navigator Follow Up Date: 09/15/2021  Navigator Follow Up Reason: Follow-up Appointment;Chemotherapy  Navigator Location [facility]  Navigator Encounter Type Appt/Treatment Plan Review  Treatment Initiated Date 08/28/2021  Patient Visit Type MedOnc  Treatment Phase Active Tx  Barriers/Navigation Needs Coordination of Care  Interventions Coordination of Care  Acuity Level 2-Minimal Needs (1-2 Barriers Identified)  Coordination of Care Appts;Other  Time Spent with Patient 60

## 2021-09-06 ENCOUNTER — Other Ambulatory Visit: Payer: Self-pay

## 2021-09-06 NOTE — Patient Outreach (Signed)
[place] [facility]) Care Management  09/06/2021  Corey Beard February 27, 1946 [id number]   Transition of Care Referral   Referral Date: 08/24/2021 Referral [facility] Liaison Date of Discharge: 09/01/2021 Facility: [facility] Initial Assessment   Outreach attempt #2 to patient.Spoke with patient who denies any acute issues or concerns at present.   Social:Patient resides in the home along with spouse who is bedridden. He is independent with ADLs/IADLs. He has been driving himself to medial appts. He denies any recent falls. Patient reports that [facility] was supposed to provide PT but they do not accept is insurance. He was made aware of other agencies that d do accept his insurance. However, patient declined services and reports he does not ned them. He voices that he is from [place] culture where they believe in being independent and self sufficient. He is aware that if he changes hs mind to contact MD office. He voices that he has strong support system that includes his two daughters and extended family/friends.    Conditions: Per chart review, patient has PMH that includes but not limited to HLD, prostate CA, CA, DM, tobacco abuse and obesity. Patient hospitalized from 08/14/21-09/01/2021 for nephrotic syndrome. He was diagnosed with lambda light chain myeloma. He has started chemo txs. He goes this week for first outpt tx session. He had to start HD txs during this admission and has ben tolerating them fairly well per his report. He goes to HD on Tues,Thurs,Sat.   Medications Reviewed Today     Reviewed by Hayden Pedro, RN (Registered Nurse) on 09/06/21 at 1017  Med List Status: <None>   Medication Order Taking? Sig Documenting Provider Last Dose Status Informant  acyclovir (ZOVIRAX) 400 MG tablet [id number]  Take 0.5 tablets (200 mg total) by mouth 2 (two) times daily. Rai, Vernelle Emerald, MD  Active   atorvastatin (LIPITOR) 40 MG tablet  [id number] No Take 1 tablet (40 mg total) by mouth daily. Adrian Prows, MD 08/13/2021 Active Self  ezetimibe (ZETIA) 10 MG tablet [id number] No Take 1 tablet (10 mg total) by mouth daily after supper. Adrian Prows, MD 08/13/2021 Active Self  L-ARGININE-500 PO [id number] No Take 1,000 mg by mouth daily. [provider] 08/13/2021 Active Self  Multiple Vitamins-Minerals (MULTIVITAMIN PO) [id number] No Take 1 tablet by mouth daily. [provider] 08/13/2021 Active Self  ondansetron (ZOFRAN) 8 MG tablet [id number]  Take 1 tablet (8 mg total) by mouth every 8 (eight) hours as needed for nausea or vomiting. Rai, Vernelle Emerald, MD  Active   pantoprazole (PROTONIX) 40 MG tablet [id number]  Take 1 tablet (40 mg total) by mouth 2 (two) times daily before a meal. Rai, Ripudeep K, MD  Active   tamsulosin (FLOMAX) 0.4 MG CAPS capsule [id number] No Take 0.4 mg by mouth daily. [provider] 08/14/2021 Active Self             Fall Risk 08/30/2021 08/30/2021 08/31/2021 09/01/2021 09/06/2021  Falls in the past year? - - - - 0  Was there an injury with Fall? - - - - 0  Fall Risk Category Calculator - - - - 0  Fall Risk Category - - - - Low  Patient Fall Risk Level Moderate fall risk Moderate fall risk Moderate fall risk Moderate fall risk Moderate fall risk  Patient at Risk for Falls Due to - - - - Impaired mobility;Medication side effect;Impaired vision  Fall risk Follow up - - - - Falls evaluation completed;Education provided  Depression screen [facility] 2/9 09/06/2021 07/06/2015  Decreased Interest 0 0  Down, Depressed, Hopeless 0 0  PHQ - 2 Score 0 0    SDOH Screenings   Alcohol Screen: Not on file  Depression (PHQ2-9): Low Risk    PHQ-2 Score: 0  Financial Resource Strain: Not on file  Food Insecurity: No Food Insecurity   Worried About Charity fundraiser in the Last Year: Never true   Ran Out of Food in the Last Year: Never true  Housing: Not on file  Physical Activity: Not on file   Social Connections: Not on file  Stress: Not on file  Tobacco Use: High Risk   Smoking Tobacco Use: Every Day   Smokeless Tobacco Use: Never   Passive Exposure: Not on file  Transportation Needs: No Transportation Needs   Lack of Transportation (Medical): No   Lack of Transportation (Non-Medical): No      Goals Addressed               This Visit's Progress     (THN)Follow My Treatment Plan-Chronic Kidney (pt-stated)        Timeframe:  Long-Range Goal Priority:  High Start Date:   09/06/2021                          Expected End Date: 03/11/2022                       Follow Up Date Nov 2022  Barriers: None    - keep follow-up appointments - keep taking my medicines, even when I feel good -adhere to HD and chemo schedule    Why is this important?   Staying as healthy as you can is very important. This may mean making changes if you smoke, don't exercise or eat poorly.  A healthy lifestyle is an important goal for you.  Following the treatment plan and making changes may be hard.  Try some of these steps to help keep the disease from getting worse.     Notes:   09/06/2021-Patient reports he went for outpt HD txs on Sat and again yesterday. States txs are going fairly well and denies any sxs/concerns. He is to start first outpt chemo tx this Friday.      [facility] and Keep All Appointments (pt-stated)        Timeframe:  Short-Term Goal Priority:  High Start Date:   09/06/2021                          Expected End Date: Nov 2022                       Follow Up Date Nov 2022  Barriers: None    - ask family or friend for a ride - call to cancel if needed - keep a calendar with appointment dates    Why is this important?   Part of staying healthy is seeing the doctor for follow-up care.  If you forget your appointments, there are some things you can do to stay on track.    Notes:   Patient reports that he ah been driving himself to HD txs(T,T,S). He goes for  PCP appt on 09/11/2021 and oncology appt next week      (THN)Optimal Coping (pt-stated)        Timeframe:  Short-Term Goal Priority:  High Start Date:  09/06/2021                           Expected End Date:  Nov 2022                      Barriers: None   -pt will discuss tx plan with medical team -pt will verbalize at least 2-3 ways t cope with new medical diagnoses    Evidence-based guidance:  Explore the patient's and family's understanding of the disease; use open-ended questions to encourage patient and family to share what is important to them.  Ask the patient and family if they would like someone, such as a Contractor, to be with them during the treatment consultation; if so, repeat, summarize and reinforce information.  Include partner in counseling and education sessions; focus on relationship support and preparation for the medical and psychosocial challenges ahead.  Support adjustment to a new normal with focus on maintaining daily life as closely as possible to life before cancer diagnosis.  Express empathy; listen actively by encouraging the patient and family to express feelings, concerns and fears; ask questions and encourage open communication regarding embarrassing or disturbing topics.  Assess for factors that may impact coping or adjustment, such as mental illness, prognosis, lack of social support, disfiguring disease, financial difficulties, or no or insufficient insurance coverage.  Encourage use of individualized coping strategies that may include yoga, spiritual support, distraction, exercise, relaxation, massage and music therapy.  Provide information that will decrease confusion, anxiety and fear; assist in decision-making for coping with treatment, such as potential side effects, return to school or work, and transitions of care.  Assess for overall lifetime stressors which may increase risk of depression, anxiety and posttraumatic stress symptoms.   Explore common risk factors for depression, anxiety or posttraumatic stress, such as personal or family history of depression and poor social support.  Destigmatize depression in cancer by presenting it as a problem requiring treatment, rather than a personal weakness.  Assess and monitor patient's level of emotional distress; consider using a standardized, validated tool.  Assess for depression and risk for suicide or self-harm at intervals based on risk and presentation.  Provide or refer for cognitive behavioral therapy, supportive psychotherapy and psychoeducation; prepare patient for use of pharmacologic therapy to treat depression or anxiety.  Assess for financial burden of cancer treatment, including no or insufficient health insurance, unemployment and poverty.  Refer to financial navigator or Education officer, museum for thorough evaluation and resource assistance.  Assess for caregiver burden by seeking to understand the range of responsibilities expected and the perceived impact of diagnosis on caregiver.  Strategize ways with caregiver to cope with increasing responsibilities and how to access resources, such as respite care.  Provide or refer caregiver for psychoeducation, problem-solving and cognitive restructuring based on risk and presentation.  Collaborate and maintain communication, based on diagnosis and treatment, between patient, caregivers and the interprofessional team.   Notes:   09/06/21-Patient diagnosed with myeloma and renal failure during recent hospital admission. He reports he is coping fairly well and very appreciative "to just be alive everyday that he wakes up." He is in good spirits. He has good support system of family and friends.         Consent: [facility] services reviewed and discussed with patient. Verbal consent for services given.   Plan: RN CM discussed with patient next outreach within a week. Patient agrees to care  plan and follow up. RN CM will send barriers  letter and route encounter to PCP. RN CM will send welcome letter to patient.  [name], RN,BSN,CCM [place] Management Telephonic Care Management Coordinator Direct Phone: [phone] Toll Free: [phone] Fax: [phone]

## 2021-09-07 ENCOUNTER — Other Ambulatory Visit: Payer: Self-pay | Admitting: Family

## 2021-09-07 ENCOUNTER — Other Ambulatory Visit: Payer: Self-pay | Admitting: Hematology & Oncology

## 2021-09-07 DIAGNOSIS — N2581 Secondary hyperparathyroidism of renal origin: Secondary | ICD-10-CM | POA: Diagnosis not present

## 2021-09-07 DIAGNOSIS — E8581 Light chain (AL) amyloidosis: Secondary | ICD-10-CM

## 2021-09-07 DIAGNOSIS — Z992 Dependence on renal dialysis: Secondary | ICD-10-CM | POA: Diagnosis not present

## 2021-09-07 DIAGNOSIS — N179 Acute kidney failure, unspecified: Secondary | ICD-10-CM | POA: Diagnosis not present

## 2021-09-07 DIAGNOSIS — C9000 Multiple myeloma not having achieved remission: Secondary | ICD-10-CM

## 2021-09-07 DIAGNOSIS — N08 Glomerular disorders in diseases classified elsewhere: Secondary | ICD-10-CM

## 2021-09-07 NOTE — Progress Notes (Signed)
Velcade/CTX will be 3 weeks on, 1 week off per Dr. Marin Olp. Day 22 of the careplan for Faspro will be omitted to make the schedule easier on the patient. He will get Faspro on the same days he gets Velcade/CTX.

## 2021-09-08 ENCOUNTER — Inpatient Hospital Stay: Payer: Medicare PPO | Attending: Hematology & Oncology

## 2021-09-08 ENCOUNTER — Inpatient Hospital Stay: Payer: Medicare PPO

## 2021-09-08 ENCOUNTER — Other Ambulatory Visit: Payer: Self-pay | Admitting: *Deleted

## 2021-09-08 ENCOUNTER — Other Ambulatory Visit: Payer: Self-pay

## 2021-09-08 ENCOUNTER — Telehealth: Payer: Self-pay | Admitting: *Deleted

## 2021-09-08 VITALS — BP 131/57 | HR 90 | Temp 97.8°F | Resp 17

## 2021-09-08 DIAGNOSIS — Z79899 Other long term (current) drug therapy: Secondary | ICD-10-CM | POA: Insufficient documentation

## 2021-09-08 DIAGNOSIS — E8581 Light chain (AL) amyloidosis: Secondary | ICD-10-CM

## 2021-09-08 DIAGNOSIS — C9000 Multiple myeloma not having achieved remission: Secondary | ICD-10-CM

## 2021-09-08 DIAGNOSIS — N08 Glomerular disorders in diseases classified elsewhere: Secondary | ICD-10-CM

## 2021-09-08 DIAGNOSIS — Z5112 Encounter for antineoplastic immunotherapy: Secondary | ICD-10-CM | POA: Diagnosis not present

## 2021-09-08 DIAGNOSIS — Z5111 Encounter for antineoplastic chemotherapy: Secondary | ICD-10-CM | POA: Insufficient documentation

## 2021-09-08 LAB — TYPE AND SCREEN
ABO/RH(D): B POS
Antibody Screen: NEGATIVE

## 2021-09-08 LAB — CBC WITH DIFFERENTIAL (CANCER CENTER ONLY)
Abs Immature Granulocytes: 0.03 10*3/uL (ref 0.00–0.07)
Basophils Absolute: 0.1 10*3/uL (ref 0.0–0.1)
Basophils Relative: 1 %
Eosinophils Absolute: 0.2 10*3/uL (ref 0.0–0.5)
Eosinophils Relative: 2 %
HCT: 30.8 % — ABNORMAL LOW (ref 39.0–52.0)
Hemoglobin: 9.8 g/dL — ABNORMAL LOW (ref 13.0–17.0)
Immature Granulocytes: 0 %
Lymphocytes Relative: 26 %
Lymphs Abs: 2.3 10*3/uL (ref 0.7–4.0)
MCH: 29.3 pg (ref 26.0–34.0)
MCHC: 31.8 g/dL (ref 30.0–36.0)
MCV: 92.2 fL (ref 80.0–100.0)
Monocytes Absolute: 1.3 10*3/uL — ABNORMAL HIGH (ref 0.1–1.0)
Monocytes Relative: 15 %
Neutro Abs: 5.1 10*3/uL (ref 1.7–7.7)
Neutrophils Relative %: 56 %
Platelet Count: 279 10*3/uL (ref 150–400)
RBC: 3.34 MIL/uL — ABNORMAL LOW (ref 4.22–5.81)
RDW: 17.1 % — ABNORMAL HIGH (ref 11.5–15.5)
WBC Count: 9 10*3/uL (ref 4.0–10.5)
nRBC: 0 % (ref 0.0–0.2)

## 2021-09-08 LAB — CMP (CANCER CENTER ONLY)
ALT: 14 U/L (ref 0–44)
AST: 22 U/L (ref 15–41)
Albumin: 3 g/dL — ABNORMAL LOW (ref 3.5–5.0)
Alkaline Phosphatase: 89 U/L (ref 38–126)
Anion gap: 6 (ref 5–15)
BUN: 26 mg/dL — ABNORMAL HIGH (ref 8–23)
CO2: 35 mmol/L — ABNORMAL HIGH (ref 22–32)
Calcium: 8.3 mg/dL — ABNORMAL LOW (ref 8.9–10.3)
Chloride: 96 mmol/L — ABNORMAL LOW (ref 98–111)
Creatinine: 4.54 mg/dL (ref 0.61–1.24)
GFR, Estimated: 13 mL/min — ABNORMAL LOW (ref >60)
Glucose, Bld: 95 mg/dL (ref 70–99)
Potassium: 4 mmol/L (ref 3.5–5.1)
Sodium: 137 mmol/L (ref 135–145)
Total Bilirubin: 0.3 mg/dL (ref 0.3–1.2)
Total Protein: 5.4 g/dL — ABNORMAL LOW (ref 6.5–8.1)

## 2021-09-08 LAB — SAMPLE TO BLOOD BANK

## 2021-09-08 LAB — PRETREATMENT RBC PHENOTYPE

## 2021-09-08 MED ORDER — ACYCLOVIR 400 MG PO TABS: 400.0000 mg | ORAL_TABLET | Freq: Two times a day (BID) | ORAL | 5 refills | Status: AC

## 2021-09-08 MED ORDER — MONTELUKAST SODIUM 10 MG PO TABS
10.0000 mg | ORAL_TABLET | Freq: Once | ORAL | Status: AC
  Administered 2021-09-08: 10 mg via ORAL
  Filled 2021-09-08: qty 1

## 2021-09-08 MED ORDER — DIPHENHYDRAMINE HCL 25 MG PO CAPS
50.0000 mg | ORAL_CAPSULE | Freq: Once | ORAL | Status: AC
  Administered 2021-09-08: 50 mg via ORAL
  Filled 2021-09-08: qty 2

## 2021-09-08 MED ORDER — ONDANSETRON HCL 8 MG PO TABS
8.0000 mg | ORAL_TABLET | Freq: Once | ORAL | Status: AC
  Administered 2021-09-08: 8 mg via ORAL
  Filled 2021-09-08: qty 1

## 2021-09-08 MED ORDER — DARATUMUMAB-HYALURONIDASE-FIHJ 1800-30000 MG-UT/15ML ~~LOC~~ SOLN
1800.0000 mg | Freq: Once | SUBCUTANEOUS | Status: AC
  Administered 2021-09-08: 1800 mg via SUBCUTANEOUS
  Filled 2021-09-08: qty 15

## 2021-09-08 MED ORDER — ACETAMINOPHEN 325 MG PO TABS
650.0000 mg | ORAL_TABLET | Freq: Once | ORAL | Status: AC
  Administered 2021-09-08: 650 mg via ORAL
  Filled 2021-09-08: qty 2

## 2021-09-08 MED ORDER — SODIUM CHLORIDE 0.9 % IV SOLN: INTRAVENOUS | Status: DC

## 2021-09-08 MED ORDER — PROCHLORPERAZINE MALEATE 10 MG PO TABS: 10.0000 mg | ORAL_TABLET | Freq: Four times a day (QID) | ORAL | 1 refills | Status: AC | PRN

## 2021-09-08 MED ORDER — BORTEZOMIB CHEMO SQ INJECTION 3.5 MG (2.5MG/ML)
1.3000 mg/m2 | Freq: Once | INTRAMUSCULAR | Status: AC
  Administered 2021-09-08: 3 mg via SUBCUTANEOUS
  Filled 2021-09-08: qty 1.2

## 2021-09-08 MED ORDER — DEXAMETHASONE 4 MG PO TABS
40.0000 mg | ORAL_TABLET | Freq: Once | ORAL | Status: AC
  Administered 2021-09-08: 40 mg via ORAL
  Filled 2021-09-08: qty 10

## 2021-09-08 MED ORDER — LORAZEPAM 0.5 MG PO TABS: 0.5000 mg | ORAL_TABLET | Freq: Four times a day (QID) | ORAL | 0 refills | Status: DC | PRN

## 2021-09-08 MED ORDER — LORAZEPAM 0.5 MG PO TABS
0.5000 mg | ORAL_TABLET | Freq: Four times a day (QID) | ORAL | 0 refills | Status: AC | PRN
Start: 2021-09-08 — End: ?

## 2021-09-08 MED ORDER — SODIUM CHLORIDE 0.9 % IV SOLN
225.0000 mg/m2 | Freq: Once | INTRAVENOUS | Status: AC
  Administered 2021-09-08: 520 mg via INTRAVENOUS
  Filled 2021-09-08: qty 26

## 2021-09-08 NOTE — Telephone Encounter (Signed)
Critical Creatinine of 4.54 reported by Richardson Landry in lab.  Lottie Dawson NP aware.  Ok to treat today per Lottie Dawson NP

## 2021-09-08 NOTE — Progress Notes (Signed)
OK to treat with creat-4.54 per order of S. Eulas Post NP.

## 2021-09-08 NOTE — Progress Notes (Signed)
1415-Pt observed for 2 hours post Darzalex injection and discharged to home with no complaints.

## 2021-09-09 DIAGNOSIS — N179 Acute kidney failure, unspecified: Secondary | ICD-10-CM | POA: Diagnosis not present

## 2021-09-09 DIAGNOSIS — N2581 Secondary hyperparathyroidism of renal origin: Secondary | ICD-10-CM | POA: Diagnosis not present

## 2021-09-09 DIAGNOSIS — Z992 Dependence on renal dialysis: Secondary | ICD-10-CM | POA: Diagnosis not present

## 2021-09-11 ENCOUNTER — Encounter (HOSPITAL_COMMUNITY): Payer: Self-pay

## 2021-09-11 DIAGNOSIS — N179 Acute kidney failure, unspecified: Secondary | ICD-10-CM | POA: Diagnosis not present

## 2021-09-11 LAB — SURGICAL PATHOLOGY

## 2021-09-12 DEATH — deceased

## 2021-09-14 ENCOUNTER — Encounter: Payer: Self-pay | Admitting: *Deleted

## 2021-09-14 ENCOUNTER — Other Ambulatory Visit: Payer: Self-pay

## 2021-09-14 NOTE — Progress Notes (Signed)
Notified by scheduling that patient passed away on October 08, 2021.  Oncology Nurse Navigator Documentation  Oncology Nurse Navigator Flowsheets 09/14/2021  Navigator Follow Up Date: -  Navigator Follow Up Reason: -  Navigation Complete Date: 09/14/2021  Post Navigation: Continue to Follow Patient? 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

## 2021-09-14 NOTE — Patient Outreach (Signed)
[place] [facility]) Care Management  09/14/2021  Corey Beard [birth year] [id number]   Telephone Assessment Transition of Care     Unsuccessful outreach attempt to patient. No answer after several rings and unable to leave voicemail message.       Plan: RN CM will make outreach attempt to patient within 3-4 business days.    [name], RN,BSN,CCM [place] Management Telephonic Care Management Coordinator Direct Phone: [phone] Toll Free: [phone] Fax: [phone]

## 2021-09-15 ENCOUNTER — Inpatient Hospital Stay: Payer: Medicare PPO

## 2021-09-15 ENCOUNTER — Inpatient Hospital Stay: Payer: Medicare PPO | Admitting: Hematology & Oncology

## 2021-09-19 ENCOUNTER — Other Ambulatory Visit: Payer: Self-pay

## 2021-09-19 NOTE — Patient Outreach (Signed)
[place] [facility]) Care Management  09/19/2021  VERONICA GUERRANT 31-Jul-1946 [id number]   Telephone Assessment   Unsuccessful outreach attempt to patient. No answer after several rings.     Plan: RN CM will send unsuccessful outreach letter to patient.  RN CM will make outreach attempt to patient within 3-4 business days.  [name], RN,BSN,CCM [place] Management Telephonic Care Management Coordinator Direct Phone: [phone] Toll Free: [phone] Fax: [phone]

## 2021-09-20 ENCOUNTER — Other Ambulatory Visit: Payer: Self-pay

## 2021-09-20 NOTE — Patient Outreach (Signed)
[place] [facility]) Care Management  09/20/2021  STEVEN BASSO February 09, 1946 [id number]      Telephone Assessment    Unsuccessful outreach attempt to patient.     Plan: RN CM will make outreach attempt to patient within 3-4 wks if no response from letter mailed to patient.  [name], RN,BSN,CCM [place] Management Telephonic Care Management Coordinator Direct Phone: [phone] Toll Free: [phone] Fax: [phone]

## 2021-10-12 ENCOUNTER — Other Ambulatory Visit: Payer: Self-pay

## 2021-10-12 NOTE — Patient Outreach (Signed)
[place] [facility]) Care Management  10/12/2021  Corey Beard 06-24-1946 [id number]   Telephone Assessment    Unsuccessful outreach attempt to patient. No answer after multiple rings.     Plan: RN CM will make outreach attempt within 3-4 business days.   [name], RN,BSN,CCM [place] Management Telephonic Care Management Coordinator Direct Phone: [phone] Toll Free: [phone] Fax: [phone]

## 2021-10-13 ENCOUNTER — Other Ambulatory Visit: Payer: Self-pay

## 2021-10-13 NOTE — Patient Outreach (Signed)
[place] [facility]) Care Management  10/13/2021  Corey Beard 10-Apr-1946 [id number]   Telephone Assessment    Unsuccessful outreach attempt to patient at both numbers listed.    Plan: RN CM will make outreach attempt to patient within 3-4 wks if no return call.   [name], RN,BSN,CCM [place] Management Telephonic Care Management Coordinator Direct Phone: [phone] Toll Free: [phone] Fax: [phone]

## 2021-11-03 ENCOUNTER — Other Ambulatory Visit: Payer: Self-pay

## 2021-11-03 NOTE — Patient Outreach (Signed)
[facility]) Care Management  11/03/2021  Corey Beard Apr 27, 1946 [id number]   Telephone Assessment     Unsuccessful outreach attempt patient. No answer after multiple rings.      Plan: RN CM will make outreach attempt to patient within the month of Jan.   Corey Beard Corey Beard [facility] Management Telephonic Care Management Coordinator Direct Phone: [phone] Toll Free: [phone] Fax: [phone]

## 2021-11-17 ENCOUNTER — Other Ambulatory Visit: Payer: Self-pay

## 2021-11-17 NOTE — Patient Outreach (Signed)
[place] [facility]) Care Management  11/17/2021  Corey Beard [birth year] [id number]   Case Closure    RN CM received notification that patient passed away on 09-15-2021.     Plan: RN CM will close case.   [name], RN,BSN,CCM [place] Management Telephonic Care Management Coordinator Direct Phone: [phone] Toll Free: [phone] Fax: [phone]

## 2021-11-24 ENCOUNTER — Ambulatory Visit: Payer: Self-pay

## 2021-12-28 ENCOUNTER — Ambulatory Visit: Payer: Medicare PPO | Admitting: Cardiology

## 2022-10-18 IMAGING — US US EXTREM LOW VENOUS
1 series · 12 of 24 positions shown · non-contrast
Comparison: None.

CLINICAL DATA: 75-year-old male with bilateral lower extremity
swelling and skin discoloration.

EXAM:
BILATERAL LOWER EXTREMITY VENOUS DUPLEX ULTRASOUND
TECHNIQUE: Gray-scale sonography with graded compression, as well as color
Doppler and duplex ultrasound, were performed to evaluate the deep
and superficial veins of both lower extremities. Spectral Doppler
was utilized to evaluate flow at rest and with distal augmentation
maneuvers. A complete superficial venous insufficiency exam was
performed in the standing position. I personally performed the
technical portion of the exam.

[Series 1: us extrem low venous · 0.08mm/px · 12 of 62 slices shown]
[im 3/62]
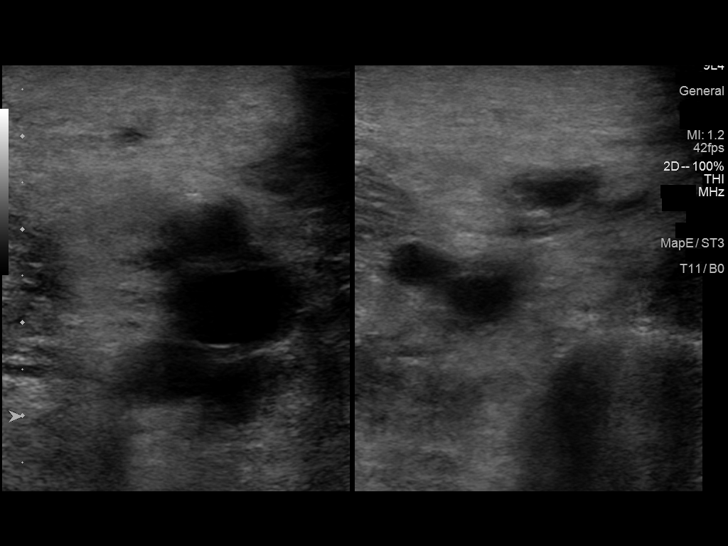
[im 8/62]
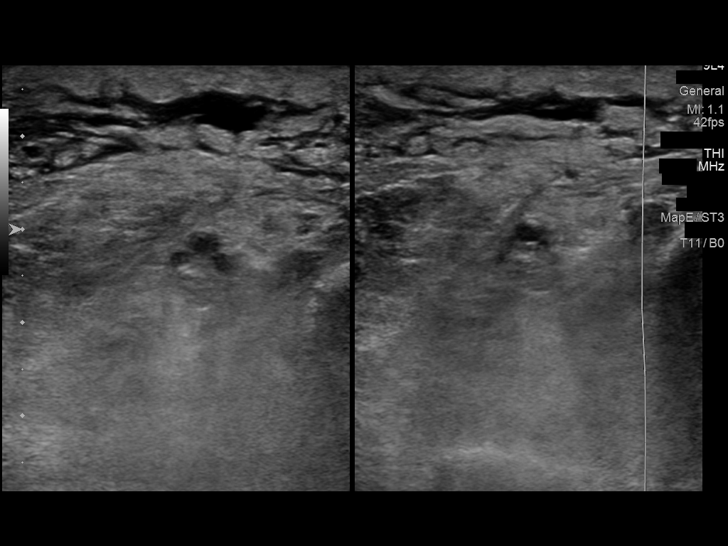
[im 14/62]
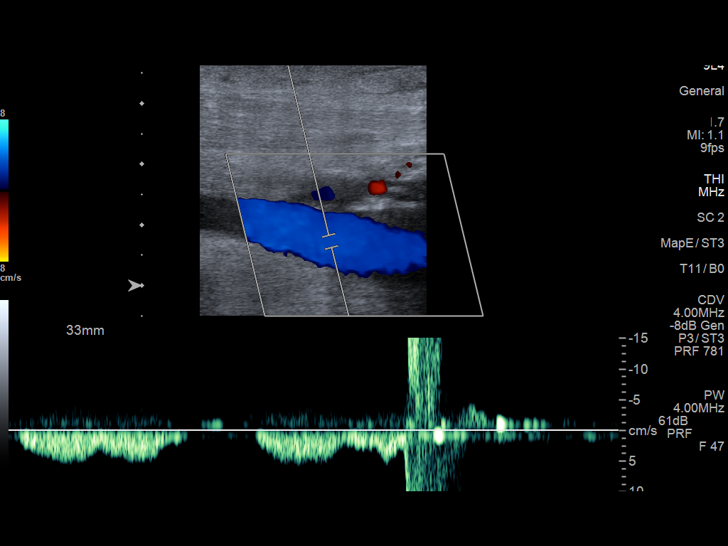
[im 19/62]
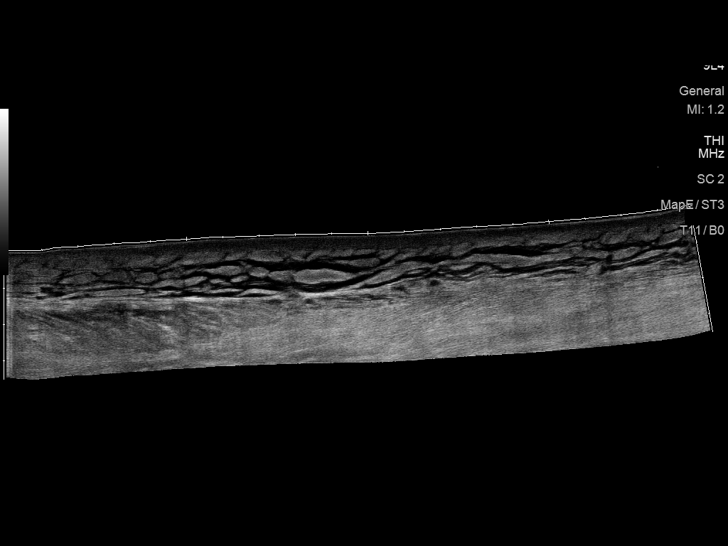
[im 24/62]
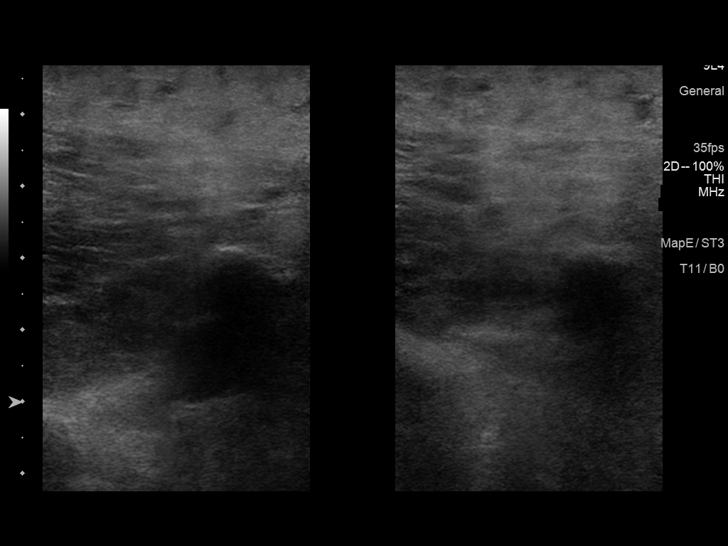
[im 30/62]
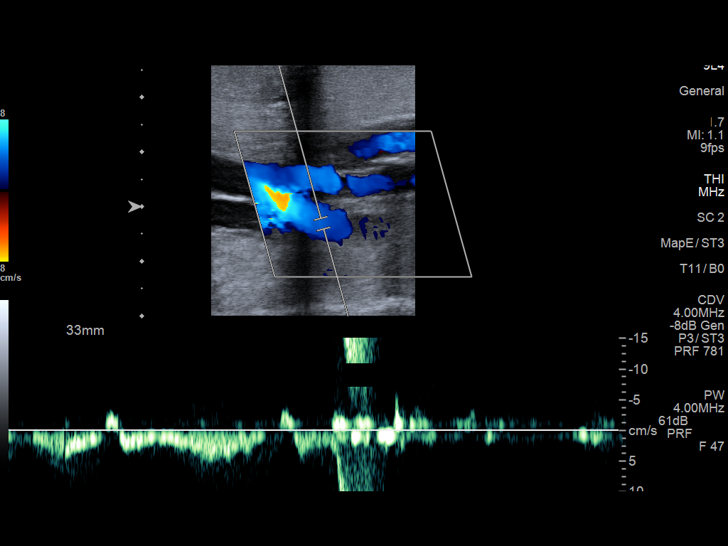
[im 35/62]
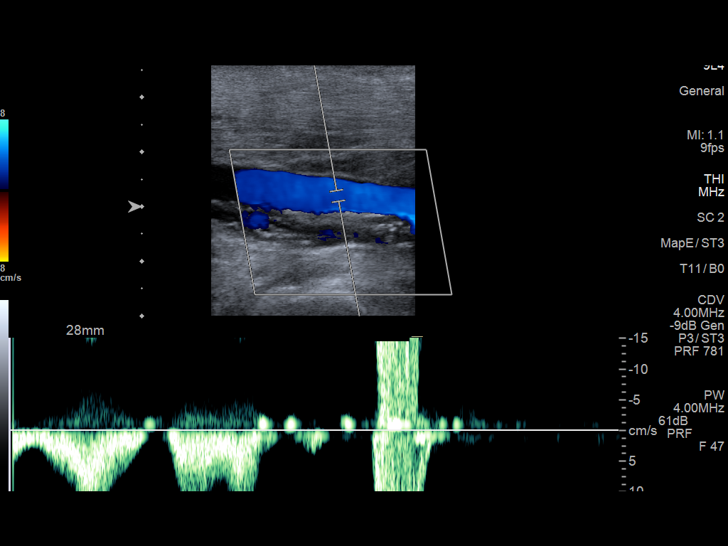
[im 40/62]
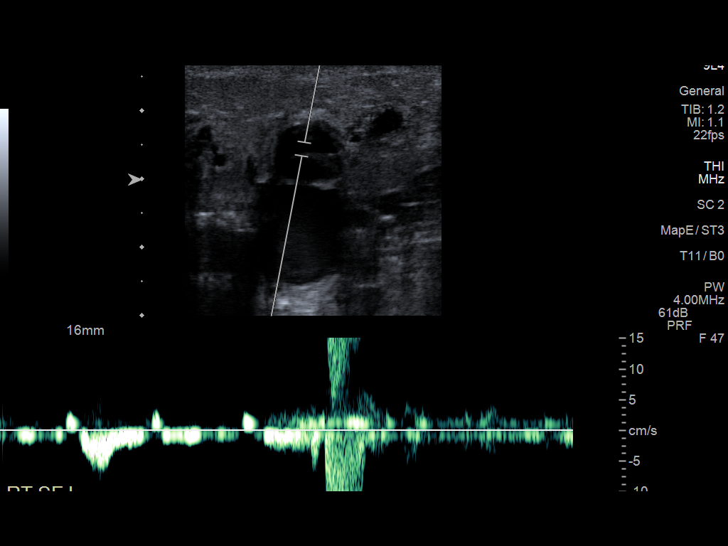
[im 46/62]
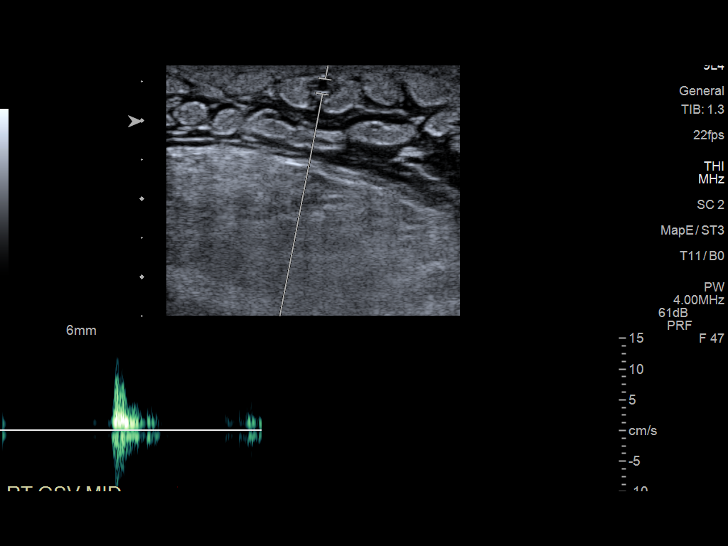
[im 51/62]
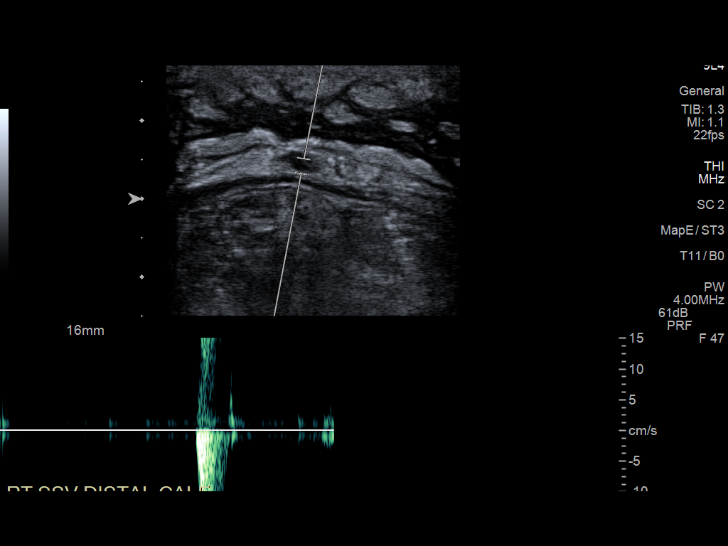
[im 56/62]
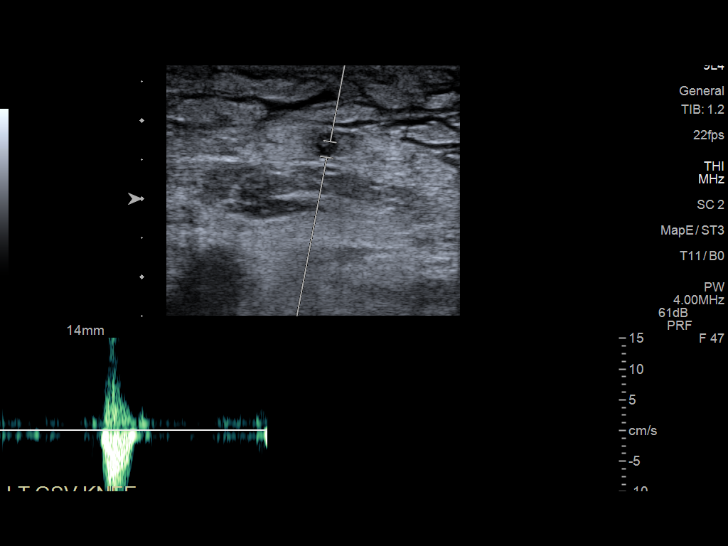
[im 62/62]
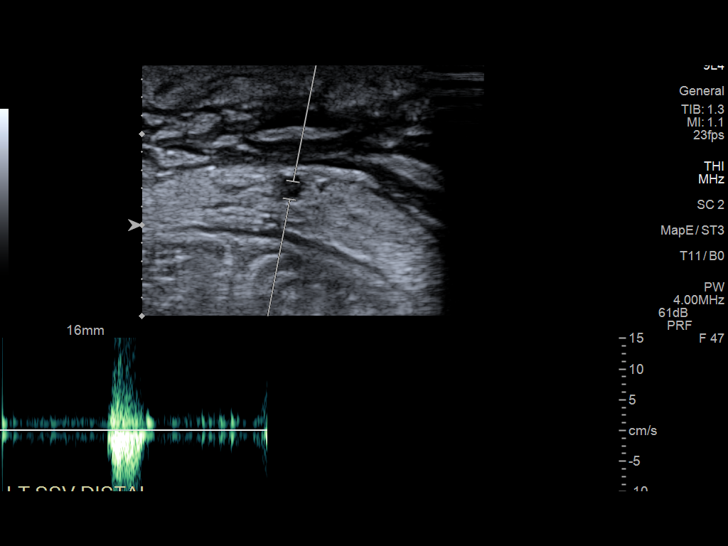

[12 of 24 positions shown; findings below may reference images not displayed]

FINDINGS: RIGHT Deep Venous System:

Evaluation of the deep venous system including the common femoral,
femoral, profunda femoral, popliteal and calf veins (where visible)
demonstrate no evidence of deep venous thrombosis. The vessels are
compressible and demonstrate normal respiratory phasicity and
response to augmentation. No evidence of deep venous reflux.

RIGHT Superficial Venous System:

SFJ: Patent, normal caliber, no evidence of reflux.

GSV Prox Thigh: Patent, normal caliber, no evidence of reflux.

GSV Mid Thigh: Patent, normal caliber, no evidence of reflux.

GSV Lower Thigh: Patent, normal caliber, no evidence of reflux.

GSV Prox Calf: Patent, normal caliber, no evidence of reflux.

GSV Mid Calf: Patent, normal caliber, no evidence of reflux.

GSV Distal Calf: Patent, normal caliber, no evidence of reflux.

SPJ: Patent, normal caliber, no evidence of reflux.

SSV Prox: Patent, normal caliber, no evidence of reflux.

SSV Mid: Patent, normal caliber, no evidence of reflux.

SSV Distal: Patent, normal caliber, no evidence of reflux.

Other: Subcutaneous edema is noted throughout the right lower
extremity.

LEFT Deep Venous System:

Evaluation of the deep venous system including the common femoral,
femoral, profunda femoral, popliteal and calf veins (where visible)
demonstrate no evidence of deep venous thrombosis. The vessels are
compressible and demonstrate normal respiratory phasicity and
response to augmentation. No evidence of deep venous reflux.

LEFT Superficial Venous System:

SFJ: Patent, normal caliber, no evidence of reflux.

GSV Prox Thigh: Patent, normal caliber, no evidence of reflux.

GSV Mid Thigh: Patent, normal caliber, no evidence of reflux.

GSV Lower Thigh: Patent, normal caliber, no evidence of reflux.

GSV Prox Calf: Patent, normal caliber, no evidence of reflux.

GSV Mid Calf: Patent, normal caliber, no evidence of reflux.

GSV Distal Calf: Patent, normal caliber, no evidence of reflux.

SPJ: Patent, normal caliber, no evidence of reflux.

SSV Prox: Patent, normal caliber, no evidence of reflux.

SSV Mid: Patent, normal caliber, no evidence of reflux.

SSV Distal: Patent, normal caliber, no evidence of reflux.

Other: Subcutaneous edema is noted throughout the left lower
extremity.
IMPRESSION: 1. Patent and normal caliber superficial and deep system lower
extremity veins. No evidence of venous insufficiency.
2. Subcutaneous edema is noted throughout the bilateral lower
extremities.

## 2022-12-25 IMAGING — CT CT BIOPSY AND ASPIRATION BONE MARROW
1 of 2 series · 15 of 32 positions shown, 19 images · non-contrast
Comparison: none

INDICATION: Monoclonal gammopathy of unknown significance, amyloidosis

[Series 2: i-spiral 5.0 b40f · axial · 0.88mm/px · z∈[+850,+969]mm · 15 of 38 slices shown, 19 images]
[im 2/38  soft-tissue]
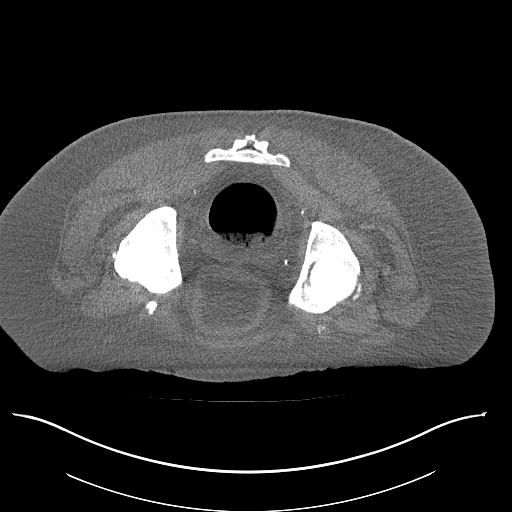
[im 2/38  bone]
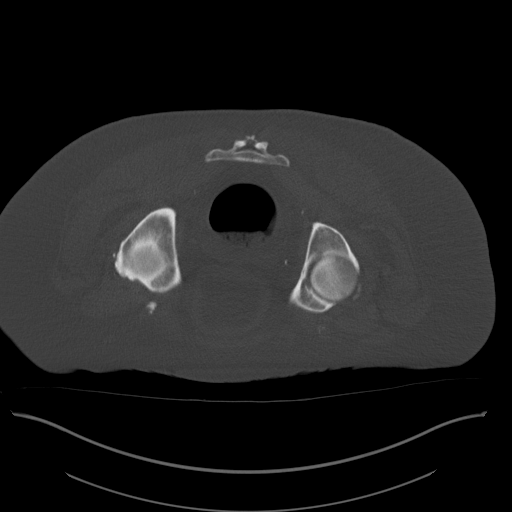
[im 6/38  soft-tissue]
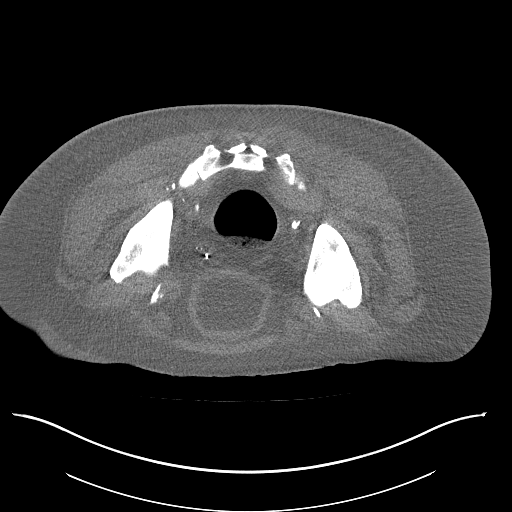
[im 9/38  soft-tissue]
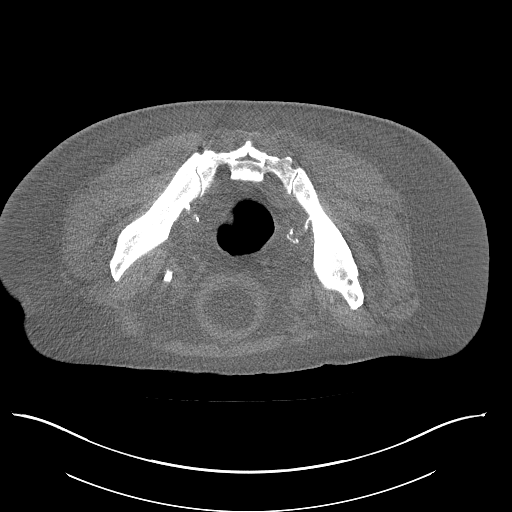
[im 11/38  soft-tissue]
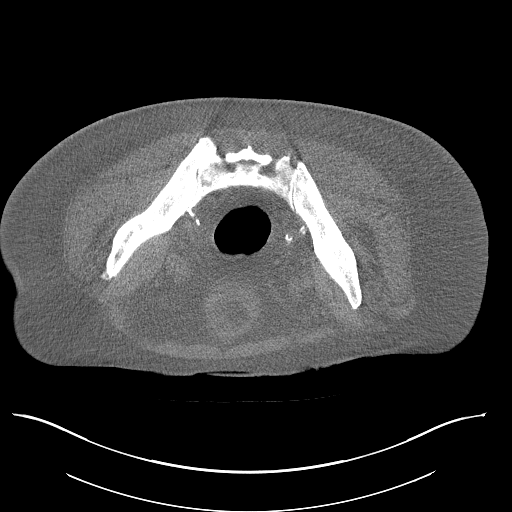
[im 14/38  soft-tissue]
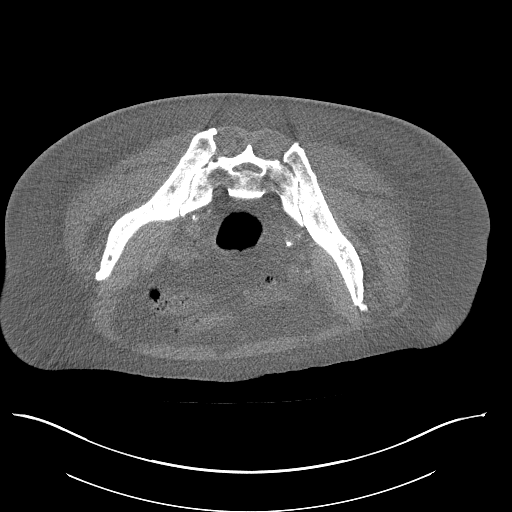
[im 16/38  soft-tissue]
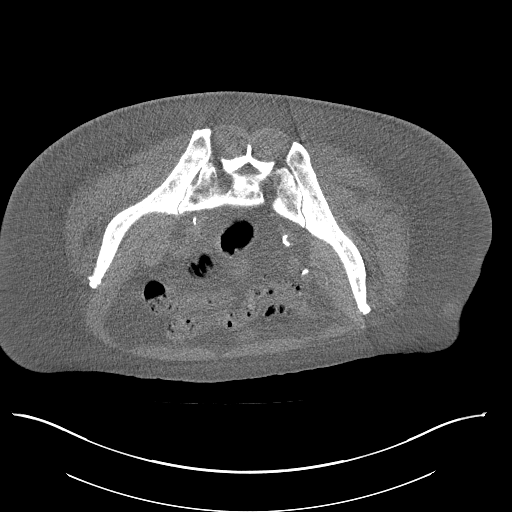
[im 19/38  soft-tissue]
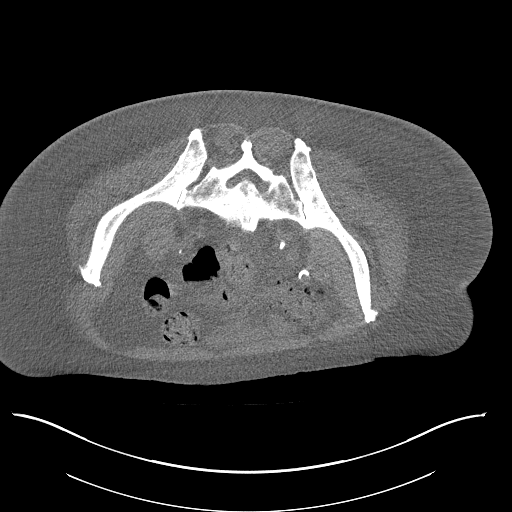
[im 22/38  soft-tissue]
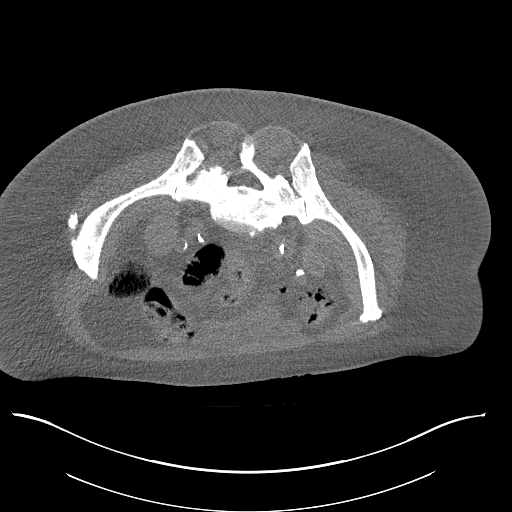
[im 24/38  soft-tissue]
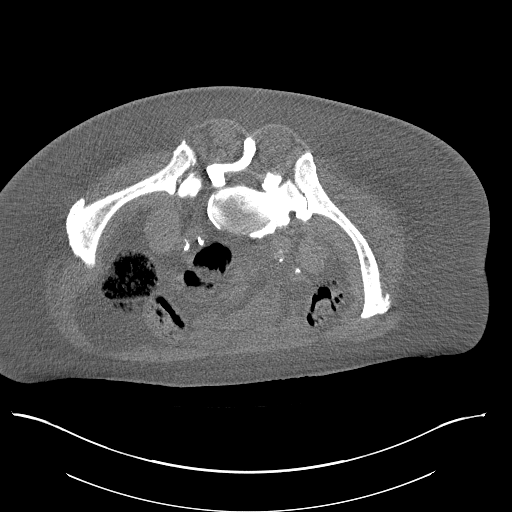
[im 24/38  bone]
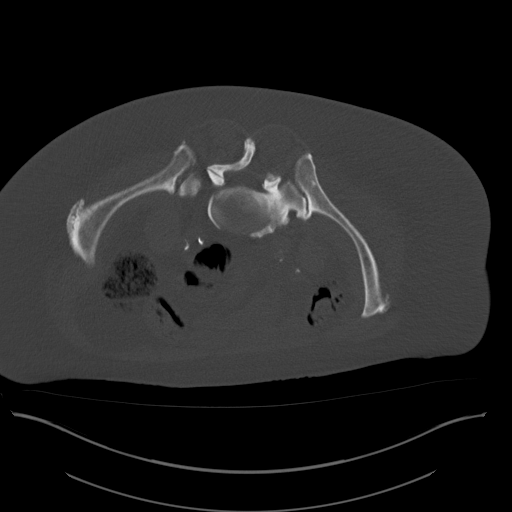
[im 27/38  soft-tissue]
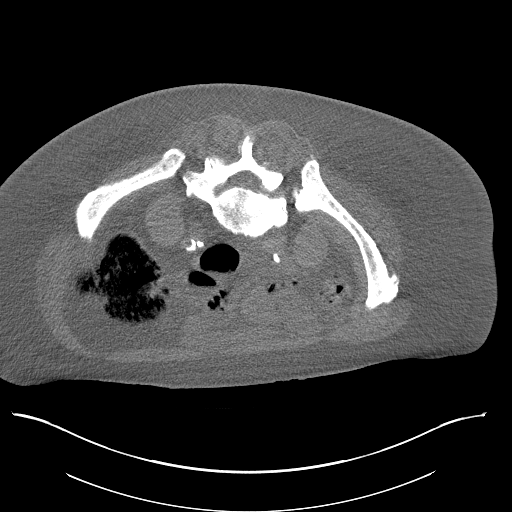
[im 29/38  soft-tissue]
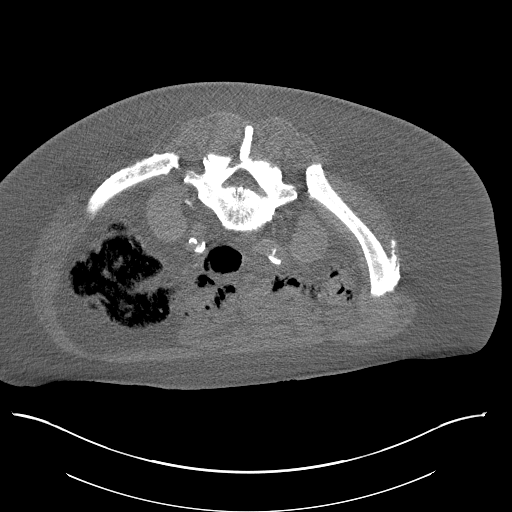
[im 31/38  lung]
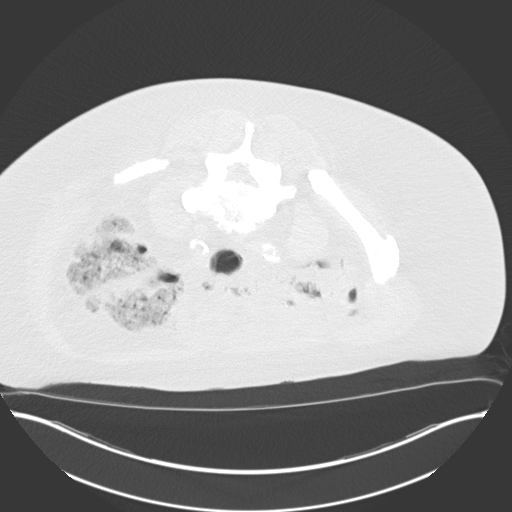
[im 32/38  soft-tissue]
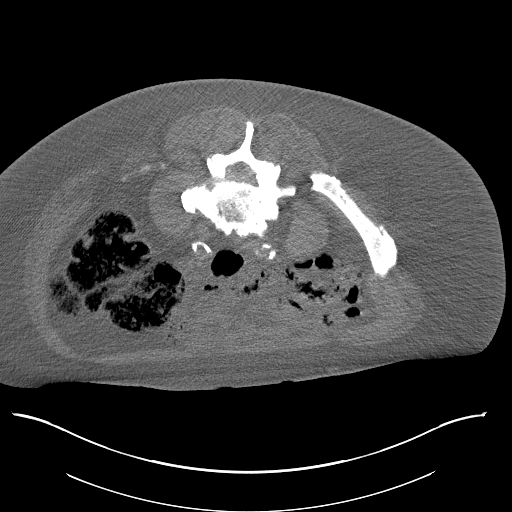
[im 32/38  lung]
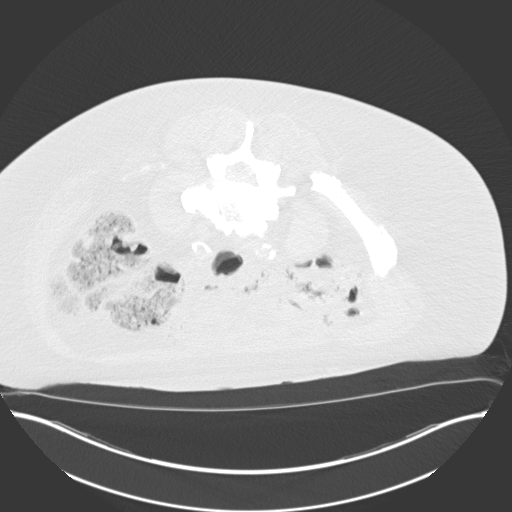
[im 34/38  lung]
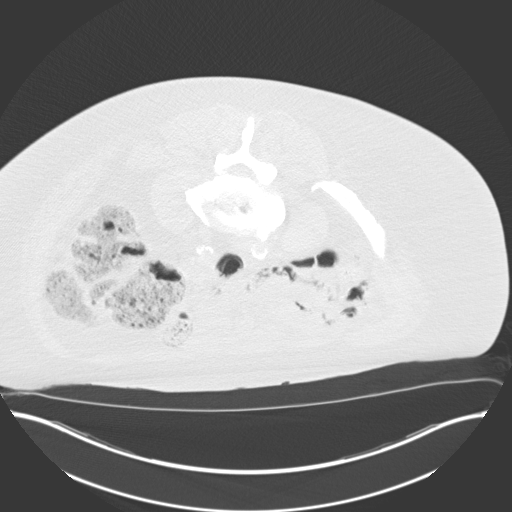
[im 36/38  soft-tissue]
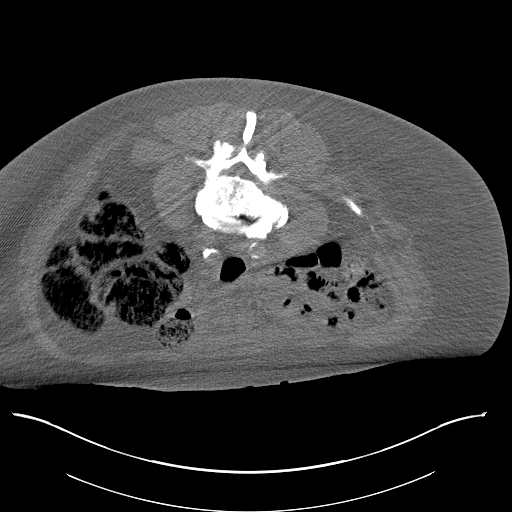
[im 36/38  lung]
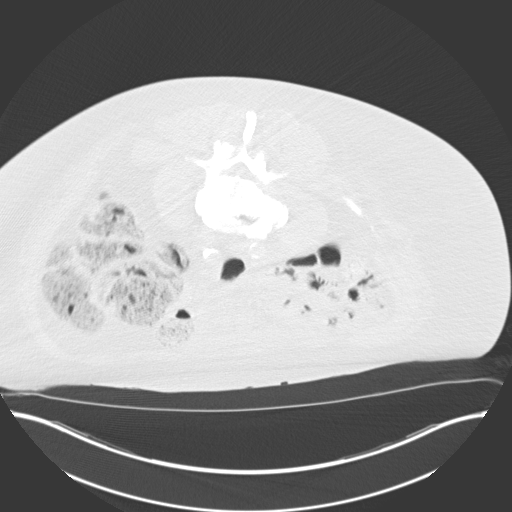

[15 of 32 positions shown; findings below may reference images not displayed]

EXAM:
CT GUIDED LEFT ILIAC BONE MARROW ASPIRATION AND CORE BIOPSY

Radiologist:  Tinaz, Burhaneddin

Guidance:  CT

FLUOROSCOPY TIME:  Fluoroscopy Time: None.

MEDICATIONS:
1% lidocaine local

ANESTHESIA/SEDATION:
1.0 mg IV Versed; 80 mcg IV Fentanyl

Moderate Sedation Time:  27 minute

The patient was continuously monitored during the procedure by the
interventional radiology nurse under my direct supervision.

CONTRAST:  None.

COMPLICATIONS:
None

PROCEDURE:
Informed consent was obtained from the patient following explanation
of the procedure, risks, benefits and alternatives. The patient
understands, agrees and consents for the procedure. All questions
were addressed. A time out was performed.

The patient was positioned prone and non-contrast localization CT
was performed of the pelvis to demonstrate the iliac marrow spaces.

Maximal barrier sterile technique utilized including caps, mask,
sterile gowns, sterile gloves, large sterile drape, hand hygiene,
and Betadine prep.

Under sterile conditions and local anesthesia, an 11 gauge coaxial
bone biopsy needle was advanced into the left iliac marrow space.
Needle position was confirmed with CT imaging. Initially, bone
marrow aspiration was performed. Next, the 11 gauge outer cannula
was utilized to obtain a left iliac bone marrow core biopsy. Needle
was removed. Hemostasis was obtained with compression. The patient
tolerated the procedure well. Samples were prepared with the
cytotechnologist. No immediate complications.
IMPRESSION: CT guided left iliac bone marrow aspiration and core biopsy.
